# Patient Record
Sex: Male | Born: 1970 | Race: Black or African American | Hispanic: No | Marital: Single | Smoking: Never smoker
Health system: Southern US, Community
[De-identification: ages and names within clinical notes are randomized; demographics above are authoritative.]

## PROBLEM LIST (undated history)

## (undated) DIAGNOSIS — Z789 Other specified health status: Secondary | ICD-10-CM

## (undated) DIAGNOSIS — K219 Gastro-esophageal reflux disease without esophagitis: Secondary | ICD-10-CM

## (undated) DIAGNOSIS — I4891 Unspecified atrial fibrillation: Secondary | ICD-10-CM

## (undated) DIAGNOSIS — I10 Essential (primary) hypertension: Secondary | ICD-10-CM

## (undated) DIAGNOSIS — N186 End stage renal disease: Secondary | ICD-10-CM

## (undated) DIAGNOSIS — I480 Paroxysmal atrial fibrillation: Secondary | ICD-10-CM

## (undated) DIAGNOSIS — H9190 Unspecified hearing loss, unspecified ear: Secondary | ICD-10-CM

## (undated) DIAGNOSIS — Z992 Dependence on renal dialysis: Secondary | ICD-10-CM

## (undated) DIAGNOSIS — D649 Anemia, unspecified: Secondary | ICD-10-CM

## (undated) DIAGNOSIS — K5900 Constipation, unspecified: Secondary | ICD-10-CM

## (undated) DIAGNOSIS — N289 Disorder of kidney and ureter, unspecified: Secondary | ICD-10-CM

## (undated) DIAGNOSIS — I499 Cardiac arrhythmia, unspecified: Secondary | ICD-10-CM

## (undated) DIAGNOSIS — R0600 Dyspnea, unspecified: Secondary | ICD-10-CM

## (undated) HISTORY — PX: OTHER SURGICAL HISTORY: SHX169

## (undated) HISTORY — PX: VASCULAR SURGERY: SHX849

---

## 2000-09-22 ENCOUNTER — Encounter: Payer: Self-pay | Admitting: Emergency Medicine

## 2000-09-22 ENCOUNTER — Inpatient Hospital Stay (HOSPITAL_COMMUNITY): Admission: EM | Admit: 2000-09-22 | Discharge: 2000-09-25 | Payer: Self-pay | Admitting: *Deleted

## 2000-09-23 ENCOUNTER — Encounter: Payer: Self-pay | Admitting: *Deleted

## 2000-09-24 ENCOUNTER — Encounter: Payer: Self-pay | Admitting: *Deleted

## 2000-09-25 ENCOUNTER — Encounter: Payer: Self-pay | Admitting: *Deleted

## 2000-10-05 ENCOUNTER — Ambulatory Visit (HOSPITAL_COMMUNITY): Admission: RE | Admit: 2000-10-05 | Discharge: 2000-10-05 | Payer: Self-pay | Admitting: Urology

## 2000-10-05 ENCOUNTER — Encounter: Payer: Self-pay | Admitting: Urology

## 2000-10-28 ENCOUNTER — Emergency Department (HOSPITAL_COMMUNITY): Admission: EM | Admit: 2000-10-28 | Discharge: 2000-10-28 | Payer: Self-pay | Admitting: Emergency Medicine

## 2000-12-12 ENCOUNTER — Encounter: Payer: Self-pay | Admitting: Urology

## 2000-12-12 ENCOUNTER — Ambulatory Visit (HOSPITAL_COMMUNITY): Admission: RE | Admit: 2000-12-12 | Discharge: 2000-12-12 | Payer: Self-pay | Admitting: Urology

## 2001-01-04 ENCOUNTER — Encounter: Payer: Self-pay | Admitting: Urology

## 2001-01-04 ENCOUNTER — Ambulatory Visit (HOSPITAL_COMMUNITY): Admission: RE | Admit: 2001-01-04 | Discharge: 2001-01-04 | Payer: Self-pay | Admitting: Urology

## 2001-04-01 ENCOUNTER — Encounter: Payer: Self-pay | Admitting: Urology

## 2001-04-01 ENCOUNTER — Ambulatory Visit (HOSPITAL_COMMUNITY): Admission: RE | Admit: 2001-04-01 | Discharge: 2001-04-01 | Payer: Self-pay | Admitting: Urology

## 2002-08-09 ENCOUNTER — Encounter (HOSPITAL_COMMUNITY): Admission: RE | Admit: 2002-08-09 | Discharge: 2002-11-07 | Payer: Self-pay | Admitting: Nephrology

## 2002-11-09 ENCOUNTER — Encounter (HOSPITAL_COMMUNITY): Admission: RE | Admit: 2002-11-09 | Discharge: 2003-02-07 | Payer: Self-pay | Admitting: Nephrology

## 2002-11-17 ENCOUNTER — Emergency Department (HOSPITAL_COMMUNITY): Admission: EM | Admit: 2002-11-17 | Discharge: 2002-11-17 | Payer: Self-pay | Admitting: Emergency Medicine

## 2002-11-17 ENCOUNTER — Encounter: Payer: Self-pay | Admitting: Emergency Medicine

## 2003-02-02 ENCOUNTER — Encounter: Payer: Self-pay | Admitting: Emergency Medicine

## 2003-02-02 ENCOUNTER — Emergency Department (HOSPITAL_COMMUNITY): Admission: EM | Admit: 2003-02-02 | Discharge: 2003-02-03 | Payer: Self-pay | Admitting: Emergency Medicine

## 2003-02-15 ENCOUNTER — Encounter (HOSPITAL_COMMUNITY): Admission: RE | Admit: 2003-02-15 | Discharge: 2003-05-16 | Payer: Self-pay | Admitting: Nephrology

## 2003-05-17 ENCOUNTER — Encounter (HOSPITAL_COMMUNITY): Admission: RE | Admit: 2003-05-17 | Discharge: 2003-08-15 | Payer: Self-pay | Admitting: Nephrology

## 2003-08-21 ENCOUNTER — Emergency Department (HOSPITAL_COMMUNITY): Admission: EM | Admit: 2003-08-21 | Discharge: 2003-08-22 | Payer: Self-pay | Admitting: Emergency Medicine

## 2003-10-17 ENCOUNTER — Encounter (HOSPITAL_COMMUNITY): Admission: RE | Admit: 2003-10-17 | Discharge: 2004-01-15 | Payer: Self-pay | Admitting: Nephrology

## 2004-05-03 ENCOUNTER — Emergency Department (HOSPITAL_COMMUNITY): Admission: EM | Admit: 2004-05-03 | Discharge: 2004-05-03 | Payer: Self-pay

## 2004-07-19 ENCOUNTER — Encounter (HOSPITAL_COMMUNITY): Admission: RE | Admit: 2004-07-19 | Discharge: 2004-10-17 | Payer: Self-pay | Admitting: Nephrology

## 2004-10-18 ENCOUNTER — Encounter (HOSPITAL_COMMUNITY): Admission: RE | Admit: 2004-10-18 | Discharge: 2005-01-16 | Payer: Self-pay | Admitting: Nephrology

## 2005-01-24 ENCOUNTER — Encounter (HOSPITAL_COMMUNITY): Admission: RE | Admit: 2005-01-24 | Discharge: 2005-04-24 | Payer: Self-pay | Admitting: Nephrology

## 2005-07-25 ENCOUNTER — Encounter (HOSPITAL_COMMUNITY): Admission: RE | Admit: 2005-07-25 | Discharge: 2005-10-23 | Payer: Self-pay | Admitting: Nephrology

## 2005-08-29 ENCOUNTER — Emergency Department (HOSPITAL_COMMUNITY): Admission: EM | Admit: 2005-08-29 | Discharge: 2005-08-29 | Payer: Self-pay | Admitting: Emergency Medicine

## 2005-10-31 ENCOUNTER — Encounter (HOSPITAL_COMMUNITY): Admission: RE | Admit: 2005-10-31 | Discharge: 2006-01-29 | Payer: Self-pay | Admitting: Nephrology

## 2005-12-06 ENCOUNTER — Emergency Department (HOSPITAL_COMMUNITY): Admission: EM | Admit: 2005-12-06 | Discharge: 2005-12-06 | Payer: Self-pay | Admitting: Emergency Medicine

## 2006-01-06 ENCOUNTER — Ambulatory Visit: Payer: Self-pay | Admitting: Family Medicine

## 2006-01-06 ENCOUNTER — Inpatient Hospital Stay (HOSPITAL_COMMUNITY): Admission: EM | Admit: 2006-01-06 | Discharge: 2006-01-15 | Payer: Self-pay | Admitting: Emergency Medicine

## 2006-01-08 ENCOUNTER — Encounter: Payer: Self-pay | Admitting: Vascular Surgery

## 2006-01-29 ENCOUNTER — Emergency Department (HOSPITAL_COMMUNITY): Admission: EM | Admit: 2006-01-29 | Discharge: 2006-01-30 | Payer: Self-pay | Admitting: *Deleted

## 2007-01-03 ENCOUNTER — Emergency Department (HOSPITAL_COMMUNITY): Admission: EM | Admit: 2007-01-03 | Discharge: 2007-01-03 | Payer: Self-pay | Admitting: *Deleted

## 2007-03-19 ENCOUNTER — Emergency Department (HOSPITAL_COMMUNITY): Admission: EM | Admit: 2007-03-19 | Discharge: 2007-03-19 | Payer: Self-pay | Admitting: Emergency Medicine

## 2007-10-01 ENCOUNTER — Emergency Department (HOSPITAL_COMMUNITY): Admission: EM | Admit: 2007-10-01 | Discharge: 2007-10-02 | Payer: Self-pay | Admitting: Emergency Medicine

## 2008-10-23 ENCOUNTER — Emergency Department (HOSPITAL_COMMUNITY): Admission: EM | Admit: 2008-10-23 | Discharge: 2008-10-23 | Payer: Self-pay | Admitting: Emergency Medicine

## 2010-08-03 ENCOUNTER — Emergency Department (HOSPITAL_COMMUNITY)
Admission: EM | Admit: 2010-08-03 | Discharge: 2010-08-03 | Payer: Self-pay | Source: Home / Self Care | Admitting: Emergency Medicine

## 2010-08-05 LAB — COMPREHENSIVE METABOLIC PANEL
ALT: 31 U/L (ref 0–53)
AST: 22 U/L (ref 0–37)
Albumin: 3.9 g/dL (ref 3.5–5.2)
Alkaline Phosphatase: 55 U/L (ref 39–117)
BUN: 66 mg/dL — ABNORMAL HIGH (ref 6–23)
CO2: 23 mEq/L (ref 19–32)
Calcium: 10 mg/dL (ref 8.4–10.5)
Chloride: 97 mEq/L (ref 96–112)
Creatinine, Ser: 19.58 mg/dL — ABNORMAL HIGH (ref 0.4–1.5)
GFR calc Af Amer: 3 mL/min — ABNORMAL LOW (ref >60)
GFR calc non Af Amer: 3 mL/min — ABNORMAL LOW (ref >60)
Glucose, Bld: 113 mg/dL — ABNORMAL HIGH (ref 70–99)
Potassium: 5.2 mEq/L — ABNORMAL HIGH (ref 3.5–5.1)
Sodium: 136 mEq/L (ref 135–145)
Total Bilirubin: 0.8 mg/dL (ref 0.3–1.2)
Total Protein: 7.3 g/dL (ref 6.0–8.3)

## 2010-08-05 LAB — DIFFERENTIAL
Basophils Absolute: 0 10*3/uL (ref 0.0–0.1)
Basophils Relative: 0 % (ref 0–1)
Eosinophils Absolute: 0.1 10*3/uL (ref 0.0–0.7)
Eosinophils Relative: 1 % (ref 0–5)
Lymphocytes Relative: 8 % — ABNORMAL LOW (ref 12–46)
Lymphs Abs: 0.8 10*3/uL (ref 0.7–4.0)
Monocytes Absolute: 0.7 10*3/uL (ref 0.1–1.0)
Monocytes Relative: 7 % (ref 3–12)
Neutro Abs: 7.9 10*3/uL — ABNORMAL HIGH (ref 1.7–7.7)
Neutrophils Relative %: 84 % — ABNORMAL HIGH (ref 43–77)

## 2010-08-05 LAB — CBC
HCT: 30.5 % — ABNORMAL LOW (ref 39.0–52.0)
Hemoglobin: 9.6 g/dL — ABNORMAL LOW (ref 13.0–17.0)
MCH: 27.1 pg (ref 26.0–34.0)
MCHC: 31.5 g/dL (ref 30.0–36.0)
MCV: 86.2 fL (ref 78.0–100.0)
Platelets: 170 10*3/uL (ref 150–400)
RBC: 3.54 MIL/uL — ABNORMAL LOW (ref 4.22–5.81)
RDW: 13.5 % (ref 11.5–15.5)
WBC: 9.5 10*3/uL (ref 4.0–10.5)

## 2010-12-06 NOTE — Discharge Summary (Signed)
[place]  Patient:    John Woodard, John Woodard                         MRN: [id number] Adm. Date:  [id number] Disc. Date: [id number] Attending:  Hanley Ben CC:         Janit Pagan., M.D.   Discharge Summary  DISCHARGE DIAGNOSES: 1. Urinary retention. 2. Bilateral hydronephrosis. 3. Renal insufficiency. 4. Hematuria. 5. Neurogenic bladder.  PROCEDURE DONE:  Cystoscopy and insertion of suprapubic _cystocath on September 25, 2000.  HISTORY OF PRESENT ILLNESS:  Patient is a 40 year old male who was seen by Dr. Leory Plowman and he had a TUR of median bar in 1998.  For the past few days, he had been complaining of increasing abdominal pain with hematuria and nausea. Patient is mildly mentally retarded.  He has a #2440 catheter in the bladder that drained 800 cc of bloody urine at insertion.  A CT scan of the abdomen and pelvis and renal ultrasound showed bilateral hydronephrosis with thinning of the renal cortex, left worse than right.  Patients creatinine on September 21, 2000 was 3.9 and it has gone down now to 2.5 on September 24, 2000.  PHYSICAL EXAMINATION:  ABDOMEN:  His abdomen is soft, nondistended, no CVA tenderness.  Liver, spleen and kidneys not palpable.  GENITAL:  Penis is circumcised and meatus is normal.  Testicles are descended. Cord structures and epididymis are normal.  EXTREMITIES:  He has weakness and spasticity of his lower extremities at times.  RECTAL:  His sphincter tone is normal, his prostate is smooth, flat, and nontender.  LABORATORY DATA:  His BUN on admission was 24, creatinine was 3.9; with catheter drainage his BUN went down to 22 and creatinine 2.5.  His potassium was 5.1 and went down to 3.9.  Glucose was 100 on admission but was 124 on September 24, 2000.  His urinalysis showed 0-5 wbcs and few bacteria.  It is nitrite positive and had large amount of blood.  Urine culture showed greater than 100,000 colonies of viridans  Streptococcus.  HOSPITAL COURSE:  Patient was treated with Cipro.  His urine cleared up.  On September 25, 2000, he had a cystoscopy that showed no evidence of urethral stricture of bladder neck obstruction.  A suprapubic catheter was then inserted in the bladder.  Patient was then discharged home on Cipro 250 mg p.o. twice a day and he will be followed as an outpatient.  Urodynamics will be done to find out the cause of his neurogenic bladder.  He will also be evaluated by neurology.  CONDITION ON DISCHARGE:  Improved.  DISCHARGE DIET:  Regular.  ACTIVITIES:  Patient may resume his prehospital activities. DD:  09/25/00 TD:  09/27/00 Job: [id number]

## 2010-12-06 NOTE — Op Note (Signed)
[facility]  Patient:    John Woodard, John Woodard Visit Number: [id number] MRN: [id number]          Service Type: DSU Location: DAY Attending Physician:  Hanley Ben Proc. Date: 04/01/01 Admit Date:  04/01/2001                             Operative Report  PREOPERATIVE DIAGNOSES:  Bilateral hydronephrosis, neurogenic bladder, urethral stricture.  POSTOPERATIVE DIAGNOSES:  Bilateral hydronephrosis, neurogenic bladder, urethral stricture.  PROCEDURE DONE:  Cystoscopy, replacement of suprapubic Microvasive catheter, replacement of bilateral double-J catheter, visual urethrotomy, and insertion of UroLume prosthesis.  SURGEON:  Hanley Ben, M.D.  ANESTHESIA:  General.  INDICATION:  The patient is a 40 year old male, who has bilateral hydronephrosis secondary to neurogenic bladder.  He also has the urethral stricture that was diagnosed at the last cystoscopy.  He had a suprapubic catheter inserted for neurogenic bladder and bilateral double-J catheters for bilateral hydronephrosis.  He is scheduled today for replacement of the suprapubic catheter, the double-J catheters, and insertion of a UroLume prosthesis.  DESCRIPTION OF PROCEDURE:  Under general anesthesia, the patient was prepped and draped and placed in the dorsal lithotomy position.  A #22 Wappler cystoscope was inserted in the urethra.  There is a stricture in the bulbous urethra, and the cystoscope could not be passed in the bladder.  The cystoscope was then removed.  A visual urethrotome was then passed in the urethra, and the stricture was incised, and the urethrotome could be passed in the bladder.  The urethrotome was then removed.  A #22 cystoscope was then passed in the bladder.  There is a double-J catheter coming out of each ureteral orifice.  The right double-J catheter was pulled out of the ureter with the grasping forceps, and both catheters came out at the same time.   A guidewire was then passed through the right double-J catheter, but the proximal end of both catheters were in the bladder.  The catheters were then removed.  The cystoscope was reinserted in the bladder.  A Glidewire was passed through an open-end catheter and passed in the ureter.  The open-end catheter was then removed.  A #6 [place] - 26 double-J catheter was passed over the Glidewire into the right ureter.  The same procedure was done on the left side.  The proximal end of the double-J catheter is in the collecting system of each kidney.  Then the [place] catheter was replaced with a #14 [place] Microvasive catheter.  The bladder was then emptied and the cystoscope removed.  The urethra was then dilated up to a #28 [place].  Then the cystoscope was reinserted in the bladder.  The stricture was found to be about 1.5 cm long.  Then a 3 cm UroLume prosthesis instrument was then passed in the urethra, and the prosthesis was then inserted in the urethra distal to the external sphincter and in normal tissues.  The UroLume instrument was then removed.  A #17 cystoscope was then passed in the urethra through the prosthesis without difficulty and into the bladder.  The bladder was then emptied, and the cystoscope was removed.  The prosthesis appears to be in a good location, and each end of the prosthesis is in normal, healthy tissue.  The patient tolerated the procedure well and left the OR in satisfactory condition to postanesthesia care unit. Attending Physician:  Hanley Ben DD:  04/01/01 TD:  04/01/01 Job: [id number]  [id number]

## 2010-12-06 NOTE — Consult Note (Signed)
NAME:  John Woodard, John Woodard NO.:  [id number]   MEDICAL RECORD NO.:  [id number]          PATIENT TYPE:  INP   LOCATION:  5501                         FACILITY:  [facility]   PHYSICIAN:  Felizardo Hoffmann, M.D.  DATE OF BIRTH:  16-Sep-1970   DATE OF CONSULTATION:  01/14/2006  DATE OF DISCHARGE:                                   CONSULTATION   REASON FOR PRIORITY:  The patient is requiring disposition from the hospital  today.   Mr. Struss reports mild improvement in his mood.  His outlook is positive.  He  is able to enjoy some things.  He is not having any adverse Lexapro effects.  There are no auditory or visual hallucinations, no delusions, no thoughts of  harming himself, no thoughts of harming others.   LABORATORY DATA:  CBC is remarkable for his decreased hemoglobin of 9.5, BUN  82, creatinine 10.7.  The patient is continuing with dialysis.   MEDICATIONS:  Reviewed.  The psychotropic on the list is Lexapro at 5 mg  q.a.m.   PHYSICAL EXAMINATION:  VITAL SIGNS:  Temperature 98.3, pulse 82, respiration  18, blood pressure 125/71, O2 saturation 97% on room air.   MENTAL STATUS EXAM:  John Woodard initially does not recognize me, and over the  course of several minutes, he does recall that I had to ask him previously  about his mood and about his depression.  He did not recall that I had  started him on an anti-depressant.  He is oriented to all spheres.  His  speech is slightly slowed and of flat prosody.  He does have a pleasant  social smile, and he is cooperative with 50% eye contact.  Thought process  is coherent.  Though content:  No thoughts of harming himself.  No thoughts  of harming others.  No hallucinations, no delusions.  His fund of knowledge  and intelligence continue stable at an estimated IQ of approximately 12.  He  continues to have a concrete operations level view of healthcare treatment.  He recognizes the need for the treatment of acutely painful  situations.   JUDGMENT:  John Woodard lacks the ability to appreciate his general medical  problems and the long-term potential morbidity and mortality that can occur  with them.  He lacks the ability to appreciate that he needs ongoing  medication, and this is a position that continues to be stable, despite  several days in the hospital.   His insight is partially intact to his depression and the associated  symptoms.  His concentration is mildly decreased with some distractibility.   ASSESSMENT:  Axis I:  Mood disorder, not otherwise specified, 293.83  (history of functional oppression in the context of Axis III illness that  can undermine energy).  Major depressive disorder, recurrent, in partial  remission (provisional).  Axis II:  MR.  Axis III:  See general medical problems list.  Axis IV:  General medical and primary support group.  Axis V:  Stable at 45.   The patient's lack of capacity for long-term independent self care is stable  and secondary to his MR.   RECOMMENDATIONS:  1.  It is recommended that he live in an environment where someone is with      him that can help him maintain his prophylactic medical treatment.  2.  Please ask the case manager to set Mr. Sotto up with psychiatric      outpatient followup.  Options include the [facility], __________  [facility]      and [facility].  Another option      would be to call [phone] and ask the intake department what other      community options are out there with the patient's insurance panel.  3.  Would increase the Lexapro to 10 mg p.o. q.a.m. for his anti-depression      trial.      Felizardo Hoffmann, M.D.  Electronically Signed     JW/MEDQ  D:  01/14/2006  T:  01/14/2006  Job:  [id number]

## 2010-12-06 NOTE — Discharge Summary (Signed)
John Woodard, [name] NO.:  [id number]   MEDICAL RECORD NO.:  [id number]          PATIENT TYPE:  INP   LOCATION:  5501                         FACILITY:  [facility]   PHYSICIAN:  John Chol, MD DATE OF BIRTH:  June 26, 1971   DATE OF ADMISSION:  01/06/2006  DATE OF DISCHARGE:  01/14/2006                                 DISCHARGE SUMMARY   DISCHARGE DIAGNOSES:  1.  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  2.  Suprapubic catheter.  3.  Urinary tract infection.  4.  Status post arteriovenous fistula placement.  5.  Mental retardation.  6.  Hypertension.  7.  Secondary hyperparathyroidism.  8.  Hyperphosphatemia.  9.  Anemia, iron deficiency.  10. Depression.  11. Nephrotic syndrome.  12. Neurogenic bladder.   PROCEDURES:  1.  Diatek insertion on January 07, 2006.  2.  AV fistula on January 09, 2006.  3.  Chest x-ray after Diatek placement showing appropriate placement but      possible pneumothorax.  4.  Follow-up chest x-ray showing no pneumothorax.  5.  Renal ultrasound showing echogenic kidneys and thickening of the bladder      walls.  6.  Head CT showing no intracranial abnormality.   LABORATORY RESULTS:  Creatinine on admission was 12.7.  His potassium at  that time was also 4.8.  Albumin 2.8, calcium 8.9, phosphorus at discharge  6.9, hemoglobin 9.5.  Discharge iron studies included iron of 42, TIBC of  333, percent sat of 13.  Urine culture showed Enterobacter cloacae that was  Cipro-sensitive.  CK was 319.  Hepatitis B was negative.  Liver function  tests were all normal.   DISCHARGE MEDICATIONS:  1.  Micardis 80 mg, one tab before bed.  2.  Lexapro 10 mg, one tab daily.  3.  Norvasc 10 mg, one tab before bed.  4.  Cipro 250 mg, one tab daily until January 18, 2006.  5.  _____, one _____.  6.  Nephro-Vite, one tab daily.  7.  Aranesp 200 mcg at dialysis on Wednesday.  8.  Hectorol 2 mcg Monday, Wednesday,  and Friday with dialysis IV.  9.  PhosLo _____ mg by mouth three times daily.  10. Iron dextran 100 mg Monday, Wednesday, and Friday with dialysis IV.  11. [employer] dietary supplement three times daily.  12. Percocet 5/325 mg, one tab every four hours as needed.   HOSPITAL COURSE:  A 40 year old [place] American male with chronic renal  failure secondary to obstructive process and chronic suprapubic Foley  catheter who presented in acute on chronic renal failure.  Renal was  consulted, and it was determined that the patient had become end-stage renal  disease, and he was started on hemodialysis.  The patient had a Diatek  catheter inserted on January 07, 2006 and an AV fistula created on January 09, 2006.  He was found to be hyperphosphatemic and to have secondary  hyperparathyroidism, so was started on PhosLo and Nephro-Vite and Hectorol  and iron dextran three times a  week with dialysis IV.  He gets Aranesp for  iron-deficiency anemia and renal-related anemia on Wednesdays, 200 mcg.  He  was also found to have a urinary tract infection.  He had been complaining  of some abdominal symptoms and some pressure and some leaking.  His catheter  was replaced, and immediate cath sample grew out Enterobacter cloacae as  above.  It was sensitive to Cipro.  The patient's blood pressure was managed  with Micardis and Norvasc and was still under poor control and will probably  need addition of beta blocker or other medication in the outpatient setting.  The patient was evaluated by Dr. Rhona Woodard with psychiatry and determined to  not have capacity for long-term decision-making.  After speaking with the  family, it was determined that the family members did not want to take the  responsibility for long-term decisions for him, and they also felt that they  would be able to come together and help him out to improve his ability to  care for himself.  He will be discharged home to his brother.  The  patient  has had arrangements for a ride to [facility], where he has to go to get  dialysis, since [facility] does not take his insurance.   FOLLOW-UP:  1.  The patient will follow up with Dr. Hassell Done at [facility] on January 22, 2006 at 4:30 p.m.  The patient will also call for appointment with ____      Center for follow-up.  He will need to be sure to make his hemodialysis      appointments on Monday, Wednesday, and Friday and make the rides as      scheduled by the social worker here in the hospital.  2.  Hypertension:  The patient will probably need addition of      antihypertensive medications in the outpatient setting.  3.  Depression.  The patient was diagnosed with depression by Dr. Rhona Woodard      as well in the hospital and started on Lexapro 10 mg, one tab daily.      His response to this medication should also be followed up in the      outpatient setting.      John Woodard, M.D.    ______________________________  John Chol, MD    AL/MEDQ  D:  01/14/2006  T:  01/14/2006  Job:  [id number]   cc:   John Woodard, M.D.  [address]  [place]  [place] [address]   [facility]   John Artis, MD   John Woodard, M.D.   [place]

## 2010-12-06 NOTE — Op Note (Signed)
John Woodard, John Woodard                ACCOUNT NO.:  [id number]   MEDICAL RECORD NO.:  [id number]          PATIENT TYPE:  INP   LOCATION:  3028                         FACILITY:  [facility]   PHYSICIAN:  James L. Deterding, M.D.DATE OF BIRTH:  Jan 19, 1971   DATE OF PROCEDURE:  DATE OF DISCHARGE:                                 OPERATIVE REPORT   PROCEDURE:  Diatek permanent catheter insertion.   INDICATIONS:  Access for hemodialysis.   The procedure was explained to the patient and was understood and accepted.  The patient was taken to the fluoroscopy suite and placed on the fluoroscopy  table in supine position with a towel roll between the shoulders.  Using the  right ultrasound device, the right internal jugular vein is located in the  middle one-third of the sternocleidomastoid triangle to insure its patency,  competency, depth, and position.  Subsequently, the right side of the neck  and right upper chest were prepped with Betadine.  Xylocaine 2% local  anesthesia was used to numb up the area over the sternocleidomastoid  triangle and in an inferolateral fashion, approximately 8 x 4 cm.  Using the  right ultrasound device in the sternal manner, the internal jugular vein was  cannulated with an 18 gauge thin-walled needle and a J-tip guidewire  advanced over an 18 gauge thin-walled needle through the internal jugular  vein, superior vena cava, and right atrium under fluoroscopic guidance.  The  18 gauge thin-walled needle, and it entered the point where the guidewire  __________  approximately 0.5 cm.  Serial dilators were placed in over the  guidewire, starting with a 10 [place], then going with a 12 and 14 [place],  lesser resistance in the skin, subcutaneous tissue, and vessel wall.  Subsequently, a 41 French trocar and tear-away sheath were placed over the  guidewire and advanced into the internal jugular vein and superior vena cava  under fluoroscopic guidance.  A nonserrated dialysis  clamp was placed around  the trocar and sheath just outside the skin.  Both lumens of a double-lumen  24 cm Diatek catheter was flushed with saline distally and clamped.  The  trocar left in was then withdrawn from the sheath, and the sheath clamped  for dialysis.  The proximal ends of the Diatek catheter started through the  distal end of the sheath with the venous lumen being medial.  The sheath was  then unclamped.  The catheter was advanced through the remainder of the  sheath into the internal jugular vein and superior vena cava, and the sheath  removed and position confirmed the tips of the superior vena cava and right  atrial junction.  A 0.5 cm stab wound was made 3 cm inferior at the  clavicle, approximately 7 cm inferolateral at the vein entry.  Through the  distal stab wound, a blunt tunnel device was advanced in a curvilinear  manner up to and through the vein entry opening.  Subsequently, the distal  end of the catheter was attached to the proximal end of the tunneling device  and pulled through the tunnel with the  Dacron cuff approximately 4 cm from  the tunnel exit site.  Position was confirmed.  There were no kinks in the  catheter. The catheter was then clamped, cut off, and a friction screw-on  __________  device was used to attach the wide connector.  The catheter was  then unclamped.  The catheter flushed with saline, then concentrated with  heparin, heparin locked.  The skin over the vein entry was closed with 3-0  silk, and the two ends of the catheter were secured with 3-0 silk also and  Hypafix dressing applied.           ______________________________  Joyice Faster Deterding, M.D.     JLD/MEDQ  D:  01/07/2006  T:  01/07/2006  Job:  [id number]

## 2010-12-06 NOTE — Op Note (Signed)
[facility]  Patient:    John Woodard, John Woodard                         MRN: [id number] Proc. Date: 01/04/01 Adm. Date:  [place]:[id number] Disc. Date: [place]:[id number] Attending:  Tor Netters CC:         Alyson Locket. Love, M.D.   Operative Report  PREOPERATIVE DIAGNOSIS:  Bilateral hydronephrosis, status post transurethral resection bladder neck.  POSTOPERATIVE DIAGNOSIS:  Bilateral hydronephrosis, status post transurethral resection bladder neck.  PROCEDURE:  Cystoscopy, visual urethrotomy, bilateral retrograde pyelogram, and insertion of double-J catheter.  SURGEON:  Hanley Ben, M.D.  ANESTHESIA:  General.  INDICATION:  The patient is a 40 year old male, who had a TUR incision of the bladder neck about four years ago for urinary retention and bilateral hydronephrosis by Dr. Leory Plowman.  Apparently the patient was lost to follow-up and returned this year with urinary retention.  A renal ultrasound showed bilateral hydronephrosis, left greater than right.  Cystoscopy showed that the bladder neck was open.  Because of the patients difficulty to urinate, a suprapubic cystocath was inserted.  The patient was evaluated by Dr. Morene Antu, who found no neurologic abnormality.  A repeat renal ultrasound showed increased hydronephrosis.  The patient is scheduled today for a cystoscopy, bilateral retrograde pyelogram, and insertion of double-J catheter.  DESCRIPTION OF PROCEDURE:  Under general anesthesia, the patient was prepped and draped and placed in the dorsal lithotomy position.  A #21 Wappler cystoscope was inserted in the urethra.  There is a suture in the bulbous urethra, and the cystoscope could not be passed into the bladder.  The cystoscope was removed.  A visual urethrotome was then inserted in the urethra and the suture was incised, and the urethrotome was inserted in the bladder. The urethrotome was then removed.  The cystoscope was then inserted in  the bladder.  The bladder neck is open.  There is no stone or tumor in the bladder.  The ureteral orifices are laterally placed.  A cone-tip catheter was then passed through the cystoscope into the right ureteral orifice.  Contrast was then injected through the cone-tip catheter.  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  The cone-tip catheter was then removed, and a Glidewire could be passed in the ureter because of the J-hook deformity.  The cystoscope was then removed.  A ureteroscope was then passed in the bladder and the distal ureter, and a Glidewire was passed through the ureteroscope into the ureter.  The ureteroscope was then removed.  An open-end catheter was then passed over the Glidewire, and the Glidewire was removed and replaced with a guidewire.  The guidewire was then backloaded into the cystoscope, and a #6 [place] 26 double-J catheter was passed over the guidewire with the proximal end of the double-J catheter in the collecting system and the distal end in the bladder.  Then the guidewire was removed.  The cone-tip catheter was then passed through the cystoscope into the left ureteral orifice.  Contrast was then injected through the cone-tip catheter.  The same J-hook deformity of the distal ureter is noted and again there is a kink in the proximal ureter.  The ureter appears otherwise normal.  The collecting system is markedly dilated.  A Glidewire could be passed through the ureter. The cystoscope was then removed.  A ureteroscope was  passed then without difficulty in the distal ureter, and a Glidewire was passed through the ureteroscope into the collecting system.  The ureteroscope was then removed. An open-end catheter was then passed over the Glidewire, and the Glidewire was removed and replaced with a guidewire.  The open-end catheter was then removed.  The guidewire  was then backloaded into the cystoscope, and a #6-26 double-J catheter was passed over the guidewire.  The proximal end of the double-J catheter is in the collecting system.  The distal end was in the bladder.  The bladder was then emptied and the cystoscope and guidewire were removed.  The patient tolerated the procedure well and left the OR in satisfactory condition to postanesthesia care unit. DD:  01/04/01 TD:  01/04/01 Job: [id number]

## 2010-12-06 NOTE — Consult Note (Signed)
NAMEESTELLE, HERSTON NO.:  [id number]   MEDICAL RECORD NO.:  [id number]          PATIENT TYPE:  INP   LOCATION:  3028                         FACILITY:  [facility]   PHYSICIAN:  Felizardo Hoffmann, M.D.  DATE OF BIRTH:  08/10/1970   DATE OF CONSULTATION:  01/08/2006  DATE OF DISCHARGE:                                   CONSULTATION   REFERRING PHYSICIAN:  Jamal Collin. Andria Frames, M.D.   REASON FOR CONSULTATION:  Capacity and mood assessment.   HISTORY OF PRESENT ILLNESS:  Mr. Coney is a 40 year old male admitted to  [facility] on the January 06, 2006 for acute renal failure.   The patient reports several weeks of depressed mood, decreased energy,  occasional passive thoughts of harming himself without plan or intent.  He  also has some insomnia.  There are no hallucinations, no delusions, no  thoughts of harming others.  His memory is intact to immediate, recent and  remote.   The patient is not combative.  He is oriented.  He does understand his need  for dialysis.   PAST PSYCHIATRIC HISTORY:  The patient states that he has had periods of  depression all my life.  He states he did try to harm himself back in  1998.  He was never psychiatrically hospitalized.  He states that he was  given some nerve medications but he does not remember what that was.  He  has no known history of mania, no history of hallucinations or delusions.   FAMILY PSYCHIATRIC HISTORY:  None known.   SOCIAL HISTORY:  The patient has two male siblings and one male sibling.  He was born in [place] and moved [place] when he was 40 years old.  He  states that his parents divorced.  His father was a [employer].  After they divorced, he had rare contact with his father.  The patient  states that he went through special school up to the 5th grade, tried to go  mainstream in school and it is unclear whether he graduated from a special  school or a regular high school but he states  that he did graduate from high  school.   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  The patient denies  any alcohol or illegal drugs.  He currently lives with one of his siblings.   He has no children.  He is single.  Religion [employer].   GENERAL MEDICAL PROBLEMS:  Acute renal failure with a need for ongoing  dialysis.  History of bladder outlet obstruction and requiring a catheter at  a young age.  Bilateral renal insufficiency.   MEDICATIONS:  The MAR is reviewed.  The patient has an allergy to  PENICILLIN.  Psychotropics include 4 mg IV on call.   LABORATORY DATA:  The CBC is remarkable for a slightly decreased hemoglobin  of 10.1.  The metabolic panel is remarkable for a BUN of 109 and a  creatinine of 12.5.  A full chemistry panel this week included an SGOT of 25  and a  SGPT of 25.  Albumin is at 2.8.   CT of the head without contrast on the Dec 06, 2005 showed no evidence of  acute intracranial abnormality.   REVIEW OF SYSTEMS:  CONSTITUTIONAL:  Afebrile.  HEAD:  Normocephalic.  EYES:  No visual disturbance.  ENT:  No sore throat.  RESPIRATORY:  No cough.  CARDIOVASCULAR:  No chest pain.  The patient also has a history of  hypertension.  GI:  No nausea, vomiting, diarrhea.  GENITOURINARY:  The  patient has an indwelling Foley catheter.  MUSCULOSKELETAL:  No deformity,  weakness or atrophy.  HEMATOLOGIC:  Lymphatic as above.  NEUROLOGICAL:  Unremarkable.  PSYCHIATRIC:  The patient has clearly an unrealistic  assessment of his potential capacity.  He describes the goal of his that  would be unachievable and this reflects an overall inability to appreciate  the degree of his medical impairments and thus the necessary supportive  measures and the supportive environment.  ENDOCRINE:  Unremarkable.   PHYSICAL EXAMINATION:  VITAL SIGNS:  Temperature 97.0, pulse 69,  respirations 20, blood pressure 178/78.  Room air O2 saturation is 99.  MENTAL STATUS EXAM:  Mr. Schnider  is a pleasant middle-aged male lying in a  supine position in his hospital bed with an obvious constricted affect, good  eye contact.  Speech is showing flat prosody, slightly slowed rate.  He is  oriented to the year, the month, the day of the month, the day of the week,  place and person.  Thought process is coherent without lucencies of  association.  However, there is some alogia as described in the review of  systems psychiatric portion above.  Thought content:  No thoughts of harming  himself.  No thoughts of harming others.  No delusions.  No hallucinations.  His fund of knowledge and intelligence are less than average and estimated  grossly at an IQ of 25.  His concentration is mildly decreased.  His  judgment, see below in the discussion.  Insight is poor except for his  depression and his basic understanding of his renal condition and basic  procedures required for that.   ASSESSMENT:  AXIS I:  Mood disorder, not otherwise specified, 293.83  (history of functional depression in the context of axis III acute illness)  provisional.  Major depressive disorder recurrent, moderate to severe.  AXIS II:  MR.  AXIS III:  See general medical problems above.  AXIS IV:  General medical.  AXIS V:  45.   Please see the psychiatric section of the review of systems above.  The  patient does not have the ability to appreciate the extent of his medical  problems and the supportive environment and care required in the long-term  outpatient environment.  He does appreciate his depression and does  understand the medication discussion as described below.  Also, he  understands the condition that requires dialysis and the morbidity and  mortality that could result if he does not get dialysis.  He also has an  understanding of the risks involved in fistula placement.  Therefore, the patient does not have the capacity for informed consent regarding placement  after leaving the hospital but he does  have the capacity to consent to  antidepressant medication as well as fistula placement and dialysis.   INFORMED CONSENT:  The indications, alternatives and adverse effects of  Lexapro were discussed with the patient for antidepressant.  He would like  to try that.   RECOMMENDATIONS:  We  will start Lexapro at 5 mg p.o. q.a.m.     Felizardo Hoffmann, M.D.  Electronically Signed    JW/MEDQ  D:  01/08/2006  T:  01/09/2006  Job:  [id number]

## 2010-12-06 NOTE — Op Note (Signed)
John Woodard, John Woodard NO.:  [id number]   MEDICAL RECORD NO.:  [id number]          PATIENT TYPE:  INP   LOCATION:  3028                         FACILITY:  [facility]   PHYSICIAN:  Rosetta Posner, M.D.    DATE OF BIRTH:  1970-08-10   DATE OF PROCEDURE:  01/09/2006  DATE OF DISCHARGE:                                 OPERATIVE REPORT   PREOPERATIVE DIAGNOSIS:  End-stage renal disease.   POSTOPERATIVE DIAGNOSIS:  End-stage renal disease.   PROCEDURE:  Left AV fistula creation, Cimino.   SURGEON:  Dr. Sherren Mocha Early.   ASSISTANT:  Suzzanne Cloud, PA-C.   ANESTHESIA:  MAC.   COMPLICATIONS:  None.   DISPOSITION:  To recovery room stable.   PROCEDURE:  The patient was taken to the operating room and placed in the  supine position.  The area of left arm was prepped and draped in the usual  sterile fashion.  Using local anesthesia, an incision was made between the  level of the cephalic vein and the radial artery at the wrist.  The cephalic  vein was mobilized.  It was gently dilated and was of excellent caliber.  This was ligated distally and divided.  This was mobilized to the level of  the radial artery.  The radial artery was occluded proximally and distally,  and it was opened with an 11 blade __________ and Potts scissors.  The vein  was sewn end to side to the artery with a running 6-0 Prolene suture.  Clamps were removed and excellent thrill was noted.  The wounds were  irrigated with saline.  Hemostasis with electrocautery.  The wounds were  closed with 3-0 Vicryl in the subcutaneous and subcuticular tissue.  Sterile  dressing was applied.      Rosetta Posner, M.D.  Electronically Signed     TFE/MEDQ  D:  01/09/2006  T:  01/09/2006  Job:  [id number]

## 2010-12-06 NOTE — H&P (Signed)
[place]. [facility]  Patient:    John Woodard, John Woodard                         MRN: [id number] Adm. Date:  [id number] Attending:  Mittie Bodo CC:         Janit Pagan., M.D.   History and Physical  REASON FOR ADMISSION:  Blood in urine, difficulty voiding.  HISTORY OF PRESENT ILLNESS:  This 40 year old male who has seen Dr. Bud Face, Brooke Bonito. before, and has apparently had a procedure done on his bladder neck, in 1998.  The patient, over the past few days, has been having increasing abdominal pain.  He has been passing blood for the past day or so. He also has had some nausea.  He denies any fever or chills.  He denies any dysuria.  The patient is mildly mentally retarded and is not the best historian.  He denies ever having had a kidney stone.  He denies having significant history of urinary tract infections.  It sounds like he has been trying to avoid a medical contact for the past few years, but had to present today because of worsened problems with his urine.  PAST MEDICAL HISTORY:  Significant for prior urologic procedure in 1998. Other than that, he denies any regular medical history.  He apparently has multiple allergies to foods and environmental factors.  CURRENT MEDICATIONS:  None.  ALLERGIES:  No known drug allergies.  SOCIAL HISTORY:  The patient is single.  He is not sexually active.  He lives with his siblings.  He is a native of [place].  He works as a Retail buyer for [employer].  REVIEW OF SYSTEMS/FAMILY HISTORY:  Noncontributory.  PHYSICAL EXAMINATION:  GENERAL:  A healthy-appearing adult male in no distress at the time of this dictation.  VITAL SIGNS:  Blood pressure 177/100, pulse 86, respirations 20, temperature 98 degrees.  HEENT:  Normal.  NECK:  Supple without thyromegaly or adenopathy.  CHEST:  Clear bilaterally.  HEART:  A regular rate and rhythm without rub, gallop, or murmur.  ABDOMEN:  Nondistended,  nontender.  Bladder is palpable halfway up to the umbilicus and was minimally tender.  No CVA tenderness or flank masses were present.  GENITOURINARY:  Phallus circumcised, without lesions, fibrotic areas, or plaques.  Meatus was normal in location and size.  Testicles normal shape, size, and consistency, descended bilaterally.  Cords and epididymal structures normal.  No inguinal herniae noted.  EXTREMITIES:  Without cyanosis, clubbing, or edema.  LABORATORY DATA:  Glucose 95, BUN 24, creatinine 3.9, sodium 140, potassium 4.9, chloride 109, bicarbonate 27.  Hematocrit 33%.  DESCRIPTION OF PROCEDURE:  The patient was dilated to a 30-French with a combination of filiforms and followers and VanBuren sounds.  The 24-French Ainsworth catheter was placed with the help of a catheter guide.  About 800 cc of bloody urine was obtained.  The bladder was irrigated copiously free of clots.  This was hooked to dependent drainage.  IMPRESSION: 1. Hematuria. 2. Clot retention. 3. Bladder outlet obstruction. 4. Bilateral renal atrophy. 5. Renal insufficiency. 6. Hypertension.  PLAN: 1. At this point, due to the patients mental condition, I feel it is    best to admit him for observation. 2. Will culture the urine. 3. Cipro 500 mg q.d. p.o. DD:  09/22/00 TD:  09/22/00 Job: [id number]

## 2010-12-06 NOTE — Op Note (Signed)
[place]. [facility]  Patient:    John Woodard, John Woodard                         MRN: [id number] Proc. Date: 09/25/00 Adm. Date:  [id number] Disc. Date: [id number] Attending:  Hanley Ben CC:         Janit Pagan., M.D.   Operative Report  PREOPERATIVE DIAGNOSES:  Neurogenic bladder, bilateral hydronephrosis, and renal insufficiency.  PROCEDURE:  Cystoscopy and insertion of suprapubic Cystocath.  SURGEON:  Hanley Ben, M.D.  ANESTHESIA:  General.  INDICATION:  The patient is a 40 year old male, who was admitted with abdominal pain, difficulty voiding, and hematuria.  He had a TUR of the median bar in 1998.  The patient then had bilateral hydronephrosis, but he was lost to follow-up.  He was seen in the emergency room three days ago with the above symptoms and was admitted by Dr. Diona Fanti for Dr. Leory Plowman.  A CT scan and renal ultrasound showed bilateral hydronephrosis.  Creatinine was up to 3.4. A Foley catheter was then inserted in the bladder.  His creatinine went down to 2.5 on September 24, 2000.  At the familys request and Dr. Caesar Bookman consent, the patient was then sent to my service, and he is scheduled today for cystoscopy and insertion of suprapubic Cystocath.  DESCRIPTION OF PROCEDURE:  Under general anesthesia, the patient was prepped and draped and placed in the dorsal lithotomy position.  A #21 Wappler cystoscope was inserted in the bladder.  The urethra was normal.  The bladder neck was open.  There was no evidence of urethral stricture or bladder neck obstruction.  The bladder mucosa was reddened and edematous.  The patient had had a Foley catheter for about four days.  Those changes could be secondary to the Foley catheter.  There is no evidence of stone or tumor in the bladder. The ureteral orifices are in normal position and shape.  Then a Microvasive catheter was passed under direct vision in the bladder through the suprapubic area.   The cystoscope was then removed.  The catheter was left to straight drainage.  The patient tolerated the procedure well and left the OR in satisfactory condition to postanesthesia care unit.  The cystoscopy was done with both the full oblique the right inguinal lenses. D:  09/25/00 TD:  09/27/00 Job: [id number]

## 2010-12-06 NOTE — Consult Note (Signed)
[place]. [facility]  Patient:    John Woodard, John Woodard                         MRN: [id number] Proc. Date: 09/24/00 Adm. Date:  [id number] Attending:  Melrose Nakayama                          Consultation Report  HISTORY OF PRESENT ILLNESS:  The patient is a 40 year old male who was seen by Dr. Carin Primrose before and had a TUR of a median bar in 1998.  The patient has been complaining of increasing abdominal pain, gross hematuria, and nausea for the past few days prior to this admission on September 22, 2000.  He was admitted by Dr. Diona Fanti and he was followed by Dr. Leory Plowman.  Dr. Leory Plowman called me last night and asked me if I would be willing to accept a transfer to my service at the familys request and I have accepted to see the patient. He is mildly mentally retarded.  A Foley catheter was inserted after dilation of the urethral with filiformes and followers and [employer].  About 800 cc of bloody urine was drained out of the bladder.  A CT scan of the abdomen and pelvis and renal ultrasound showed bilateral hydronephrosis with thinning of the renal cortex, left is worse than the right side.  The patients creatinine on March 4 was 3.9.  With the Foley catheter, it is now down to 2.4.  PAST MEDICAL HISTORY:  The patient had a TUR of a median bar in 1998 and apparently he had a tube in his bladder when he was a newborn.  In 1998, he also had bilateral hydronephrosis.  ALLERGIES:  He has multiple allergies to foods.  He has no known drug allergies.  FAMILY HISTORY:  There is a family history of hypertension and diabetes.  He has one brother and two sisters.  PHYSICAL EXAMINATION:  ABDOMEN:  Soft, nondistended.  He has no CVA tenderness.  Liver, spleen, and kidneys are not palpable.  GENITALIA:  His penis is circumcised.  His meatus is normal.  His testicles are descended.  The cord structures and epididymis are normal.  EXTREMITIES:  The patient  complains of weakness and spasticity of the lower extremities at times.  RECTAL:  Sphincter tone is normal.  Prostate is smooth, flat, and nontender.  IMPRESSION: 1. Neurogenic bladder. 2. Bilateral hydronephrosis. 3. Renal insufficiency. 4. Hematuria. 5. Urinary retention. 6. Mental retardation.  LABORATORY DATA:  BUN is 22, creatinine is 2.4.  His potassium is down to 3.9. Glucose is 92.  Urine culture showed more than 100,000 colonies of viridans Streptococcus and he is currently on Cipro.  PLAN:  The plan is to do a cystoscopy and bladder biopsy if indicated and insert his suprapubic catheter in order to remove the Foley catheter and obtain a neurology consult. DD:  09/24/00 TD:  09/24/00 Job: [id number]

## 2011-04-14 LAB — CBC
HCT: 34.4 — ABNORMAL LOW
Hemoglobin: 11 — ABNORMAL LOW
MCHC: 32
MCV: 84.3
Platelets: 198
RBC: 4.07 — ABNORMAL LOW
RDW: 13.4
WBC: 5.1

## 2011-04-14 LAB — DIFFERENTIAL
Basophils Absolute: 0
Basophils Relative: 1
Eosinophils Absolute: 0.2
Eosinophils Relative: 4
Lymphocytes Relative: 40
Lymphs Abs: 2
Monocytes Absolute: 0.4
Monocytes Relative: 8
Neutro Abs: 2.3
Neutrophils Relative %: 47

## 2011-04-14 LAB — URINALYSIS, ROUTINE W REFLEX MICROSCOPIC
Bilirubin Urine: NEGATIVE
Glucose, UA: NEGATIVE
Ketones, ur: NEGATIVE
Protein, ur: >300 — AB
pH: 8

## 2011-04-14 LAB — BASIC METABOLIC PANEL
BUN: 59 — ABNORMAL HIGH
CO2: 27
Calcium: 9.2
Chloride: 103
Creatinine, Ser: 12.9 — ABNORMAL HIGH
GFR calc Af Amer: 5 — ABNORMAL LOW
GFR calc non Af Amer: 4 — ABNORMAL LOW
Glucose, Bld: 90
Potassium: 4.4
Sodium: 139

## 2011-04-14 LAB — URINE MICROSCOPIC-ADD ON

## 2011-05-02 LAB — I-STAT 8, (EC8 V) (CONVERTED LAB)
BUN: 64 — ABNORMAL HIGH
Bicarbonate: 32.9 — ABNORMAL HIGH
Glucose, Bld: 85
Hemoglobin: 15.3
Sodium: 135
TCO2: 35
pH, Ven: 7.371 — ABNORMAL HIGH

## 2011-05-02 LAB — URINE CULTURE

## 2011-05-02 LAB — DIFFERENTIAL
Basophils Absolute: 0
Eosinophils Relative: 4
Lymphocytes Relative: 42
Lymphs Abs: 2.2
Neutro Abs: 2.4
Neutrophils Relative %: 46

## 2011-05-02 LAB — URINALYSIS, ROUTINE W REFLEX MICROSCOPIC
Bilirubin Urine: NEGATIVE
Nitrite: NEGATIVE
Specific Gravity, Urine: 1.01
Urobilinogen, UA: 0.2
pH: 8.5 — ABNORMAL HIGH

## 2011-05-02 LAB — CBC
HCT: 39.6
Platelets: 184
RDW: 13.8
WBC: 5.3

## 2011-05-02 LAB — URINE MICROSCOPIC-ADD ON

## 2011-05-02 LAB — POCT I-STAT CREATININE: Operator id: 294501

## 2011-05-02 LAB — POCT CARDIAC MARKERS
CKMB, poc: <1 — ABNORMAL LOW
Myoglobin, poc: >500
Operator id: 294501
Troponin i, poc: <0.05

## 2011-05-07 LAB — URINALYSIS, ROUTINE W REFLEX MICROSCOPIC
Glucose, UA: NEGATIVE
Nitrite: NEGATIVE
Specific Gravity, Urine: 1.01
pH: 8.5 — ABNORMAL HIGH

## 2011-05-07 LAB — URINE MICROSCOPIC-ADD ON

## 2011-05-07 LAB — URINE CULTURE

## 2012-12-30 ENCOUNTER — Emergency Department (HOSPITAL_COMMUNITY)
Admission: EM | Admit: 2012-12-30 | Discharge: 2012-12-30 | Disposition: A | Discharge status: Home / Self Care | Payer: Medicare Other | Source: Home / Self Care | Attending: Emergency Medicine | Admitting: Emergency Medicine

## 2012-12-30 ENCOUNTER — Encounter (HOSPITAL_COMMUNITY): Payer: Self-pay | Admitting: Emergency Medicine

## 2012-12-30 DIAGNOSIS — T83091A Other mechanical complication of indwelling urethral catheter, initial encounter: Secondary | ICD-10-CM | POA: Insufficient documentation

## 2012-12-30 DIAGNOSIS — Z880 Allergy status to penicillin: Secondary | ICD-10-CM | POA: Insufficient documentation

## 2012-12-30 DIAGNOSIS — N99512 Cystostomy malfunction: Secondary | ICD-10-CM | POA: Insufficient documentation

## 2012-12-30 DIAGNOSIS — Z79899 Other long term (current) drug therapy: Secondary | ICD-10-CM | POA: Insufficient documentation

## 2012-12-30 DIAGNOSIS — Y846 Urinary catheterization as the cause of abnormal reaction of the patient, or of later complication, without mention of misadventure at the time of the procedure: Secondary | ICD-10-CM | POA: Insufficient documentation

## 2012-12-30 DIAGNOSIS — T83010A Breakdown (mechanical) of cystostomy catheter, initial encounter: Secondary | ICD-10-CM

## 2012-12-30 DIAGNOSIS — I10 Essential (primary) hypertension: Secondary | ICD-10-CM | POA: Insufficient documentation

## 2012-12-30 NOTE — ED Notes (Signed)
PA at bedside.

## 2012-12-30 NOTE — ED Provider Notes (Signed)
History     CSN: [id number]  Arrival date & time 12/30/12  1052   First MD Initiated Contact with Patient 12/30/12 1259      No chief complaint on file.   (Consider location/radiation/quality/duration/timing/severity/associated sxs/prior treatment) HPI Patient presents to the emergency department for evaluation of his suprapubic catheter.  Patient, states, that he feels, like it became dislodged and he replaced it is unsure if it is completely in place.  Patient, states he is having some pain at the ostomy.  Patient denies nausea, vomiting, or weakness, dizziness, or syncope.  Patient, states, that he did not take any medications prior to arrival No past medical history on file.  No past surgical history on file.  No family history on file.  History  Substance Use Topics  . Smoking status: Not on file  . Smokeless tobacco: Not on file  . Alcohol Use: Not on file      Review of Systems All other systems negative except as documented in the HPI. All pertinent positives and negatives as reviewed in the HPI. Allergies  Penicillins  Home Medications   Current Outpatient Rx  Name  Route  Sig  Dispense  Refill  . amLODipine (NORVASC) 10 MG tablet   Oral   Take 10 mg by mouth at bedtime.         . calcium acetate (PHOSLO) 667 MG capsule   Oral   Take 667 mg by mouth 3 (three) times daily with meals.         . cinacalcet (SENSIPAR) 30 MG tablet   Oral   Take 30 mg by mouth at bedtime.         . cloNIDine (CATAPRES) 0.3 MG tablet   Oral   Take 0.3 mg by mouth 2 (two) times daily.         Marland Kitchen doxazosin (CARDURA) 4 MG tablet   Oral   Take 4 mg by mouth at bedtime.         Marland Kitchen esomeprazole (NEXIUM) 40 MG capsule   Oral   Take 40 mg by mouth daily before breakfast.         . furosemide (LASIX) 40 MG tablet   Oral   Take 40 mg by mouth daily.         . metoprolol succinate (TOPROL-XL) 25 MG 24 hr tablet   Oral   Take 25 mg by mouth daily.         .  multivitamin (RENA-VIT) TABS tablet   Oral   Take 1 tablet by mouth daily.         . sevelamer carbonate (RENVELA) 800 MG tablet   Oral   Take 3,200 mg by mouth 3 (three) times daily with meals.          . valsartan (DIOVAN) 320 MG tablet   Oral   Take 320 mg by mouth at bedtime.           BP 147/105  Pulse 63  Temp(Src) 97.9 F (36.6 C) (Oral)  Resp 18  SpO2 98%  Physical Exam  Nursing note and vitals reviewed. Constitutional: He is oriented to person, place, and time. He appears well-developed and well-nourished. No distress.  HENT:  Head: Normocephalic and atraumatic.  Pulmonary/Chest: Effort normal.  Genitourinary:     Neurological: He is alert and oriented to person, place, and time.  Skin: Skin is warm and dry.    ED Course  Procedures (including critical care time) The nurse irrigated  the suprapubic catheter, and there is good sediment in urine return.  Patient is advised followup with his primary care Dr. told to return here as needed  [place], PA-C 12/30/12 1443

## 2012-12-30 NOTE — ED Notes (Signed)
RN at the bedside

## 2012-12-30 NOTE — ED Notes (Signed)
Pt c/o supra pubic cath out part of the way

## 2012-12-30 NOTE — ED Notes (Addendum)
PT. REPORTS THAT HIS SUPRAPUBIC CATHETER IS DISLODGED  THIS EVENING / BLADDER PRESSURE . SEEN HERE THIS MORNING FOR THE SAME COMPLAINT .

## 2012-12-31 ENCOUNTER — Emergency Department (HOSPITAL_COMMUNITY)
Admission: EM | Admit: 2012-12-31 | Discharge: 2012-12-31 | Disposition: A | Discharge status: Home / Self Care | Payer: Medicare Other | Attending: Emergency Medicine | Admitting: Emergency Medicine

## 2012-12-31 DIAGNOSIS — T83010A Breakdown (mechanical) of cystostomy catheter, initial encounter: Secondary | ICD-10-CM

## 2012-12-31 HISTORY — DX: Essential (primary) hypertension: I10

## 2012-12-31 NOTE — ED Notes (Signed)
Pt presents with suprapubic cath that he states came out earlier and he "shoved it back up as far as it would go."

## 2012-12-31 NOTE — ED Provider Notes (Signed)
History     CSN: [id number]  Arrival date & time 12/30/12  2218   First MD Initiated Contact with Patient 12/31/12 [phone]      Chief Complaint  Patient presents with  . Urinary Retention    (Consider location/radiation/quality/duration/timing/severity/associated sxs/prior treatment) The history is provided by the patient.   42 year old male has been indwelling suprapubic catheter. This morning, it came out and efficient back in. He was seen in the ED and the catheter was noted to be functioning appropriately. However, catheter has come out again. He denies abdominal pain, nausea, vomiting.   Past Medical History  Diagnosis Date  . Hypertension     History reviewed. No pertinent past surgical history.  No family history on file.  History  Substance Use Topics  . Smoking status: Never Smoker   . Smokeless tobacco: Not on file  . Alcohol Use: No      Review of Systems  All other systems reviewed and are negative.    Allergies  Penicillins  Home Medications   Current Outpatient Rx  Name  Route  Sig  Dispense  Refill  . amLODipine (NORVASC) 10 MG tablet   Oral   Take 10 mg by mouth at bedtime.         . calcium acetate (PHOSLO) 667 MG capsule   Oral   Take 667 mg by mouth 3 (three) times daily with meals.         . cinacalcet (SENSIPAR) 30 MG tablet   Oral   Take 30 mg by mouth at bedtime.         . cloNIDine (CATAPRES) 0.3 MG tablet   Oral   Take 0.3 mg by mouth 2 (two) times daily.         Marland Kitchen doxazosin (CARDURA) 4 MG tablet   Oral   Take 4 mg by mouth at bedtime.         Marland Kitchen esomeprazole (NEXIUM) 40 MG capsule   Oral   Take 40 mg by mouth daily before breakfast.         . furosemide (LASIX) 40 MG tablet   Oral   Take 40 mg by mouth daily.         . metoprolol succinate (TOPROL-XL) 25 MG 24 hr tablet   Oral   Take 25 mg by mouth daily.         . multivitamin (RENA-VIT) TABS tablet   Oral   Take 1 tablet by mouth daily.         . sevelamer carbonate (RENVELA) 800 MG tablet   Oral   Take 3,200 mg by mouth 3 (three) times daily with meals.          . valsartan (DIOVAN) 320 MG tablet   Oral   Take 320 mg by mouth at bedtime.           BP 128/86  Pulse 68  Temp(Src) 98.1 F (36.7 C) (Oral)  Resp 14  SpO2 98%  Physical Exam  Nursing note and vitals reviewed.  42 year old male, resting comfortably and in no acute distress. Vital signs are normal. Oxygen saturation is 98%, which is normal. Head is normocephalic and atraumatic. PERRLA, EOMI. Oropharynx is clear. Neck is nontender and supple without adenopathy or JVD. Back is nontender and there is no CVA tenderness. Lungs are clear without rales, wheezes, or rhonchi. Chest is nontender. Heart has regular rate and rhythm without murmur. Abdomen is soft, flat, nontender without masses or hepatosplenomegaly  and peristalsis is normoactive. Suprapubic stoma is present and does not appear infected. Catheter is not in place. Extremities have no cyanosis or edema, full range of motion is present. Skin is warm and dry without rash. Neurologic: Mental status is normal, cranial nerves are intact, there are no motor or sensory deficits.  ED Course  Procedures (including critical care time)  Suprapubic catheter insertion Indication: Dislodged catheter After a timeout was taken and the patient identified with 2 identifiers (hospital bracelet with medical record number, and verbally by name) 24 French Foley catheter was easily passed through his suprapubic stoma. There is no urine in the bladder but the catheter was flushed and had good return.  1. Suprapubic catheter dysfunction, initial encounter       MDM  Dislodged suprapubic catheter.  It or will be placed. Old records are reviewed and apparently in his catheter was draining appropriately when he was discharged on the morning of June 12.        Delora Fuel, MD [id number]

## 2012-12-31 NOTE — ED Provider Notes (Signed)
Medical screening examination/treatment/procedure(s) were conducted as a shared visit with non-physician practitioner(s) and myself.  I personally evaluated the patient during the encounter   .Face to face Exam:  General:  A&Ox3 HEENT:  Atraumatic Resp:  Normal effort Abd:  Nondistended      Dot Lanes, MD 12/31/12 1023

## 2012-12-31 NOTE — ED Notes (Signed)
Catheter placed at bedside

## 2012-12-31 NOTE — ED Notes (Signed)
Pt cath irrigated with 23ml sterile water and equal amount drained from cath.

## 2015-03-16 ENCOUNTER — Emergency Department (HOSPITAL_COMMUNITY)
Admission: EM | Admit: 2015-03-16 | Discharge: 2015-03-16 | Disposition: A | Discharge status: Home / Self Care | Payer: Medicare Other | Attending: Emergency Medicine | Admitting: Emergency Medicine

## 2015-03-16 ENCOUNTER — Encounter (HOSPITAL_COMMUNITY): Payer: Self-pay | Admitting: *Deleted

## 2015-03-16 ENCOUNTER — Emergency Department (HOSPITAL_COMMUNITY): Payer: Medicare Other

## 2015-03-16 DIAGNOSIS — Z880 Allergy status to penicillin: Secondary | ICD-10-CM | POA: Insufficient documentation

## 2015-03-16 DIAGNOSIS — I10 Essential (primary) hypertension: Secondary | ICD-10-CM | POA: Insufficient documentation

## 2015-03-16 DIAGNOSIS — Y9389 Activity, other specified: Secondary | ICD-10-CM | POA: Diagnosis not present

## 2015-03-16 DIAGNOSIS — S90812A Abrasion, left foot, initial encounter: Secondary | ICD-10-CM | POA: Diagnosis not present

## 2015-03-16 DIAGNOSIS — Z992 Dependence on renal dialysis: Secondary | ICD-10-CM | POA: Diagnosis not present

## 2015-03-16 DIAGNOSIS — Y9289 Other specified places as the place of occurrence of the external cause: Secondary | ICD-10-CM | POA: Diagnosis not present

## 2015-03-16 DIAGNOSIS — W2209XA Striking against other stationary object, initial encounter: Secondary | ICD-10-CM | POA: Insufficient documentation

## 2015-03-16 DIAGNOSIS — S99922A Unspecified injury of left foot, initial encounter: Secondary | ICD-10-CM

## 2015-03-16 DIAGNOSIS — Z79899 Other long term (current) drug therapy: Secondary | ICD-10-CM | POA: Diagnosis not present

## 2015-03-16 DIAGNOSIS — Y998 Other external cause status: Secondary | ICD-10-CM | POA: Insufficient documentation

## 2015-03-16 DIAGNOSIS — Z87448 Personal history of other diseases of urinary system: Secondary | ICD-10-CM | POA: Diagnosis not present

## 2015-03-16 DIAGNOSIS — S90415A Abrasion, left lesser toe(s), initial encounter: Secondary | ICD-10-CM

## 2015-03-16 DIAGNOSIS — M79675 Pain in left toe(s): Secondary | ICD-10-CM

## 2015-03-16 HISTORY — DX: Dependence on renal dialysis: Z99.2

## 2015-03-16 HISTORY — DX: Disorder of kidney and ureter, unspecified: N28.9

## 2015-03-16 MED ORDER — ACETAMINOPHEN 500 MG PO TABS
500.0000 mg | ORAL_TABLET | Freq: Once | ORAL | Status: AC
  Administered 2015-03-16: 500 mg via ORAL
  Filled 2015-03-16: qty 1

## 2015-03-16 NOTE — ED Notes (Signed)
Pt in stating he rolled out of his bed this morning and hit his left 4th toe, there is a abrasion at tip of toe, denies other injuries

## 2015-03-16 NOTE — ED Provider Notes (Signed)
CSN: [id number]     Arrival date & time 03/16/15  1440 History  This chart was scribed for non-physician practitioner Zacarias Pontes, PA-C working with Forde Dandy, MD by Zola Button, ED Scribe. This patient was seen in room TR10C/TR10C and the patient's care was started at 3:27 PM.       Chief Complaint  Patient presents with  . Toe Injury   Patient is a 44 y.o. male presenting with toe pain. The history is provided by the patient. No language interpreter was used.  Toe Pain This is a new problem. The current episode started 6 to 12 hours ago. The problem occurs constantly. The problem has not changed since onset.Pertinent negatives include no chest pain, no abdominal pain, no headaches and no shortness of breath. Nothing aggravates the symptoms. Nothing relieves the symptoms. He has tried nothing for the symptoms.   HPI Comments: Zavin Skubic is a 44 y.o. male with a history of hypertension and renal disorder on dialysis, who presents to the Emergency Department complaining of sudden onset left 4th toe pain that started around 9:30 AM after he hit his toe when he rolled out of bed this morning. Patient has an associated abrasion at the tip of the toe, where he says he thinks he had a blister already. LEVEL 5 CAVEAT: pt is a poor historian and very difficult to understand. He states the pain is 5/10 tightness/throbbing, constant, nonradiating, with no known aggravating factors, and with no tx tried prior to arrival aside from using peroxide and neosporin on the abrasion. Patient denies drainage, ongoing bleeding, erythema/warmth, swelling, fever, chills, chest pain, SOB, numbness, tingling, weakness, abdominal pain, nausea, vomiting, diarrhea, dysuria, hematuria, headache, and visual changes. He reports having some swelling in his bilateral feet at baseline, which is unchanged. Patient notes that he has not taken his blood pressure medications today. Denies hx of DM2 or immunosuppressing  meds/disorders.   Past Medical History  Diagnosis Date  . Hypertension   . Renal disorder   . Dialysis patient    History reviewed. No pertinent past surgical history. History reviewed. No pertinent family history. Social History  Substance Use Topics  . Smoking status: Never Smoker   . Smokeless tobacco: None  . Alcohol Use: No    Review of Systems  Constitutional: Negative for fever and chills.  Eyes: Negative for visual disturbance.  Respiratory: Negative for shortness of breath.   Cardiovascular: Positive for leg swelling (chronic and unchanged). Negative for chest pain.  Gastrointestinal: Negative for nausea, vomiting, abdominal pain, diarrhea and constipation.  Genitourinary: Negative for dysuria and hematuria.  Musculoskeletal: Positive for arthralgias (L 4th digit). Negative for myalgias and joint swelling.  Skin: Positive for wound. Negative for color change.  Allergic/Immunologic: Negative for immunocompromised state.  Neurological: Negative for weakness, numbness and headaches.   10 Systems reviewed and are negative for acute change except as noted in the HPI.    Allergies  Penicillins  Home Medications   Prior to Admission medications   Medication Sig Start Date End Date Taking? Authorizing Provider  amLODipine (NORVASC) 10 MG tablet Take 10 mg by mouth at bedtime.    Historical Provider, MD  calcium acetate (PHOSLO) 667 MG capsule Take 667 mg by mouth 3 (three) times daily with meals.    Historical Provider, MD  cinacalcet (SENSIPAR) 30 MG tablet Take 30 mg by mouth at bedtime.    Historical Provider, MD  cloNIDine (CATAPRES) 0.3 MG tablet Take 0.3 mg by mouth 2 (  two) times daily.    Historical Provider, MD  doxazosin (CARDURA) 4 MG tablet Take 4 mg by mouth at bedtime.    Historical Provider, MD  esomeprazole (NEXIUM) 40 MG capsule Take 40 mg by mouth daily before breakfast.    Historical Provider, MD  furosemide (LASIX) 40 MG tablet Take 40 mg by mouth  daily.    Historical Provider, MD  metoprolol succinate (TOPROL-XL) 25 MG 24 hr tablet Take 25 mg by mouth daily.    Historical Provider, MD  multivitamin (RENA-VIT) TABS tablet Take 1 tablet by mouth daily.    Historical Provider, MD  sevelamer carbonate (RENVELA) 800 MG tablet Take 3,200 mg by mouth 3 (three) times daily with meals.     Historical Provider, MD  valsartan (DIOVAN) 320 MG tablet Take 320 mg by mouth at bedtime.    Historical Provider, MD   BP 165/130 mmHg  Pulse 74  Temp(Src) 98.4 F (36.9 C) (Oral)  Resp 18  Ht 6\' 2"  (1.88 m)  Wt 192 lb (87.091 kg)  BMI 24.64 kg/m2  SpO2 100% Physical Exam  Constitutional: He is oriented to person, place, and time. Vital signs are normal. He appears well-developed and well-nourished.  Non-toxic appearance. No distress.  Afebrile, nontoxic, NAD. Hypertension noted.  HENT:  Head: Normocephalic and atraumatic.  Mouth/Throat: Mucous membranes are normal.  Eyes: Conjunctivae and EOM are normal. Right eye exhibits no discharge. Left eye exhibits no discharge.  Neck: Normal range of motion. Neck supple.  Cardiovascular: Normal rate and intact distal pulses.   Pulmonary/Chest: Effort normal. No respiratory distress.  Abdominal: Normal appearance. He exhibits no distension.  Musculoskeletal: Normal range of motion.       Left foot: There is tenderness and laceration (abrasion). There is normal range of motion, no swelling, normal capillary refill and no deformity.  Left 4th toe with small abrasion to the distal tip with no ongoing bleeding, mild TTP to distal phalanx, with FROM intact in all digits, no swelling or deformity to affected toe, distal pulses intact with cap refill brisk and present, strength and sensation grossly intact. Chronic stable bilateral 1+ pedal edema which patient states is unchanged. No erythema or warmth, no tenderness to the forefoot. Yellow hypertrophied toenails on all digits.  Neurological: He is alert and oriented  to person, place, and time. He has normal strength. No sensory deficit.  Skin: Skin is warm and dry. No rash noted.  Psychiatric: He has a normal mood and affect.  Nursing note and vitals reviewed.   ED Course  Procedures  DIAGNOSTIC STUDIES: Oxygen Saturation is 100% on room air, normal by my interpretation.    COORDINATION OF CARE: 3:32 PM-Discussed treatment plan which includes Tylenol and XR imaging with patient/guardian at bedside and patient/guardian agreed to plan.    Labs Review Labs Reviewed - No data to display  Imaging Review No results found. I have personally reviewed and evaluated these images and lab results as part of my medical decision-making.   EKG Interpretation None      MDM   Final diagnoses:  Toe pain, left  Toe injury, left, initial encounter  Abrasion of toe of left foot, initial encounter  HTN (hypertension), benign    44 y.o. male here with L 4th toe pain after hitting it when he got out of bed. NVI with soft compartments. Small abrasio to distal tip, mildly TTP. Will obtain xray to eval for fx. No ongoing bleeding, no intervention needed for his abrasion. Of note  pt is noted as having BP of 165/130, he is asymptomatic. He states he hasn't taken any of his home meds this morning because he didn't eat. Pt poor historian and difficult to understand but it seems that he is ESRD on dialysis and has chronic LE swelling which is noted today. Does not seem to be an acute issue, nor does it seem to be related to his visit today. Will give tylenol now and reassess shortly after xray.     3:58 PM Care signed out to [employer] PA-C at shift change. Please see his note for further documentation of care/dispo. If xray neg, pt can be d/c'd with tylenol/motrin for pain, buddy taping for comfort, and f/up with PCP in 3 days.   I personally performed the services described in this documentation, which was scribed in my presence. The recorded information has been  reviewed and is accurate.  BP 165/130 mmHg  Pulse 74  Temp(Src) 98.4 F (36.9 C) (Oral)  Resp 18  Ht 6\' 2"  (1.88 m)  Wt 192 lb (87.091 kg)  BMI 24.64 kg/m2  SpO2 100%  Meds ordered this encounter  Medications  . acetaminophen (TYLENOL) tablet 500 mg    Sig:      Zacarias Pontes, PA-C 03/16/15 1559  Forde Dandy, MD 03/17/15 [phone]

## 2015-03-16 NOTE — Discharge Instructions (Signed)
Keep wound clean with mild soap and water. Keep area covered with a topical antibiotic ointment and bandage, keep bandage dry. Ice and elevate for additional pain relief and swelling. Alternate between motrin and Tylenol for additional pain relief. Follow up with your primary care doctor or the [facility] in approximately 3 days for wound recheck. Monitor area for signs of infection to include, but not limited to: increasing pain, redness, drainage/pus, or swelling. Return to emergency department for emergent changing or worsening symptoms.   Take your blood pressure medications regularly, and monitor your blood pressure daily. Eat a low salt diet. Follow up with your regular doctor in 3 days for recheck and ongoing management of your blood pressure.    Abrasions An abrasion is a cut or scrape of the skin. Abrasions do not go through all layers of the skin. HOME CARE  If a bandage (dressing) was put on your wound, change it as told by your doctor. If the bandage sticks, soak it off with warm.  Wash the area with water and soap 2 times a day. Rinse off the soap. Pat the area dry with a clean towel.  Put on medicated cream (ointment) as told by your doctor.  Change your bandage right away if it gets wet or dirty.  Only take medicine as told by your doctor.  See your doctor within 24-48 hours to get your wound checked.  Check your wound for redness, puffiness (swelling), or yellowish-white fluid (pus). GET HELP RIGHT AWAY IF:   You have more pain in the wound.  You have redness, swelling, or tenderness around the wound.  You have pus coming from the wound.  You have a fever or lasting symptoms for more than 2-3 days.  You have a fever and your symptoms suddenly get worse.  You have a bad smell coming from the wound or bandage. MAKE SURE YOU:   Understand these instructions.  Will watch your condition.  Will get help right away if you are not doing well or get  worse. Document Released: 12/24/2007 Document Revised: 03/31/2012 Document Reviewed: 06/10/2011 [facility] Patient Information 2015 [place], [place]. This information is not intended to replace advice given to you by your health care provider. Make sure you discuss any questions you have with your health care provider.  Wound Care Wound care helps prevent pain and infection.  You may need a tetanus shot if:  You cannot remember when you had your last tetanus shot.  You have never had a tetanus shot.  The injury broke your skin. If you need a tetanus shot and you choose not to have one, you may get tetanus. Sickness from tetanus can be serious. HOME CARE   Only take medicine as told by your doctor.  Clean the wound daily with mild soap and water.  Change any bandages (dressings) as told by your doctor.  Put medicated cream and a bandage on the wound as told by your doctor.  Change the bandage if it gets wet, dirty, or starts to smell.  Take showers. Do not take baths, swim, or do anything that puts your wound under water.  Rest and raise (elevate) the wound until the pain and puffiness (swelling) are better.  Keep all doctor visits as told. GET HELP RIGHT AWAY IF:   Yellowish-white fluid (pus) comes from the wound.  Medicine does not lessen your pain.  There is a red streak going away from the wound.  You have a fever.  MAKE SURE YOU:   Understand these instructions.  Will watch your condition.  Will get help right away if you are not doing well or get worse. Document Released: 04/15/2008 Document Revised: 09/29/2011 Document Reviewed: 11/10/2010 [facility] Patient Information 2015 [place], [place]. This information is not intended to replace advice given to you by your health care provider. Make sure you discuss any questions you have with your health care provider.  Buddy Taping of Toes We have taped your toes together to keep them from moving. This is called "buddy  taping" since we used a part of your own body to keep the injured part still. We placed soft padding between your toes to keep them from rubbing against each other. Buddy taping will help with healing and to reduce pain. Keep your toes buddy taped together for as long as directed by your caregiver. HOME CARE INSTRUCTIONS   Raise your injured area above the level of your heart while sitting or lying down. Prop it up with pillows.  An ice pack used every twenty minutes, while awake, for the first one to two days may be helpful. Put ice in a plastic bag and put a towel between the bag and your skin.  Watch for signs that the taping is too tight. These signs may be:  Numbness of your taped toes.  Coolness of your taped toes.  Color change in the area beyond the tape.  Increased pain.  If you have any of these signs, loosen or rewrap the tape. If you need to loosen or rewrap the buddy tape, make sure you use the padding again. SEEK IMMEDIATE MEDICAL CARE IF:   You have worse pain, swelling, inflammation (soreness), drainage or bleeding after you rewrap the tape.  Any new problems occur. MAKE SURE YOU:   Understand these instructions.  Will watch your condition.  Will get help right away if you are not doing well or get worse. Document Released: 04/10/2004 Document Revised: 09/29/2011 Document Reviewed: 07/04/2008 [facility] Patient Information 2015 [place], [place]. This information is not intended to replace advice given to you by your health care provider. Make sure you discuss any questions you have with your health care provider.  Cryotherapy Cryotherapy is when you put ice on your injury. Ice helps lessen pain and puffiness (swelling) after an injury. Ice works the best when you start using it in the first 24 to 48 hours after an injury. HOME CARE  Put a dry or damp towel between the ice pack and your skin.  You may press gently on the ice pack.  Leave the ice on for no more  than 10 to 20 minutes at a time.  Check your skin after 5 minutes to make sure your skin is okay.  Rest at least 20 minutes between ice pack uses.  Stop using ice when your skin loses feeling (numbness).  Do not use ice on someone who cannot tell you when it hurts. This includes small children and people with memory problems (dementia). GET HELP RIGHT AWAY IF:  You have white spots on your skin.  Your skin turns blue or pale.  Your skin feels waxy or hard.  Your puffiness gets worse. MAKE SURE YOU:   Understand these instructions.  Will watch your condition.  Will get help right away if you are not doing well or get worse. Document Released: 12/24/2007 Document Revised: 09/29/2011 Document Reviewed: 02/27/2011 [facility] Patient Information 2015 [place], [place]. This information is not intended to replace advice given  to you by your health care provider. Make sure you discuss any questions you have with your health care provider.  Hypertension Hypertension is another name for high blood pressure. High blood pressure forces your heart to work harder to pump blood. A blood pressure reading has two numbers, which includes a higher number over a lower number (example: 110/72). HOME CARE   Have your blood pressure rechecked by your doctor.  Only take medicine as told by your doctor. Follow the directions carefully. The medicine does not work as well if you skip doses. Skipping doses also puts you at risk for problems.  Do not smoke.  Monitor your blood pressure at home as told by your doctor. GET HELP IF:  You think you are having a reaction to the medicine you are taking.  You have repeat headaches or feel dizzy.  You have puffiness (swelling) in your ankles.  You have trouble with your vision. GET HELP RIGHT AWAY IF:   You get a very bad headache and are confused.  You feel weak, numb, or faint.  You get chest or belly (abdominal) pain.  You throw up (vomit).  You  cannot breathe very well. MAKE SURE YOU:   Understand these instructions.  Will watch your condition.  Will get help right away if you are not doing well or get worse. Document Released: 12/24/2007 Document Revised: 07/12/2013 Document Reviewed: 04/29/2013 [facility] Patient Information 2015 [place], [place]. This information is not intended to replace advice given to you by your health care provider. Make sure you discuss any questions you have with your health care provider.  Managing Your High Blood Pressure Blood pressure is a measurement of how forceful your blood is pressing against the walls of the arteries. Arteries are muscular tubes within the circulatory system. Blood pressure does not stay the same. Blood pressure rises when you are active, excited, or nervous; and it lowers during sleep and relaxation. If the numbers measuring your blood pressure stay above normal most of the time, you are at risk for health problems. High blood pressure (hypertension) is a long-term (chronic) condition in which blood pressure is elevated. A blood pressure reading is recorded as two numbers, such as 120 over 80 (or 120/80). The first, higher number is called the systolic pressure. It is a measure of the pressure in your arteries as the heart beats. The second, lower number is called the diastolic pressure. It is a measure of the pressure in your arteries as the heart relaxes between beats.  Keeping your blood pressure in a normal range is important to your overall health and prevention of health problems, such as heart disease and stroke. When your blood pressure is uncontrolled, your heart has to work harder than normal. High blood pressure is a very common condition in adults because blood pressure tends to rise with age. Men and women are equally likely to have hypertension but at different times in life. Before age 7, men are more likely to have hypertension. After 46 years of age, women are more likely  to have it. Hypertension is especially common in African Americans. This condition often has no signs or symptoms. The cause of the condition is usually not known. Your caregiver can help you come up with a plan to keep your blood pressure in a normal, healthy range. BLOOD PRESSURE STAGES Blood pressure is classified into four stages: normal, prehypertension, stage 1, and stage 2. Your blood pressure reading will be used to determine what type  of treatment, if any, is necessary. Appropriate treatment options are tied to these four stages:  Normal  Systolic pressure (mm Hg): below 120.  Diastolic pressure (mm Hg): below 80. Prehypertension  Systolic pressure (mm Hg): 120 to 139.  Diastolic pressure (mm Hg): 80 to 89. Stage1  Systolic pressure (mm Hg): 140 to 159.  Diastolic pressure (mm Hg): 90 to 99. Stage2  Systolic pressure (mm Hg): 160 or above.  Diastolic pressure (mm Hg): 100 or above. RISKS RELATED TO HIGH BLOOD PRESSURE Managing your blood pressure is an important responsibility. Uncontrolled high blood pressure can lead to:  A heart attack.  A stroke.  A weakened blood vessel (aneurysm).  Heart failure.  Kidney damage.  Eye damage.  Metabolic syndrome.  Memory and concentration problems. HOW TO MANAGE YOUR BLOOD PRESSURE Blood pressure can be managed effectively with lifestyle changes and medicines (if needed). Your caregiver will help you come up with a plan to bring your blood pressure within a normal range. Your plan should include the following: Education  Read all information provided by your caregivers about how to control blood pressure.  Educate yourself on the latest guidelines and treatment recommendations. New research is always being done to further define the risks and treatments for high blood pressure. Lifestylechanges  Control your weight.  Avoid smoking.  Stay physically active.  Reduce the amount of salt in your diet.  Reduce  stress.  Control any chronic conditions, such as high cholesterol or diabetes.  Reduce your alcohol intake. Medicines  Several medicines (antihypertensive medicines) are available, if needed, to bring blood pressure within a normal range. Communication  Review all the medicines you take with your caregiver because there may be side effects or interactions.  Talk with your caregiver about your diet, exercise habits, and other lifestyle factors that may be contributing to high blood pressure.  See your caregiver regularly. Your caregiver can help you create and adjust your plan for managing high blood pressure. RECOMMENDATIONS FOR TREATMENT AND FOLLOW-UP  The following recommendations are based on current guidelines for managing high blood pressure in nonpregnant adults. Use these recommendations to identify the proper follow-up period or treatment option based on your blood pressure reading. You can discuss these options with your caregiver.  Systolic pressure of 123456 to [id number] or diastolic pressure of 80 to 89: Follow up with your caregiver as directed.  Systolic pressure of [id number] to 0000000 or diastolic pressure of 90 to 100: Follow up with your caregiver within 2 months.  Systolic pressure above 0000000 or diastolic pressure above [id number]: Follow up with your caregiver within 1 month.  Systolic pressure above [id number] or diastolic pressure above [id number]: Consider antihypertensive therapy; follow up with your caregiver within 1 week.  Systolic pressure above [id number] or diastolic pressure above 123456: Begin antihypertensive therapy; follow up with your caregiver within 1 week. Document Released: 03/31/2012 Document Reviewed: 03/31/2012 [facility] Patient Information 2015 [place]. This information is not intended to replace advice given to you by your health care provider. Make sure you discuss any questions you have with your health care provider.  How to Take Your Blood Pressure HOW DO I GET A BLOOD PRESSURE  MACHINE?  You can buy an electronic home blood pressure machine at your local pharmacy. Insurance will sometimes cover the cost if you have a prescription.  Ask your doctor what type of machine is best for you. There are different machines for your arm and your wrist.  If you decide to buy  a machine to check your blood pressure on your arm, first check the size of your arm so you can buy the right size cuff. To check the size of your arm:   Use a measuring tape that shows both inches and centimeters.   Wrap the measuring tape around the upper-middle part of your arm. You may need someone to help you measure.   Write down your arm measurement in both inches and centimeters.   To measure your blood pressure correctly, it is important to have the right size cuff.   If your arm is up to 13 inches (up to 34 centimeters), get an adult cuff size.  If your arm is 13 to 17 inches (35 to 44 centimeters), get a large adult cuff size.    If your arm is 17 to 20 inches (45 to 52 centimeters), get an adult thigh cuff.  WHAT DO THE NUMBERS MEAN?   There are two numbers that make up your blood pressure. For example: 120/80.  The first number (120 in our example) is called the "systolic pressure." It is a measure of the pressure in your blood vessels when your heart is pumping blood.  The second number (80 in our example) is called the "diastolic pressure." It is a measure of the pressure in your blood vessels when your heart is resting between beats.  Your doctor will tell you what your blood pressure should be. WHAT SHOULD I DO BEFORE I CHECK MY BLOOD PRESSURE?   Try to rest or relax for at least 30 minutes before you check your blood pressure.  Do not smoke.  Do not have any drinks with caffeine, such as:  Soda.  Coffee.  Tea.  Check your blood pressure in a quiet room.  Sit down and stretch out your arm on a table. Keep your arm at about the level of your heart. Let your arm  relax.  Make sure that your legs are not crossed. HOW DO I CHECK MY BLOOD PRESSURE?  Follow the directions that came with your machine.  Make sure you remove any tight-fighting clothing from your arm or wrist. Wrap the cuff around your upper arm or wrist. You should be able to fit a finger between the cuff and your arm. If you cannot fit a finger between the cuff and your arm, it is too tight and should be removed and rewrapped.  Some units require you to manually pump up the arm cuff.  Automatic units inflate the cuff when you press a button.  Cuff deflation is automatic in both models.  After the cuff is inflated, the unit measures your blood pressure and pulse. The readings are shown on a monitor. Hold still and breathe normally while the cuff is inflated.  Getting a reading takes less than a minute.  Some models store readings in a memory. Some provide a printout of readings. If your machine does not store your readings, keep a written record.  Take readings with you to your next visit with your doctor. Document Released: 06/19/2008 Document Revised: 11/21/2013 Document Reviewed: 09/01/2013 [facility] Patient Information 2015 [place], [place]. This information is not intended to replace advice given to you by your health care provider. Make sure you discuss any questions you have with your health care provider.  DASH Eating Plan DASH stands for "Dietary Approaches to Stop Hypertension." The DASH eating plan is a healthy eating plan that has been shown to reduce high blood pressure (hypertension). Additional health benefits may  include reducing the risk of type 2 diabetes mellitus, heart disease, and stroke. The DASH eating plan may also help with weight loss. WHAT DO I NEED TO KNOW ABOUT THE DASH EATING PLAN? For the DASH eating plan, you will follow these general guidelines:  Choose foods with a percent daily value for sodium of less than 5% (as listed on the food label).  Use  salt-free seasonings or herbs instead of table salt or sea salt.  Check with your health care provider or pharmacist before using salt substitutes.  Eat lower-sodium products, often labeled as "lower sodium" or "no salt added."  Eat fresh foods.  Eat more vegetables, fruits, and low-fat dairy products.  Choose whole grains. Look for the word "whole" as the first word in the ingredient list.  Choose fish and skinless chicken or [place] more often than red meat. Limit fish, poultry, and meat to 6 oz (170 g) each day.  Limit sweets, desserts, sugars, and sugary drinks.  Choose heart-healthy fats.  Limit cheese to 1 oz (28 g) per day.  Eat more home-cooked food and less restaurant, buffet, and fast food.  Limit fried foods.  [place]eger foods using methods other than frying.  Limit canned vegetables. If you do use them, rinse them well to decrease the sodium.  When eating at a restaurant, ask that your food be prepared with less salt, or no salt if possible. WHAT FOODS CAN I EAT? Seek help from a dietitian for individual calorie needs. Grains Whole grain or whole wheat bread. Brown rice. Whole grain or whole wheat pasta. Quinoa, bulgur, and whole grain cereals. Low-sodium cereals. Corn or whole wheat flour tortillas. Whole grain cornbread. Whole grain crackers. Low-sodium crackers. Vegetables Fresh or frozen vegetables (raw, steamed, roasted, or grilled). Low-sodium or reduced-sodium tomato and vegetable juices. Low-sodium or reduced-sodium tomato sauce and paste. Low-sodium or reduced-sodium canned vegetables.  Fruits All fresh, canned (in natural juice), or frozen fruits. Meat and Other Protein Products Ground beef (85% or leaner), grass-fed beef, or beef trimmed of fat. Skinless chicken or [place]. Ground chicken or [place]. Pork trimmed of fat. All fish and seafood. Eggs. Dried beans, peas, or lentils. Unsalted nuts and seeds. Unsalted canned beans. Dairy Low-fat dairy products, such as  skim or 1% milk, 2% or reduced-fat cheeses, low-fat ricotta or cottage cheese, or plain low-fat yogurt. Low-sodium or reduced-sodium cheeses. Fats and Oils Tub margarines without trans fats. Light or reduced-fat mayonnaise and salad dressings (reduced sodium). Avocado. Safflower, olive, or canola oils. Natural peanut or almond butter. Other Unsalted popcorn and pretzels. The items listed above may not be a complete list of recommended foods or beverages. Contact your dietitian for more options. WHAT FOODS ARE NOT RECOMMENDED? Grains White bread. White pasta. White rice. Refined cornbread. Bagels and croissants. Crackers that contain trans fat. Vegetables Creamed or fried vegetables. Vegetables in a cheese sauce. Regular canned vegetables. Regular canned tomato sauce and paste. Regular tomato and vegetable juices. Fruits Dried fruits. Canned fruit in light or heavy syrup. Fruit juice. Meat and Other Protein Products Fatty cuts of meat. Ribs, chicken wings, bacon, sausage, bologna, salami, chitterlings, fatback, hot dogs, bratwurst, and packaged luncheon meats. Salted nuts and seeds. Canned beans with salt. Dairy Whole or 2% milk, cream, half-and-half, and cream cheese. Whole-fat or sweetened yogurt. Full-fat cheeses or blue cheese. Nondairy creamers and whipped toppings. Processed cheese, cheese spreads, or cheese curds. Condiments Onion and garlic salt, seasoned salt, table salt, and sea salt. Canned and packaged gravies. Worcestershire  sauce. Tartar sauce. Barbecue sauce. Teriyaki sauce. Soy sauce, including reduced sodium. Steak sauce. Fish sauce. Oyster sauce. Cocktail sauce. Horseradish. Ketchup and mustard. Meat flavorings and tenderizers. Bouillon cubes. Hot sauce. Tabasco sauce. Marinades. Taco seasonings. Relishes. Fats and Oils Butter, stick margarine, lard, shortening, ghee, and bacon fat. Coconut, palm kernel, or palm oils. Regular salad dressings. Other Pickles and olives. Salted  popcorn and pretzels. The items listed above may not be a complete list of foods and beverages to avoid. Contact your dietitian for more information. WHERE CAN I FIND MORE INFORMATION? National Heart, [employer], and [employer] Institute: travelstabloid.com Document Released: 06/26/2011 Document Revised: 11/21/2013 Document Reviewed: 05/11/2013 [facility] Patient Information 2015 [place], [place]. This information is not intended to replace advice given to you by your health care provider. Make sure you discuss any questions you have with your health care provider.

## 2015-03-16 NOTE — ED Provider Notes (Signed)
Patient signed out to me by Camprubi-Soms, PA-C.  Patient stubbed toe this morning.  Plan:  F/u on x-ray.  DC if negative.  Patient with ESRD.  HD on TRSa, went yesterday and planning to go tomorrow.  BP is elevated, but patient didn't take morning BP meds because he didn't eat breakfast.  Tangential thoughts, but easily redirectable.  No sign of cellulitis or abscess.    Montine Circle, PA-C 03/16/15 [place] Liu, MD 03/17/15 [id number]

## 2015-05-07 ENCOUNTER — Encounter (HOSPITAL_COMMUNITY): Payer: Self-pay | Admitting: *Deleted

## 2015-05-07 ENCOUNTER — Emergency Department (HOSPITAL_COMMUNITY)
Admission: EM | Admit: 2015-05-07 | Discharge: 2015-05-07 | Disposition: A | Discharge status: Home / Self Care | Payer: Medicare Other | Attending: Emergency Medicine | Admitting: Emergency Medicine

## 2015-05-07 DIAGNOSIS — Z992 Dependence on renal dialysis: Secondary | ICD-10-CM | POA: Diagnosis not present

## 2015-05-07 DIAGNOSIS — Y998 Other external cause status: Secondary | ICD-10-CM | POA: Insufficient documentation

## 2015-05-07 DIAGNOSIS — Y9289 Other specified places as the place of occurrence of the external cause: Secondary | ICD-10-CM | POA: Insufficient documentation

## 2015-05-07 DIAGNOSIS — Y9389 Activity, other specified: Secondary | ICD-10-CM | POA: Diagnosis not present

## 2015-05-07 DIAGNOSIS — S90425A Blister (nonthermal), left lesser toe(s), initial encounter: Secondary | ICD-10-CM | POA: Diagnosis not present

## 2015-05-07 DIAGNOSIS — X58XXXA Exposure to other specified factors, initial encounter: Secondary | ICD-10-CM | POA: Insufficient documentation

## 2015-05-07 DIAGNOSIS — Z79899 Other long term (current) drug therapy: Secondary | ICD-10-CM | POA: Diagnosis not present

## 2015-05-07 DIAGNOSIS — I10 Essential (primary) hypertension: Secondary | ICD-10-CM | POA: Diagnosis not present

## 2015-05-07 DIAGNOSIS — Z87448 Personal history of other diseases of urinary system: Secondary | ICD-10-CM | POA: Insufficient documentation

## 2015-05-07 MED ORDER — MUPIROCIN CALCIUM 2 % EX CREA
TOPICAL_CREAM | Freq: Two times a day (BID) | CUTANEOUS | Status: DC
  Administered 2015-05-07: 23:00:00 via TOPICAL
  Filled 2015-05-07: qty 15

## 2015-05-07 NOTE — Discharge Instructions (Signed)
Blisters A blister is a fluid-filled sac that forms between layers of skin. Blisters often form in areas where skin rubs against other skin or rubs against something else. The most common areas for blisters are the hands and feet. CAUSES A blister can be caused by:  An injury.  A burn.  An allergic reaction.  An infection.  Exposure to irritating chemicals.  Friction. Friction blisters often result from:  Sports.  Repetitive activities.  Shoes that are too tight or too loose. SIGNS AND SYMPTOMS A blister is often round and looks like a bump. It may itch or be painful to the touch. The liquid in a blister is clear or bloody. Before a blister forms, the skin may become red, feel warm, itch, or be painful to the touch. DIAGNOSIS A blister can usually be diagnosed from its appearance. TREATMENT Treatment involves protecting the area where the blister has formed until the skin has healed. If something is likely to rub against the blister, apply a bandage (dressing) with a hole in the middle over the blister. Most blisters break open, dry up, and go away on their own within 10 days. Rarely, blisters that are very painful may be drained before they break open on their own. Draining of a blister should only be done by a health care provider under sterile conditions. HOME CARE INSTRUCTIONS  Protect the area where the blister has formed as directed by your health care provider.  Do not open or pop your blister, because it could become infected.  If the blister is very painful, ask your health care provider whether you should have it drained.  If the blister breaks open on its own:  Do not remove the loose skin that is over the blister.  Wash the blister area with soap and water every day.  After washing the blister area, you may apply an antibiotic cream or ointment and cover the area with a bandage. PREVENTION Taking these steps can help to prevent blisters that are caused by  friction:  Wear comfortable shoes that fit well.  Always wear socks with shoes.  Wear extra socks or use tape, bandages, or pads over blister-prone areas as needed.  Wear protective gear, such as gloves, when participating in sports or activities that can cause blisters.  Use powders as needed to keep your feet dry. SEEK MEDICAL CARE IF:  You have increased redness, swelling, or pain in the blister area.  A puslike discharge is coming from the blister area.  You have a fever.  You have chills.   This information is not intended to replace advice given to you by your health care provider. Make sure you discuss any questions you have with your health care provider.   Document Released: 08/14/2004 Document Revised: 07/28/2014 Document Reviewed: 02/04/2014 Elsevier Interactive Patient Education [employer].

## 2015-05-07 NOTE — ED Provider Notes (Signed)
CSN: [id number]     Arrival date & time 05/07/15  1547 History   First MD Initiated Contact with Patient 05/07/15 1920     Chief Complaint  Patient presents with  . Toe Pain     (Consider location/radiation/quality/duration/timing/severity/associated sxs/prior Treatment) HPI Comments: Patient presents with evaluation of a blister on his toe. He apparently had an injury in August where a small piece of skin was taken off his left fourth toe. He states since that time it's been intermittently swelling. He denies any pain. There is no foot pain. No fevers. He is a dialysis patient and states that he had dialysis 2 days ago. He denies missing any dialysis. He denies any shortness of breath. His blood pressure is noted to be elevated but he states he's taking his medications. He denies any headache or chest pain.  Patient is a 44 y.o. male presenting with toe pain.  Toe Pain Pertinent negatives include no headaches.    Past Medical History  Diagnosis Date  . Hypertension   . Renal disorder   . Dialysis patient [facility])    History reviewed. No pertinent past surgical history. History reviewed. No pertinent family history. Social History  Substance Use Topics  . Smoking status: Never Smoker   . Smokeless tobacco: None  . Alcohol Use: No    Review of Systems  Constitutional: Negative for fever.  Gastrointestinal: Negative for nausea and vomiting.  Musculoskeletal: Negative for back pain, joint swelling, arthralgias and neck pain.  Skin: Positive for wound.  Neurological: Negative for weakness, numbness and headaches.      Allergies  Naproxen; Lactose intolerance (gi); and Penicillins  Home Medications   Prior to Admission medications   Medication Sig Start Date End Date Taking? Authorizing Provider  acetaminophen (TYLENOL) 500 MG tablet Take 500 mg by mouth every 6 (six) hours as needed for moderate pain.   Yes Historical Provider, MD  amLODipine (NORVASC) 10 MG tablet Take 10  mg by mouth at bedtime.   Yes Historical Provider, MD  calcium acetate (PHOSLO) 667 MG capsule Take 667 mg by mouth 3 (three) times daily with meals.   Yes Historical Provider, MD  cinacalcet (SENSIPAR) 30 MG tablet Take 30 mg by mouth every Monday, Wednesday, and Friday.    Yes Historical Provider, MD  cloNIDine (CATAPRES) 0.3 MG tablet Take 0.3 mg by mouth 2 (two) times daily.   Yes Historical Provider, MD  doxazosin (CARDURA) 4 MG tablet Take 4 mg by mouth at bedtime.   Yes Historical Provider, MD  esomeprazole (NEXIUM) 40 MG capsule Take 40 mg by mouth daily before breakfast.   Yes Historical Provider, MD  furosemide (LASIX) 40 MG tablet Take 40 mg by mouth daily.   Yes Historical Provider, MD  metoprolol succinate (TOPROL-XL) 25 MG 24 hr tablet Take 25 mg by mouth at bedtime.    Yes Historical Provider, MD  multivitamin (RENA-VIT) TABS tablet Take 1 tablet by mouth daily.   Yes Historical Provider, MD  sevelamer carbonate (RENVELA) 800 MG tablet Take 800 mg by mouth 4 (four) times daily.    Yes Historical Provider, MD  valsartan (DIOVAN) 320 MG tablet Take 320 mg by mouth at bedtime.   Yes Historical Provider, MD   BP 179/120 mmHg  Pulse 51  Temp(Src) 98.3 F (36.8 C) (Oral)  Resp 20  SpO2 100% Physical Exam  Constitutional: He is oriented to person, place, and time. He appears well-developed and well-nourished.  HENT:  Head: Normocephalic and atraumatic.  Neck: Normal range of motion. Neck supple.  Cardiovascular: Normal rate.   Pulmonary/Chest: Effort normal.  Musculoskeletal: He exhibits no edema or tenderness.  Patient has a small area of essentially healed skin from what appears to be a prior blister on his left fourth toe. There is minimal amount of surrounding erythema. There is no erythema to the foot. There is no tenderness on palpation of the toe. There initially with some mild swelling of both feet however when he sat down and elevated his feet on the bed, the swelling  almost completely resolved. There is no warmth or erythema to the foot.  Neurological: He is alert and oriented to person, place, and time.  Skin: Skin is warm and dry.  Psychiatric: He has a normal mood and affect.      ED Course  Procedures (including critical care time) Labs Review Labs Reviewed - No data to display  Imaging Review No results found. I have personally reviewed and evaluated these images and lab results as part of my medical decision-making.   EKG Interpretation None      MDM   Final diagnoses:  Blister of toe of left foot, initial encounter    Patient has what appears to be healing blister. There is minimal surrounding erythema. There is no drainage. No other signs of infection. I will give him some Bactroban ointment to use to the area. He has been changing the dressings daily and appears to be taking good care of it. I advised him to continue the current wound management. I advised him to continue his regular dialysis. Return here if he has any worsening symptoms.    Malvin Johns, MD 05/07/15 [phone]

## 2015-05-07 NOTE — ED Notes (Addendum)
Patient reports that he came to the ED to "keep my toes from exploding." Denies pain. States "they're not hurting yet." Patient continues to mumble to self intermittently.

## 2015-05-07 NOTE — ED Notes (Signed)
Pt reports having blisters and sores to left 2nd, 3rd, 4th toes. Ambulatory at triage. No fever or redness noted to toes.

## 2016-03-07 DIAGNOSIS — R600 Localized edema: Secondary | ICD-10-CM | POA: Insufficient documentation

## 2016-03-07 DIAGNOSIS — B351 Tinea unguium: Secondary | ICD-10-CM | POA: Insufficient documentation

## 2016-03-07 DIAGNOSIS — E1142 Type 2 diabetes mellitus with diabetic polyneuropathy: Secondary | ICD-10-CM | POA: Insufficient documentation

## 2016-10-30 DIAGNOSIS — R195 Other fecal abnormalities: Secondary | ICD-10-CM | POA: Insufficient documentation

## 2016-10-30 DIAGNOSIS — I10 Essential (primary) hypertension: Secondary | ICD-10-CM | POA: Insufficient documentation

## 2016-11-03 LAB — HM COLONOSCOPY

## 2017-03-28 ENCOUNTER — Encounter (HOSPITAL_COMMUNITY): Payer: Self-pay

## 2017-03-28 ENCOUNTER — Inpatient Hospital Stay (HOSPITAL_COMMUNITY)
Admission: EM | Admit: 2017-03-28 | Discharge: 2017-04-08 | DRG: 628 | Disposition: A | Discharge status: Home / Self Care | Payer: Medicare Other | Attending: Internal Medicine | Admitting: Internal Medicine

## 2017-03-28 ENCOUNTER — Emergency Department (HOSPITAL_COMMUNITY): Payer: Medicare Other

## 2017-03-28 DIAGNOSIS — K219 Gastro-esophageal reflux disease without esophagitis: Secondary | ICD-10-CM | POA: Diagnosis present

## 2017-03-28 DIAGNOSIS — I120 Hypertensive chronic kidney disease with stage 5 chronic kidney disease or end stage renal disease: Secondary | ICD-10-CM | POA: Diagnosis present

## 2017-03-28 DIAGNOSIS — E8770 Fluid overload, unspecified: Secondary | ICD-10-CM | POA: Diagnosis not present

## 2017-03-28 DIAGNOSIS — D631 Anemia in chronic kidney disease: Secondary | ICD-10-CM | POA: Diagnosis present

## 2017-03-28 DIAGNOSIS — D696 Thrombocytopenia, unspecified: Secondary | ICD-10-CM | POA: Diagnosis present

## 2017-03-28 DIAGNOSIS — T82510A Breakdown (mechanical) of surgically created arteriovenous fistula, initial encounter: Secondary | ICD-10-CM | POA: Diagnosis not present

## 2017-03-28 DIAGNOSIS — Y712 Prosthetic and other implants, materials and accessory cardiovascular devices associated with adverse incidents: Secondary | ICD-10-CM | POA: Diagnosis not present

## 2017-03-28 DIAGNOSIS — Z79899 Other long term (current) drug therapy: Secondary | ICD-10-CM

## 2017-03-28 DIAGNOSIS — Z992 Dependence on renal dialysis: Secondary | ICD-10-CM

## 2017-03-28 DIAGNOSIS — N19 Unspecified kidney failure: Secondary | ICD-10-CM | POA: Diagnosis present

## 2017-03-28 DIAGNOSIS — N319 Neuromuscular dysfunction of bladder, unspecified: Secondary | ICD-10-CM | POA: Diagnosis present

## 2017-03-28 DIAGNOSIS — N186 End stage renal disease: Secondary | ICD-10-CM | POA: Diagnosis not present

## 2017-03-28 DIAGNOSIS — Z880 Allergy status to penicillin: Secondary | ICD-10-CM

## 2017-03-28 DIAGNOSIS — I10 Essential (primary) hypertension: Secondary | ICD-10-CM | POA: Diagnosis present

## 2017-03-28 DIAGNOSIS — H9190 Unspecified hearing loss, unspecified ear: Secondary | ICD-10-CM | POA: Diagnosis present

## 2017-03-28 DIAGNOSIS — Z886 Allergy status to analgesic agent status: Secondary | ICD-10-CM

## 2017-03-28 DIAGNOSIS — E875 Hyperkalemia: Secondary | ICD-10-CM | POA: Diagnosis not present

## 2017-03-28 DIAGNOSIS — D61818 Other pancytopenia: Secondary | ICD-10-CM | POA: Diagnosis not present

## 2017-03-28 DIAGNOSIS — N2581 Secondary hyperparathyroidism of renal origin: Secondary | ICD-10-CM | POA: Diagnosis present

## 2017-03-28 DIAGNOSIS — F79 Unspecified intellectual disabilities: Secondary | ICD-10-CM | POA: Diagnosis present

## 2017-03-28 DIAGNOSIS — I480 Paroxysmal atrial fibrillation: Secondary | ICD-10-CM | POA: Diagnosis present

## 2017-03-28 DIAGNOSIS — Z9115 Patient's noncompliance with renal dialysis: Secondary | ICD-10-CM

## 2017-03-28 HISTORY — DX: Dependence on renal dialysis: N18.6

## 2017-03-28 HISTORY — DX: Dyspnea, unspecified: R06.00

## 2017-03-28 HISTORY — DX: End stage renal disease: Z99.2

## 2017-03-28 LAB — CBC
HEMATOCRIT: 27.9 % — AB (ref 39.0–52.0)
HEMOGLOBIN: 8.8 g/dL — AB (ref 13.0–17.0)
MCH: 26.7 pg (ref 26.0–34.0)
MCHC: 31.5 g/dL (ref 30.0–36.0)
MCV: 84.8 fL (ref 78.0–100.0)
Platelets: 116 10*3/uL — ABNORMAL LOW (ref 150–400)
RBC: 3.29 MIL/uL — AB (ref 4.22–5.81)
RDW: 16.6 % — ABNORMAL HIGH (ref 11.5–15.5)
WBC: 3.9 10*3/uL — AB (ref 4.0–10.5)

## 2017-03-28 LAB — COMPREHENSIVE METABOLIC PANEL
ALBUMIN: 3.9 g/dL (ref 3.5–5.0)
ALT: 12 U/L — ABNORMAL LOW (ref 17–63)
ANION GAP: 20 — AB (ref 5–15)
AST: 23 U/L (ref 15–41)
Alkaline Phosphatase: 55 U/L (ref 38–126)
BILIRUBIN TOTAL: 1.2 mg/dL (ref 0.3–1.2)
BUN: 119 mg/dL — ABNORMAL HIGH (ref 6–20)
CO2: 17 mmol/L — ABNORMAL LOW (ref 22–32)
Calcium: 8.3 mg/dL — ABNORMAL LOW (ref 8.9–10.3)
Chloride: 101 mmol/L (ref 101–111)
Creatinine, Ser: 27.64 mg/dL — ABNORMAL HIGH (ref 0.61–1.24)
GFR calc Af Amer: 2 mL/min — ABNORMAL LOW (ref >60)
GFR calc non Af Amer: 2 mL/min — ABNORMAL LOW (ref >60)
GLUCOSE: 87 mg/dL (ref 65–99)
POTASSIUM: 6.3 mmol/L — AB (ref 3.5–5.1)
Sodium: 138 mmol/L (ref 135–145)
Total Protein: 7 g/dL (ref 6.5–8.1)

## 2017-03-28 LAB — CBG MONITORING, ED: Glucose-Capillary: 78 mg/dL (ref 65–99)

## 2017-03-28 MED ORDER — ONDANSETRON HCL 4 MG/2ML IJ SOLN: 4.0000 mg | Freq: Three times a day (TID) | INTRAMUSCULAR | Status: DC | PRN

## 2017-03-28 MED ORDER — SODIUM POLYSTYRENE SULFONATE 15 GM/60ML PO SUSP
30.0000 g | Freq: Once | ORAL | Status: AC
  Administered 2017-03-28: 30 g via ORAL
  Filled 2017-03-28: qty 120

## 2017-03-28 MED ORDER — METOPROLOL SUCCINATE ER 25 MG PO TB24
25.0000 mg | ORAL_TABLET | Freq: Every day | ORAL | Status: DC
  Administered 2017-03-29 – 2017-03-31 (×4): 25 mg via ORAL
  Filled 2017-03-28 (×4): qty 1

## 2017-03-28 MED ORDER — CALCIUM ACETATE (PHOS BINDER) 667 MG PO CAPS
667.0000 mg | ORAL_CAPSULE | Freq: Three times a day (TID) | ORAL | Status: DC
  Administered 2017-03-29 (×2): 667 mg via ORAL
  Filled 2017-03-28 (×2): qty 1

## 2017-03-28 MED ORDER — PANTOPRAZOLE SODIUM 40 MG PO TBEC
40.0000 mg | DELAYED_RELEASE_TABLET | Freq: Every day | ORAL | Status: DC
  Administered 2017-03-29 – 2017-04-08 (×12): 40 mg via ORAL
  Filled 2017-03-28 (×12): qty 1

## 2017-03-28 MED ORDER — SEVELAMER CARBONATE 800 MG PO TABS: 800.0000 mg | ORAL_TABLET | ORAL | Status: DC | PRN

## 2017-03-28 MED ORDER — IRBESARTAN 300 MG PO TABS
300.0000 mg | ORAL_TABLET | Freq: Every day | ORAL | Status: DC
Start: 2017-03-29 — End: 2017-04-08
  Administered 2017-03-30 – 2017-04-08 (×10): 300 mg via ORAL
  Filled 2017-03-28 (×10): qty 1

## 2017-03-28 MED ORDER — DEXTROSE 50 % IV SOLN
50.0000 mL | Freq: Once | INTRAVENOUS | Status: AC
  Administered 2017-03-28: 50 mL via INTRAVENOUS
  Filled 2017-03-28: qty 50

## 2017-03-28 MED ORDER — INSULIN ASPART 100 UNIT/ML IV SOLN
5.0000 [IU] | Freq: Once | INTRAVENOUS | Status: DC
  Administered 2017-03-28: 3 [IU] via INTRAVENOUS
  Filled 2017-03-28: qty 0.05

## 2017-03-28 MED ORDER — CLONIDINE HCL 0.3 MG PO TABS
0.3000 mg | ORAL_TABLET | Freq: Two times a day (BID) | ORAL | Status: DC
  Administered 2017-03-29 – 2017-04-03 (×11): 0.3 mg via ORAL
  Filled 2017-03-28 (×12): qty 1

## 2017-03-28 MED ORDER — HYDRALAZINE HCL 20 MG/ML IJ SOLN
5.0000 mg | INTRAMUSCULAR | Status: DC | PRN
  Administered 2017-03-29 – 2017-04-02 (×2): 5 mg via INTRAVENOUS

## 2017-03-28 MED ORDER — SEVELAMER CARBONATE 800 MG PO TABS
1600.0000 mg | ORAL_TABLET | Freq: Three times a day (TID) | ORAL | Status: DC
  Administered 2017-03-29 – 2017-04-08 (×22): 1600 mg via ORAL
  Filled 2017-03-28 (×22): qty 2

## 2017-03-28 MED ORDER — RENA-VITE PO TABS
1.0000 | ORAL_TABLET | Freq: Every day | ORAL | Status: DC
  Administered 2017-03-29 – 2017-04-05 (×8): 1 via ORAL
  Filled 2017-03-28 (×8): qty 1

## 2017-03-28 MED ORDER — HEPARIN SODIUM (PORCINE) 5000 UNIT/ML IJ SOLN
5000.0000 [IU] | Freq: Three times a day (TID) | INTRAMUSCULAR | Status: DC
  Administered 2017-03-29 – 2017-04-08 (×20): 5000 [IU] via SUBCUTANEOUS
  Filled 2017-03-28 (×22): qty 1

## 2017-03-28 MED ORDER — INSULIN ASPART 100 UNIT/ML IV SOLN: 3.0000 [IU] | Freq: Once | INTRAVENOUS | Status: DC

## 2017-03-28 MED ORDER — DOXAZOSIN MESYLATE 2 MG PO TABS
4.0000 mg | ORAL_TABLET | Freq: Every day | ORAL | Status: DC
  Administered 2017-03-29 – 2017-04-08 (×11): 4 mg via ORAL
  Filled 2017-03-28 (×6): qty 2
  Filled 2017-03-28: qty 1
  Filled 2017-03-28 (×5): qty 2
  Filled 2017-03-28: qty 1

## 2017-03-28 MED ORDER — AMLODIPINE BESYLATE 10 MG PO TABS
10.0000 mg | ORAL_TABLET | Freq: Every day | ORAL | Status: DC
  Administered 2017-03-29 – 2017-04-08 (×11): 10 mg via ORAL
  Filled 2017-03-28 (×11): qty 1

## 2017-03-28 MED ORDER — FUROSEMIDE 40 MG PO TABS
40.0000 mg | ORAL_TABLET | Freq: Every day | ORAL | Status: DC
  Administered 2017-03-29 – 2017-04-08 (×11): 40 mg via ORAL
  Filled 2017-03-28 (×11): qty 1

## 2017-03-28 MED ORDER — ZOLPIDEM TARTRATE 5 MG PO TABS: 5.0000 mg | ORAL_TABLET | Freq: Every evening | ORAL | Status: DC | PRN

## 2017-03-28 MED ORDER — SODIUM CHLORIDE 0.9 % IV SOLN
1.0000 g | Freq: Once | INTRAVENOUS | Status: AC
  Administered 2017-03-28: 1 g via INTRAVENOUS
  Filled 2017-03-28: qty 10

## 2017-03-28 MED ORDER — CINACALCET HCL 30 MG PO TABS
30.0000 mg | ORAL_TABLET | ORAL | Status: DC
  Administered 2017-03-30 – 2017-04-06 (×4): 30 mg via ORAL
  Filled 2017-03-28 (×4): qty 1

## 2017-03-28 MED ORDER — ACETAMINOPHEN 500 MG PO TABS
500.0000 mg | ORAL_TABLET | Freq: Four times a day (QID) | ORAL | Status: DC | PRN
  Administered 2017-03-30: 500 mg via ORAL
  Filled 2017-03-28: qty 1

## 2017-03-28 NOTE — ED Triage Notes (Signed)
Per Pt, Pt is coming from home with complaints of needing to  Be dialysized today. Pt was last dialysized on Saturday. Pt reports having trouble with transportation and his clinic.

## 2017-03-28 NOTE — ED Provider Notes (Signed)
Emergency Department Provider Note   I have reviewed the triage vital signs and the nursing notes.   HISTORY  Chief Complaint Needs Dialysis   HPI John Woodard is a 46 y.o. male who is on dialysis that presents to ER 2/2 needing dialysis. No CP, SOB but has not had dialysis in last week and a half because of some type of transportation issues. No issues before this. No other associated or modifying symptoms.    Past Medical History:  Diagnosis Date  . Dialysis patient ([place])   . ESRD on dialysis ([place])   . Hypertension   . Renal disorder     Patient Active Problem List   Diagnosis Date Noted  . Hyperkalemia 03/28/2017  . Uremia 03/28/2017  . Pancytopenia ([place]) 03/28/2017  . GERD (gastroesophageal reflux disease) 03/28/2017  . Hypertension   . ESRD on dialysis [facility])     Past Surgical History:  Procedure Laterality Date  . AVF Left       Allergies Naproxen; Lactose intolerance (gi); and Penicillins  Family History  Problem Relation Age of Onset  . Hypertension Mother   . Diabetes Mellitus II Sister     Social History Social History  Substance Use Topics  . Smoking status: Never Smoker  . Smokeless tobacco: Not on file  . Alcohol use No    Review of Systems  All other systems negative except as documented in the HPI. All pertinent positives and negatives as reviewed in the HPI. ____________________________________________  PHYSICAL EXAM:  VITAL SIGNS: ED Triage Vitals  Enc Vitals Group     BP 03/28/17 1441 (!) 177/127     Pulse Rate 03/28/17 1439 74     Resp 03/28/17 1439 18     Temp 03/28/17 1439 97.8 F (36.6 C)     Temp Source 03/28/17 1439 Oral     SpO2 03/28/17 1439 98 %     Weight 03/28/17 1439 180 lb (81.6 kg)     Height 03/28/17 1439 6\' 2"  (1.88 m)     Head Circumference --      Peak Flow --      Pain Score 03/28/17 1438 0     Pain Loc --      Pain Edu? --      Excl. in [place]? --     Constitutional: Alert and oriented. Well  appearing and in no acute distress. Eyes: Conjunctivae are normal. PERRL. EOMI. Head: Atraumatic. Nose: No congestion/rhinnorhea. Mouth/Throat: Mucous membranes are moist.  Oropharynx non-erythematous. Neck: No stridor.  No meningeal signs.   Cardiovascular: Normal rate, regular rhythm. Good peripheral circulation. Grossly normal heart sounds.   Respiratory: Normal respiratory effort.  No retractions. Lungs CTAB. Gastrointestinal: Soft and nontender. No distention.  Musculoskeletal: No lower extremity tenderness nor edema. No gross deformities of extremities. Neurologic:  Normal speech and language. No gross focal neurologic deficits are appreciated.  Skin:  Skin is warm, dry and intact. No rash noted.  ____________________________________________   LABS (all labs ordered are listed, but only abnormal results are displayed)  Labs Reviewed  COMPREHENSIVE METABOLIC PANEL - Abnormal; Notable for the following:       Result Value   Potassium 6.3 (*)    CO2 17 (*)    BUN 119 (*)    Creatinine, Ser 27.64 (*)    Calcium 8.3 (*)    ALT 12 (*)    GFR calc non Af Amer 2 (*)    GFR calc Af Amer 2 (*)  Anion gap 20 (*)    All other components within normal limits  CBC - Abnormal; Notable for the following:    WBC 3.9 (*)    RBC 3.29 (*)    Hemoglobin 8.8 (*)    HCT 27.9 (*)    RDW 16.6 (*)    Platelets 116 (*)    All other components within normal limits  VITAMIN B12  FOLATE  IRON AND TIBC  FERRITIN  RETICULOCYTES  HIV ANTIBODY (ROUTINE TESTING)  BASIC METABOLIC PANEL  CBC  GLUCOSE, CAPILLARY  CBG MONITORING, ED   ____________________________________________  EKG   EKG Interpretation  Date/Time:  Saturday March 28 2017 18:08:05 EDT Ventricular Rate:  82 PR Interval:    QRS Duration: 87 QT Interval:  377 QTC Calculation: 441 R Axis:   -79 Text Interpretation:  Atrial fibrillation Left anterior fascicular block Anteroseptal infarct, age indeterminate peaked T  waves in V4 Confirmed by Merrily Pew [phone]) on 03/28/2017 6:39:18 PM       ____________________________________________  RADIOLOGY  Dg Chest 2 View  Result Date: 03/28/2017 CLINICAL DATA:  46 year old male in need of dialysis. Evaluate for potential pulmonary edema. EXAM: CHEST  2 VIEW COMPARISON:  Chest x-ray 10/30/2016. FINDINGS: There is mild cephalization of the pulmonary vasculature and slight indistinctness of the interstitial markings suggestive of mild pulmonary edema. No pleural effusions. Mild cardiomegaly. Upper mediastinal contours are within normal limits. IMPRESSION: 1. Mild interstitial pulmonary edema. 2. Mild cardiomegaly. Electronically Signed   By: Vinnie Langton M.D.   On: 03/28/2017 19:31   ____________________________________________   PROCEDURES  Procedure(s) performed:   Procedures ____________________________________________   INITIAL IMPRESSION / ASSESSMENT AND PLAN / ED COURSE  Pertinent labs & imaging results that were available during my care of the patient were reviewed by me and considered in my medical decision making (see chart for details).  Needs dialysis and social situation figured out. Discussed with Dr. Florene Glen who stated if not in pulmonary edema, no need for emergent dialysis. He stated he would see them in the morning if the patient was admitted.   This patient had mild Rales, potassium 6.3, pulmonary edema on his x-ray felt the patient needed to be admitted until he had dialysis. Discussed with the hospitalist who will admit.  ____________________________________________  FINAL CLINICAL IMPRESSION(S) / ED DIAGNOSES  Final diagnoses:  Essential hypertension  ESRD on dialysis ([place])    MEDICATIONS GIVEN DURING THIS VISIT:  Medications  acetaminophen (TYLENOL) tablet 500 mg (not administered)  amLODipine (NORVASC) tablet 10 mg (not administered)  calcium acetate (PHOSLO) capsule 667 mg (not administered)  cinacalcet (SENSIPAR)  tablet 30 mg (not administered)  cloNIDine (CATAPRES) tablet 0.3 mg (not administered)  doxazosin (CARDURA) tablet 4 mg (not administered)  pantoprazole (PROTONIX) EC tablet 40 mg (not administered)  furosemide (LASIX) tablet 40 mg (not administered)  metoprolol succinate (TOPROL-XL) 24 hr tablet 25 mg (not administered)  sevelamer carbonate (RENVELA) tablet 1,600 mg (not administered)  multivitamin (RENA-VIT) tablet 1 tablet (not administered)  irbesartan (AVAPRO) tablet 300 mg (not administered)  hydrALAZINE (APRESOLINE) injection 5 mg (not administered)  ondansetron (ZOFRAN) injection 4 mg (not administered)  zolpidem (AMBIEN) tablet 5 mg (not administered)  heparin injection 5,000 Units (not administered)  insulin aspart (novoLOG) injection 3 Units (3 Units Intravenous Not Given 03/28/17 2241)  sevelamer carbonate (RENVELA) tablet 800 mg (not administered)  sodium polystyrene (KAYEXALATE) 15 GM/60ML suspension 30 g (30 g Oral Given 03/28/17 2026)  calcium gluconate 1 g in sodium chloride 0.9 %  100 mL IVPB (0 g Intravenous Stopped 03/28/17 2046)  dextrose 50 % solution 50 mL (50 mLs Intravenous Given 03/28/17 2237)    NEW OUTPATIENT MEDICATIONS STARTED DURING THIS VISIT:  Current Discharge Medication List      Note:  This document was prepared using Dragon voice recognition software and may include unintentional dictation errors.    Aleisa Howk, Corene Cornea, MD 03/29/17 [phone]

## 2017-03-28 NOTE — ED Notes (Signed)
Pt called x3 for vitals recheck. No answer. 

## 2017-03-28 NOTE — H&P (Signed)
History and Physical    Narciso Stoutenburg CHE:[id number] DOB: 09-10-1970 DOA: 03/28/2017  Referring MD/NP/PA:   PCP: Default, Provider, MD   Patient coming from:  The patient is coming from home.  At baseline, pt is independent for most of ADL.   Chief Complaint: need dialysis  HPI: Racer Quam is a 46 y.o. male with medical history significant of hypertension, GERD, ESRD on dialysis, poor hearing, who presents with the need for dialysis.  Patient states that his dialysis schedule is Tuesday and Thursday and Saturday. Because of the transportation issue, he missed 4 sessions of dialysis. Last dialysis was done on last Thursday. He feels that he need dialysis now and therefore comes to ED. He denies shortness of breath, chest pain, cough, fever or chills. No nausea, vomiting, diarrhea, abdominal pain, symptoms of UTI or unilateral weakness.  ED Course: pt was found to have potassium 6.3 with T-wave peaking in V4 on EKG, bicarbonate 17, creatinine 27.6, BUN 119, pancytopenia with WBC 3.9, hemoglobin 8.8 and platelet 116, temperature normal, no tachycardia, oxygen sats 99% on room air, chest x-ray showed mild pulmonary edema. Patient is placed on telemetry bed for observation. Renal, Dr. Florene Glen was consulted.  Review of Systems:   General: no fevers, chills, no body weight gain, has poor appetite, has fatigue HEENT: no blurry vision, or sore throat. Poor hearing. Respiratory: no dyspnea, coughing, wheezing CV: no chest pain, no palpitations GI: no nausea, vomiting, abdominal pain, diarrhea, constipation GU: no dysuria, burning on urination, increased urinary frequency, hematuria  Ext: no leg edema Neuro: no unilateral weakness, numbness, or tingling, no vision change or hearing loss Skin: no rash, no skin tear. MSK: No muscle spasm, no deformity, no limitation of range of movement in spin Heme: No easy bruising.  Travel history: No recent long distant travel.  Allergy:  Allergies  Allergen  Reactions  . Naproxen Other (See Comments)    "heart stopped one time"  . Lactose Intolerance (Gi) Diarrhea and Other (See Comments)    Blood in stools  . Penicillins Other (See Comments)    unknown    Past Medical History:  Diagnosis Date  . Dialysis patient ([place])   . ESRD on dialysis ([place])   . Hypertension   . Renal disorder     Past Surgical History:  Procedure Laterality Date  . AVF Left     Social History:  reports that he has never smoked. He does not have any smokeless tobacco history on file. He reports that he does not drink alcohol or use drugs.  Family History:  Family History  Problem Relation Age of Onset  . Hypertension Mother   . Diabetes Mellitus II Sister      Prior to Admission medications   Medication Sig Start Date End Date Taking? Authorizing Provider  acetaminophen (TYLENOL) 500 MG tablet Take 500 mg by mouth every 6 (six) hours as needed for moderate pain.    [provider]  amLODipine (NORVASC) 10 MG tablet Take 10 mg by mouth at bedtime.    [provider]  calcium acetate (PHOSLO) 667 MG capsule Take 667 mg by mouth 3 (three) times daily with meals.    [provider]  cinacalcet (SENSIPAR) 30 MG tablet Take 30 mg by mouth every Monday, Wednesday, and Friday.     [provider]  cloNIDine (CATAPRES) 0.3 MG tablet Take 0.3 mg by mouth 2 (two) times daily.    [provider]  doxazosin (CARDURA) 4 MG tablet  Take 4 mg by mouth at bedtime.    [provider]  esomeprazole (NEXIUM) 40 MG capsule Take 40 mg by mouth daily before breakfast.    [provider]  furosemide (LASIX) 40 MG tablet Take 40 mg by mouth daily.    [provider]  metoprolol succinate (TOPROL-XL) 25 MG 24 hr tablet Take 25 mg by mouth at bedtime.     [provider]  multivitamin (RENA-VIT) TABS tablet Take 1 tablet by mouth daily.    [provider]  sevelamer carbonate (RENVELA) 800 MG  tablet Take 800 mg by mouth 4 (four) times daily.     [provider]  valsartan (DIOVAN) 320 MG tablet Take 320 mg by mouth at bedtime.    [provider]    Physical Exam: Vitals:   03/28/17 1439 03/28/17 1441 03/28/17 2100  BP:  (!) 177/127   Pulse: 74  80  Resp: 18  11  Temp: 97.8 F (36.6 C)    TempSrc: Oral    SpO2: 98%  99%  Weight: 81.6 kg (180 lb)    Height: 6\' 2"  (1.88 m)     General: Not in acute distress HEENT:       Eyes: PERRL, EOMI, no scleral icterus.       ENT: No discharge from the ears and nose, no pharynx injection, no tonsillar enlargement.        Neck: No JVD, no bruit, no mass felt. Heme: No neck lymph node enlargement. Cardiac: S1/S2, RRR, No murmurs, No gallops or rubs. Respiratory:  No rales, wheezing, rhonchi or rubs. GI: Soft, nondistended, nontender, no rebound pain, no organomegaly, BS present. GU: No hematuria Ext: No pitting leg edema bilaterally. 2+DP/PT pulse bilaterally. Musculoskeletal: No joint deformities, No joint redness or warmth, no limitation of ROM in spin. Skin: No rashes.  Neuro: Alert, oriented X3, cranial nerves II-XII grossly intact, moves all extremities normally.  Psych: Patient is not psychotic, no suicidal or hemocidal ideation.  Labs on Admission: I have personally reviewed following labs and imaging studies  CBC:  Recent Labs Lab 03/28/17 1512  WBC 3.9*  HGB 8.8*  HCT 27.9*  MCV 84.8  PLT 161*   Basic Metabolic Panel:  Recent Labs Lab 03/28/17 1512  NA 138  K 6.3*  CL 101  CO2 17*  GLUCOSE 87  BUN 119*  CREATININE 27.64*  CALCIUM 8.3*   GFR: Estimated Creatinine Clearance: 3.8 mL/min (A) (by C-G formula based on SCr of 27.64 mg/dL (H)). Liver Function Tests:  Recent Labs Lab 03/28/17 1512  AST 23  ALT 12*  ALKPHOS 55  BILITOT 1.2  PROT 7.0  ALBUMIN 3.9   No results for input(s): LIPASE, AMYLASE in the last 168 hours. No results for input(s): AMMONIA in the last 168  hours. Coagulation Profile: No results for input(s): INR, PROTIME in the last 168 hours. Cardiac Enzymes: No results for input(s): CKTOTAL, CKMB, CKMBINDEX, TROPONINI in the last 168 hours. BNP (last 3 results) No results for input(s): PROBNP in the last 8760 hours. HbA1C: No results for input(s): HGBA1C in the last 72 hours. CBG: No results for input(s): GLUCAP in the last 168 hours. Lipid Profile: No results for input(s): CHOL, HDL, LDLCALC, TRIG, CHOLHDL, LDLDIRECT in the last 72 hours. Thyroid Function Tests: No results for input(s): TSH, T4TOTAL, FREET4, T3FREE, THYROIDAB in the last 72 hours. Anemia Panel: No results for input(s): VITAMINB12, FOLATE, FERRITIN, TIBC, IRON, RETICCTPCT in the last 72 hours. Urine analysis:  Component Value Date/Time   COLORURINE YELLOW 10/01/2007 2150   APPEARANCEUR CLOUDY (A) 10/01/2007 2150   LABSPEC 1.014 10/01/2007 2150   PHURINE 8.0 10/01/2007 2150   GLUCOSEU NEGATIVE 10/01/2007 2150   HGBUR MODERATE (A) 10/01/2007 2150   BILIRUBINUR NEGATIVE 10/01/2007 2150   KETONESUR NEGATIVE 10/01/2007 2150   PROTEINUR >300 (A) 10/01/2007 2150   UROBILINOGEN 0.2 10/01/2007 2150   NITRITE NEGATIVE 10/01/2007 2150   LEUKOCYTESUR LARGE (A) 10/01/2007 2150   Sepsis Labs: @LABRCNTIP (procalcitonin:4,lacticidven:4) )No results found for this or any previous visit (from the past 240 hour(s)).   Radiological Exams on Admission: Dg Chest 2 View  Result Date: 03/28/2017 CLINICAL DATA:  46 year old male in need of dialysis. Evaluate for potential pulmonary edema. EXAM: CHEST  2 VIEW COMPARISON:  Chest x-ray 10/30/2016. FINDINGS: There is mild cephalization of the pulmonary vasculature and slight indistinctness of the interstitial markings suggestive of mild pulmonary edema. No pleural effusions. Mild cardiomegaly. Upper mediastinal contours are within normal limits. IMPRESSION: 1. Mild interstitial pulmonary edema. 2. Mild cardiomegaly. Electronically Signed    By: Vinnie Langton M.D.   On: 03/28/2017 19:31     EKG: Independently reviewed. Sinus rhythm, QTC 441, LAD, anteroseptal infarction pattern, poor R-wave progression, T-wave peaking in V4.   Assessment/Plan Principal Problem:   Hyperkalemia Active Problems:   Hypertension   ESRD on dialysis ([place])   Uremia   Pancytopenia (H[facility])   GERD (gastroesophageal reflux disease)   Hyperkalemia: due to missed dialysis. potassium 6.3 with T-wave peaking in V4 on EKG. -will place on tele bed for obs -D50 50 cc x 1 and Novolog 3 U -1 g of calcium gluconate IV -Kayexalate 30 g 1 -consulted renal for HD  ESRD on dialysis [facility]):  Pt missed 4 sessions of dialysis. Potassium 6.3, bicarbonate 17, creatinine 27.6, BUN 119. Renal Dr. Florene Glen was consulted by EDP. Since pt dose not have respiratory distress and mental status normal, they will do HD in AM. -Left message to renal box for HD in AM -Continue PhosLo, Sensipar, Renvela  Hypertension: -continue amlodipine, clonidine, doxazosin, metoprolol, Diovan -also on lasix  Pancytopenia ([place]): WBC 3.9, hemoglobin 8.8 and platelet 116. His hemoglobin was 9.6 on 08/03/10. Etiology is not clear. Likely due to ESRD. -will check anemia panel  GERD: -Protonix   DVT ppx: SQ Heparin   Code Status: Full code Family Communication: None at bed side.  Disposition Plan:  Anticipate discharge back to previous home environment Consults called:  Renal, Dr. Florene Glen Admission status: Obs / tele   Date of Service 03/28/2017    Ivor Costa [facility] Pager [phone]  If 7PM-7AM, please contact night-coverage [email or web address] Password [email or web address] 03/28/2017, 10:41 PM

## 2017-03-28 NOTE — ED Notes (Signed)
BNP added to blood in main  lab.

## 2017-03-28 NOTE — ED Notes (Addendum)
Lab called regarding critical lab value; Potassium 6.3; Triage RN notified

## 2017-03-28 NOTE — ED Notes (Signed)
Pt transported to xray 

## 2017-03-28 NOTE — ED Notes (Signed)
Pt seems to be hard of hearing.

## 2017-03-28 NOTE — ED Notes (Signed)
Pt called 3X to be taken back to room; No response

## 2017-03-29 ENCOUNTER — Encounter (HOSPITAL_COMMUNITY): Payer: Self-pay | Admitting: *Deleted

## 2017-03-29 DIAGNOSIS — E8770 Fluid overload, unspecified: Secondary | ICD-10-CM | POA: Diagnosis present

## 2017-03-29 DIAGNOSIS — Z9115 Patient's noncompliance with renal dialysis: Secondary | ICD-10-CM | POA: Diagnosis not present

## 2017-03-29 DIAGNOSIS — Y712 Prosthetic and other implants, materials and accessory cardiovascular devices associated with adverse incidents: Secondary | ICD-10-CM | POA: Diagnosis not present

## 2017-03-29 DIAGNOSIS — I120 Hypertensive chronic kidney disease with stage 5 chronic kidney disease or end stage renal disease: Secondary | ICD-10-CM | POA: Diagnosis present

## 2017-03-29 DIAGNOSIS — F79 Unspecified intellectual disabilities: Secondary | ICD-10-CM | POA: Diagnosis present

## 2017-03-29 DIAGNOSIS — Z992 Dependence on renal dialysis: Secondary | ICD-10-CM | POA: Diagnosis not present

## 2017-03-29 DIAGNOSIS — I10 Essential (primary) hypertension: Secondary | ICD-10-CM | POA: Diagnosis not present

## 2017-03-29 DIAGNOSIS — H9190 Unspecified hearing loss, unspecified ear: Secondary | ICD-10-CM | POA: Diagnosis present

## 2017-03-29 DIAGNOSIS — E875 Hyperkalemia: Secondary | ICD-10-CM | POA: Diagnosis not present

## 2017-03-29 DIAGNOSIS — N186 End stage renal disease: Secondary | ICD-10-CM | POA: Diagnosis present

## 2017-03-29 DIAGNOSIS — N319 Neuromuscular dysfunction of bladder, unspecified: Secondary | ICD-10-CM | POA: Diagnosis present

## 2017-03-29 DIAGNOSIS — D631 Anemia in chronic kidney disease: Secondary | ICD-10-CM | POA: Diagnosis present

## 2017-03-29 DIAGNOSIS — T82898A Other specified complication of vascular prosthetic devices, implants and grafts, initial encounter: Secondary | ICD-10-CM | POA: Diagnosis not present

## 2017-03-29 DIAGNOSIS — D61818 Other pancytopenia: Secondary | ICD-10-CM | POA: Diagnosis present

## 2017-03-29 DIAGNOSIS — Z880 Allergy status to penicillin: Secondary | ICD-10-CM | POA: Diagnosis not present

## 2017-03-29 DIAGNOSIS — T82510A Breakdown (mechanical) of surgically created arteriovenous fistula, initial encounter: Secondary | ICD-10-CM | POA: Diagnosis not present

## 2017-03-29 DIAGNOSIS — K219 Gastro-esophageal reflux disease without esophagitis: Secondary | ICD-10-CM | POA: Diagnosis present

## 2017-03-29 DIAGNOSIS — Z886 Allergy status to analgesic agent status: Secondary | ICD-10-CM | POA: Diagnosis not present

## 2017-03-29 DIAGNOSIS — Z79899 Other long term (current) drug therapy: Secondary | ICD-10-CM | POA: Diagnosis not present

## 2017-03-29 DIAGNOSIS — N2581 Secondary hyperparathyroidism of renal origin: Secondary | ICD-10-CM | POA: Diagnosis present

## 2017-03-29 DIAGNOSIS — I480 Paroxysmal atrial fibrillation: Secondary | ICD-10-CM | POA: Diagnosis present

## 2017-03-29 DIAGNOSIS — I4891 Unspecified atrial fibrillation: Secondary | ICD-10-CM | POA: Diagnosis not present

## 2017-03-29 LAB — BASIC METABOLIC PANEL
ANION GAP: 18 — AB (ref 5–15)
BUN: 118 mg/dL — AB (ref 6–20)
CHLORIDE: 99 mmol/L — AB (ref 101–111)
CO2: 19 mmol/L — AB (ref 22–32)
Calcium: 8 mg/dL — ABNORMAL LOW (ref 8.9–10.3)
Creatinine, Ser: 31.86 mg/dL — ABNORMAL HIGH (ref 0.61–1.24)
GFR calc Af Amer: 2 mL/min — ABNORMAL LOW (ref >60)
GFR calc non Af Amer: 1 mL/min — ABNORMAL LOW (ref >60)
GLUCOSE: 67 mg/dL (ref 65–99)
POTASSIUM: 4.7 mmol/L (ref 3.5–5.1)
Sodium: 136 mmol/L (ref 135–145)

## 2017-03-29 LAB — RETICULOCYTES
RBC.: 3.3 MIL/uL — ABNORMAL LOW (ref 4.22–5.81)
RETIC COUNT ABSOLUTE: 135.3 10*3/uL (ref 19.0–186.0)
Retic Ct Pct: 4.1 % — ABNORMAL HIGH (ref 0.4–3.1)

## 2017-03-29 LAB — GLUCOSE, CAPILLARY
GLUCOSE-CAPILLARY: 67 mg/dL (ref 65–99)
Glucose-Capillary: 77 mg/dL (ref 65–99)
Glucose-Capillary: 150 mg/dL — ABNORMAL HIGH (ref 65–99)
Glucose-Capillary: 85 mg/dL (ref 65–99)

## 2017-03-29 LAB — IRON AND TIBC
IRON: 57 ug/dL (ref 45–182)
SATURATION RATIOS: 24 % (ref 17.9–39.5)
TIBC: 237 ug/dL — AB (ref 250–450)
UIBC: 180 ug/dL

## 2017-03-29 LAB — HIV ANTIBODY (ROUTINE TESTING W REFLEX): HIV SCREEN 4TH GENERATION: NONREACTIVE

## 2017-03-29 LAB — FOLATE: FOLATE: 41 ng/mL (ref >5.9)

## 2017-03-29 LAB — CBC
HEMATOCRIT: 28.2 % — AB (ref 39.0–52.0)
Hemoglobin: 8.7 g/dL — ABNORMAL LOW (ref 13.0–17.0)
MCH: 26.3 pg (ref 26.0–34.0)
MCHC: 30.9 g/dL (ref 30.0–36.0)
MCV: 85.2 fL (ref 78.0–100.0)
Platelets: 106 10*3/uL — ABNORMAL LOW (ref 150–400)
RBC: 3.31 MIL/uL — ABNORMAL LOW (ref 4.22–5.81)
RDW: 16.4 % — AB (ref 11.5–15.5)
WBC: 4.4 10*3/uL (ref 4.0–10.5)

## 2017-03-29 LAB — MRSA PCR SCREENING: MRSA by PCR: NEGATIVE

## 2017-03-29 LAB — FERRITIN: Ferritin: 526 ng/mL — ABNORMAL HIGH (ref 24–336)

## 2017-03-29 LAB — VITAMIN B12: Vitamin B-12: 2030 pg/mL — ABNORMAL HIGH (ref 180–914)

## 2017-03-29 MED ORDER — LIDOCAINE-PRILOCAINE 2.5-2.5 % EX CREA: 1.0000 "application " | TOPICAL_CREAM | CUTANEOUS | Status: DC | PRN

## 2017-03-29 MED ORDER — HEPARIN SODIUM (PORCINE) 1000 UNIT/ML DIALYSIS: 20.0000 [IU]/kg | INTRAMUSCULAR | Status: DC | PRN

## 2017-03-29 MED ORDER — ALTEPLASE 2 MG IJ SOLR: 2.0000 mg | Freq: Once | INTRAMUSCULAR | Status: DC | PRN

## 2017-03-29 MED ORDER — LIDOCAINE HCL (PF) 1 % IJ SOLN: 5.0000 mL | INTRAMUSCULAR | Status: DC | PRN

## 2017-03-29 MED ORDER — SODIUM CHLORIDE 0.9 % IV SOLN: 100.0000 mL | INTRAVENOUS | Status: DC | PRN

## 2017-03-29 MED ORDER — HYDRALAZINE HCL 20 MG/ML IJ SOLN
INTRAMUSCULAR | Status: AC
  Administered 2017-03-29: 5 mg via INTRAVENOUS
  Filled 2017-03-29: qty 1

## 2017-03-29 MED ORDER — PENTAFLUOROPROP-TETRAFLUOROETH EX AERO: 1.0000 "application " | INHALATION_SPRAY | CUTANEOUS | Status: DC | PRN

## 2017-03-29 MED ORDER — HEPARIN SODIUM (PORCINE) 1000 UNIT/ML DIALYSIS: 1000.0000 [IU] | INTRAMUSCULAR | Status: DC | PRN

## 2017-03-29 MED ORDER — SODIUM CHLORIDE 0.9 % IV SOLN
125.0000 mg | Freq: Once | INTRAVENOUS | Status: AC
  Administered 2017-03-29: 125 mg via INTRAVENOUS
  Filled 2017-03-29 (×2): qty 10

## 2017-03-29 MED ORDER — PNEUMOCOCCAL VAC POLYVALENT 25 MCG/0.5ML IJ INJ
0.5000 mL | INJECTION | INTRAMUSCULAR | Status: DC
  Filled 2017-03-29: qty 0.5

## 2017-03-29 NOTE — Progress Notes (Signed)
Patient arrived to unit per bed.  Reviewed treatment plan and this RN agrees.  Report received from bedside RN, [place].  Consent obtained.  Patient A& O to self, place. Lung sounds diminished to ausculation in all fields. BLE 3+ edema. Cardiac: Afib, NSR.  Prepped LLAVF with alcohol and cannulated with two 15 gauge needles.  Pulsation of blood noted.  Flushed access well with saline per protocol.  Connected and secured lines and initiated tx at 1937.  UF goal of 4000 mL and net fluid removal of 3500 mL.  Will continue to monitor.

## 2017-03-29 NOTE — Progress Notes (Addendum)
PROGRESS NOTE   John Woodard  MCN:[id number]    DOB: [birth year]    DOA: 03/28/2017  PCP: Default, Provider, MD   I have briefly reviewed patients previous medical records in [facility].  Brief Narrative:  46 year old male with PMH of hard of hearing, ESRD on TTS HD (Dr. Kriste Basque, Nephrology), HTN, GERD, missed 4 sessions of dialysis due to transportation issues and presented to the ED on 9/8 feeling that he needed dialysis now, found to have potassium of 6.3, bicarbonate 17, BUN 119 and creatinine of 27.6. Hyperkalemia treated with medications and resolved. Nephrology consulted and is awaiting HD today.   Assessment & Plan:   Principal Problem:   Hyperkalemia Active Problems:   Hypertension   ESRD on dialysis ([place])   Uremia   Pancytopenia (HCC)   GERD (gastroesophageal reflux disease)   1. Hyperkalemia: Due to missed dialysis. Presented with potassium of 6.3 with some EKG changes. Treated in ED with calcium gluconate, insulin and dextrose and Kayexalate. Hyperkalemia resolved. Nephrology has been consulted for HD and is supposed to get it this afternoon. 2. ESRD on TTS HD: Management as above. 3. Essential hypertension: Uncontrolled. Volume overloaded and this to be managed across HD. Continue amlodipine, clonidine, doxazosin, metoprolol, Diovan and Lasix. Polypharmacy suggests difficult to control hypertension. 4. Pancytopenia: Stable. 5. GERD: PPI. 6. Atrial fibrillation: Noted on telemetry and admitting EKG. Not known if this is old or new for him. Continue monitoring on telemetry. Repeat EKG. Obtain records from his outpatient physicians. Rate controlled. 7. Noncompliance: 8. ? Cognitive issues:   DVT prophylaxis: Subcutaneous heparin Code Status: Full Family Communication: None at bedside Disposition: DC home when medically improved   Consultants:  Nephrology   Procedures:  None  Antimicrobials:  None    Subjective: Seen this morning. Denied complaints.  No chest pain, dyspnea, cough, nausea or vomiting.   ROS: Negative  Objective:  Vitals:   03/28/17 2232 03/28/17 2351 03/29/17 0543 03/29/17 1015  BP: (!) 194/118 (!) 193/125 (!) 170/107 (!) 164/102  Pulse:  76 88 81  Resp:  18 19 19   Temp:  (!) 97.5 F (36.4 C) (!) 97.4 F (36.3 C) 98.1 F (36.7 C)  TempSrc:  Oral Oral Oral  SpO2:  99% 98% 97%  Weight:  88.7 kg (195 lb 8 oz)    Height:  6\' 2"  (1.88 m)      Examination:  General exam: Pleasant young male, moderately built and nourished, sitting up comfortably in bed. Respiratory system: Clear to auscultation. Respiratory effort normal. Cardiovascular system: S1 & S2 heard, RRR. No JVD, murmurs, rubs, gallops or clicks. No pedal edema. Telemetry: Atrial fibrillation with controlled ventricular rate. Gastrointestinal system: Abdomen is nondistended, soft and nontender. No organomegaly or masses felt. Normal bowel sounds heard. Central nervous system: Alert and oriented. No focal neurological deficits. Extremely hard of hearing. Extremities: Symmetric 5 x 5 power. Skin: No rashes, lesions or ulcers Psychiatry: Judgement and insight appear normal. Mood & affect appropriate.     Data Reviewed: I have personally reviewed following labs and imaging studies  CBC:  Recent Labs Lab 03/28/17 1512 03/29/17 0035  WBC 3.9* 4.4  HGB 8.8* 8.7*  HCT 27.9* 28.2*  MCV 84.8 85.2  PLT 116* 629*   Basic Metabolic Panel:  Recent Labs Lab 03/28/17 1512 03/29/17 0035  NA 138 136  K 6.3* 4.7  CL 101 99*  CO2 17* 19*  GLUCOSE 87 67  BUN 119* 118*  CREATININE 27.64* 31.86*  CALCIUM 8.3* 8.0*   Liver Function Tests:  Recent Labs Lab 03/28/17 1512  AST 23  ALT 12*  ALKPHOS 55  BILITOT 1.2  PROT 7.0  ALBUMIN 3.9   Coagulation Profile: No results for input(s): INR, PROTIME in the last 168 hours. Cardiac Enzymes: No results for input(s): CKTOTAL, CKMB, CKMBINDEX, TROPONINI in the last 168 hours. HbA1C: No results for  input(s): HGBA1C in the last 72 hours. CBG:  Recent Labs Lab 03/28/17 2234 03/28/17 2350 03/29/17 0808 03/29/17 1238  GLUCAP 78 77 67 150*    Recent Results (from the past 240 hour(s))  MRSA PCR Screening     Status: None   Collection Time: 03/29/17  2:40 AM  Result Value Ref Range Status   MRSA by PCR NEGATIVE NEGATIVE Final    Comment:        The GeneXpert MRSA Assay (FDA approved for NASAL specimens only), is one component of a comprehensive MRSA colonization surveillance program. It is not intended to diagnose MRSA infection nor to guide or monitor treatment for MRSA infections.          Radiology Studies: Dg Chest 2 View  Result Date: 03/28/2017 CLINICAL DATA:  46 year old male in need of dialysis. Evaluate for potential pulmonary edema. EXAM: CHEST  2 VIEW COMPARISON:  Chest x-ray 10/30/2016. FINDINGS: There is mild cephalization of the pulmonary vasculature and slight indistinctness of the interstitial markings suggestive of mild pulmonary edema. No pleural effusions. Mild cardiomegaly. Upper mediastinal contours are within normal limits. IMPRESSION: 1. Mild interstitial pulmonary edema. 2. Mild cardiomegaly. Electronically Signed   By: Vinnie Langton M.D.   On: 03/28/2017 19:31        Scheduled Meds: . amLODipine  10 mg Oral QHS  . calcium acetate  667 mg Oral TID WC  . [START ON 03/30/2017] cinacalcet  30 mg Oral Once per day on Mon Wed Fri  . cloNIDine  0.3 mg Oral BID  . doxazosin  4 mg Oral QHS  . furosemide  40 mg Oral Daily  . heparin  5,000 Units Subcutaneous Q8H  . insulin aspart  3 Units Intravenous Once  . irbesartan  300 mg Oral Daily  . metoprolol succinate  25 mg Oral QHS  . multivitamin  1 tablet Oral Daily  . pantoprazole  40 mg Oral Daily  . [START ON 03/30/2017] pneumococcal 23 valent vaccine  0.5 mL Intramuscular Tomorrow-1000  . sevelamer carbonate  1,600 mg Oral TID WC   Continuous Infusions:   LOS: 0 days      Barth Trella, MD, FACP, FHM. [facility] Pager [phone]  If 7PM-7AM, please contact night-coverage [email or web address] Password [email or web address] 03/29/2017, 3:57 PM

## 2017-03-29 NOTE — Procedures (Signed)
I have personally attended this patient's dialysis session.   Urgent HD for uremia (creatinine 27's, BUN 119) and hyperkalemia (6.3) with ostensibly no HD for a week. UF goal 4 liters He has no idea of EDW Access is L FA AVF  Jamal Maes, MD [facility] [phone] Pager 03/29/2017, 7:43 PM

## 2017-03-29 NOTE — Consult Note (Signed)
[facility] Renal Consultation Note    Indication for Consultation:  Management of ESRD/hemodialysis; anemia, hypertension/volume and secondary hyperparathyroidism PCP: No PCP on file  HPI: John Woodard is a 46 y.o. male with ESRD who has hemodialysis T,Th,S at [facility]. PMH of HTN, mental retardation, GERD, neurogenic bladder, GERD, SHPT, AOCD.   Patient presented to ED last PM with C/O of needing dialysis. Apparently patient has missed four dialysis treatments he says due to transportation issues. He says he had short treatment Thursday but did not get full treatment because he was late. Upon arrival to ED, K+ was found to be 6.3 Scr 27.64 BUN 119 Co2 17 HGB 8.8 PLT 116. CXR showed mild pulmonary edema. EKG showed AFib with peaked T-wave in V4. Patient received kayexelate, NAHCO3, D50W and insulin. Repeat K+ was 4.7 at 0034 03/29/17. Orders have been written for hemodialysis.   Patient is a very poor historian, gives rambling discourse. He is extremely HOH and one must yell or write. Sometimes he will answer appropriately, other times he will not. Patient says he has been having transportation issues and missing his dialysis treatments. He says he has been getting sick and needed some help. Brother brought him to ED. Cannot illicit from patient who helps him at home. Reviewed Care Everywhere attempting to find name of attending nephrologist, but no information found. Obtained nephrologist name Kriste Basque that primary obtained from patient's phone.   Currently he is walking in room with mild DOE and slight WOB. He says he is a little SOB. Cannot tell me anything else. Will expedite getting patient on hemodialysis.   Past Medical History:  Diagnosis Date  . Dialysis patient ([place])   . Dyspnea   . ESRD on dialysis ([place])   . Hypertension   . Renal disorder    Past Surgical History:  Procedure Laterality Date  . AVF Left   . VASCULAR SURGERY     Family History  Problem  Relation Age of Onset  . Hypertension Mother   . Diabetes Mellitus II Sister    Social History:  reports that he has never smoked. He has never used smokeless tobacco. He reports that he does not drink alcohol or use drugs. Allergies  Allergen Reactions  . Naproxen Other (See Comments)    "heart stopped one time"  . Lactose Intolerance (Gi) Diarrhea and Other (See Comments)    Blood in stools  . Penicillins Other (See Comments)    unknown   Prior to Admission medications   Medication Sig Start Date End Date Taking? Authorizing Provider  acetaminophen (TYLENOL) 500 MG tablet Take 500 mg by mouth every 6 (six) hours as needed for moderate pain.   Yes [provider]  amLODipine (NORVASC) 10 MG tablet Take 10 mg by mouth at bedtime.   Yes [provider]  cinacalcet (SENSIPAR) 30 MG tablet Take 30 mg by mouth every Monday, Wednesday, and Friday.    Yes [provider]  cloNIDine (CATAPRES) 0.3 MG tablet Take 0.3 mg by mouth 2 (two) times daily.   Yes [provider]  doxazosin (CARDURA) 4 MG tablet Take 4 mg by mouth at bedtime.   Yes [provider]  esomeprazole (NEXIUM) 40 MG capsule Take 40 mg by mouth daily before breakfast.   Yes [provider]  furosemide (LASIX) 40 MG tablet Take 40 mg by mouth daily.   Yes [provider]  hydrALAZINE (APRESOLINE) 50 MG tablet Take 75 mg by mouth 3 (  three) times daily.   Yes [provider]  losartan (COZAAR) 100 MG tablet Take 100 mg by mouth daily.   Yes [provider]  metoprolol succinate (TOPROL-XL) 25 MG 24 hr tablet Take 25 mg by mouth at bedtime.    Yes [provider]  multivitamin (RENA-VIT) TABS tablet Take 1 tablet by mouth daily.   Yes [provider]  sevelamer carbonate (RENVELA) 800 MG tablet Take 800-1,600 mg by mouth See admin instructions. 1600mg  three times daily with meals and 800mg  with one snack.   Yes [provider]    Current Facility-Administered Medications  Medication Dose Route Frequency Provider Last Rate Last Dose  . acetaminophen (TYLENOL) tablet 500 mg  500 mg Oral Q6H PRN Ivor Costa, MD      . amLODipine (NORVASC) tablet 10 mg  10 mg Oral QHS Ivor Costa, MD   10 mg at 03/29/17 0210  . calcium acetate (PHOSLO) capsule 667 mg  667 mg Oral TID WC Ivor Costa, MD   667 mg at 03/29/17 1303  . [START ON 03/30/2017] cinacalcet (SENSIPAR) tablet 30 mg  30 mg Oral Once per day on Mon Wed Fri Niu, Soledad Gerlach, MD      . cloNIDine (CATAPRES) tablet 0.3 mg  0.3 mg Oral BID Ivor Costa, MD   Stopped at 03/29/17 1051  . doxazosin (CARDURA) tablet 4 mg  4 mg Oral QHS Ivor Costa, MD   4 mg at 03/29/17 0211  . furosemide (LASIX) tablet 40 mg  40 mg Oral Daily Ivor Costa, MD   40 mg at 03/29/17 1045  . heparin injection 5,000 Units  5,000 Units Subcutaneous Q8H Ivor Costa, MD   5,000 Units at 03/29/17 1305  . hydrALAZINE (APRESOLINE) injection 5 mg  5 mg Intravenous Q2H PRN Ivor Costa, MD      . insulin aspart (novoLOG) injection 3 Units  3 Units Intravenous Once Ivor Costa, MD      . irbesartan (AVAPRO) tablet 300 mg  300 mg Oral Daily Ivor Costa, MD      . metoprolol succinate (TOPROL-XL) 24 hr tablet 25 mg  25 mg Oral QHS Ivor Costa, MD   25 mg at 03/29/17 0210  . multivitamin (RENA-VIT) tablet 1 tablet  1 tablet Oral Daily Ivor Costa, MD   1 tablet at 03/29/17 1044  . ondansetron (ZOFRAN) injection 4 mg  4 mg Intravenous Q8H PRN Ivor Costa, MD      . pantoprazole (PROTONIX) EC tablet 40 mg  40 mg Oral Daily Ivor Costa, MD   40 mg at 03/29/17 1045  . [START ON 03/30/2017] pneumococcal 23 valent vaccine (PNU-IMMUNE) injection 0.5 mL  0.5 mL Intramuscular Tomorrow-1000 Ivor Costa, MD      . sevelamer carbonate (RENVELA) tablet 1,600 mg  1,600 mg Oral TID WC Ivor Costa, MD   1,600 mg at 03/29/17 1304  . sevelamer carbonate (RENVELA) tablet 800 mg  800 mg Oral PRN Ivor Costa, MD      . zolpidem (AMBIEN) tablet 5 mg  5 mg Oral  QHS PRN Ivor Costa, MD       Labs: Basic Metabolic Panel:  Recent Labs Lab 03/28/17 1512 03/29/17 0035  NA 138 136  K 6.3* 4.7  CL 101 99*  CO2 17* 19*  GLUCOSE 87 67  BUN 119* 118*  CREATININE 27.64* 31.86*  CALCIUM 8.3* 8.0*   Liver Function Tests:  Recent Labs Lab 03/28/17 1512  AST 23  ALT 12*  ALKPHOS 55  BILITOT 1.2  PROT 7.0  ALBUMIN 3.9   No results for input(s): LIPASE, AMYLASE in the last 168 hours. No results for input(s): AMMONIA in the last 168 hours. CBC:  Recent Labs Lab 03/28/17 1512 03/29/17 0035  WBC 3.9* 4.4  HGB 8.8* 8.7*  HCT 27.9* 28.2*  MCV 84.8 85.2  PLT 116* 106*   Cardiac Enzymes: No results for input(s): CKTOTAL, CKMB, CKMBINDEX, TROPONINI in the last 168 hours. CBG:  Recent Labs Lab 03/28/17 2234 03/28/17 2350 03/29/17 0808 03/29/17 1238  GLUCAP 78 77 67 150*   Iron Studies:  Recent Labs  03/29/17 0035  IRON 57  TIBC 237*  FERRITIN 526*   Studies/Results: Dg Chest 2 View  Result Date: 03/28/2017 CLINICAL DATA:  46 year old male in need of dialysis. Evaluate for potential pulmonary edema. EXAM: CHEST  2 VIEW COMPARISON:  Chest x-ray 10/30/2016. FINDINGS: There is mild cephalization of the pulmonary vasculature and slight indistinctness of the interstitial markings suggestive of mild pulmonary edema. No pleural effusions. Mild cardiomegaly. Upper mediastinal contours are within normal limits. IMPRESSION: 1. Mild interstitial pulmonary edema. 2. Mild cardiomegaly. Electronically Signed   By: Vinnie Langton M.D.   On: 03/28/2017 19:31    ROS: As per HPI otherwise negative.  Physical Exam: Vitals:   03/28/17 2232 03/28/17 2351 03/29/17 0543 03/29/17 1015  BP: (!) 194/118 (!) 193/125 (!) 170/107 (!) 164/102  Pulse:  76 88 81  Resp:  18 19 19   Temp:  (!) 97.5 F (36.4 C) (!) 97.4 F (36.3 C) 98.1 F (36.7 C)  TempSrc:  Oral Oral Oral  SpO2:  99% 98% 97%  Weight:  88.7 kg (195 lb 8 oz)    Height:  6\' 2"  (1.88 m)        General: Well developed, well nourished, in no acute distress. Head: Normocephalic, atraumatic, sclera non-icteric, mucus membranes are moist Neck: Supple. Mild JVD @ 30 degrees.  Lungs: Bilateral breath sounds decreased in bases with few scattered bibasilar crackles. Slight WOB present.  Heart: Regularly irregular with S1 S2. No murmurs, rubs, or gallops appreciated. Abdomen: Soft, non-tender, non-distended with normoactive bowel sounds. No rebound/guarding. No obvious abdominal masses. M-S:  Strength and tone appear normal for age. Lower extremities: 1+ pitting edema BLE. 1+ L pedal edema trace R pedal edema.  Neuro: Alert and oriented X 2. Moves all extremities spontaneously. Psych:  Attempts to answer questions but easily distracted and starts on a different topic. Extremely HOH.   Dialysis Access: LFA AVF aneurysmal and torturous. + bruit.   Dialysis Orders: [facility] T,Th,S Will need to call tomorrow to get treatment orders and records  Assessment/Plan: 1.  Hyperkalemia/Uremia D/T missed HD. HD today. Recheck labs prior to HD 2.  ESRD -  T,Th,S. Having transportation issues. Needs MSW to help resolve this issue.  3.  Hypertension/volume  -On multiple antihypertensive meds.  Remains hypertensive, home meds have been resumed however some of these have not been started yet. Last BP 164/102. Attempt to lower volume in HD and hopefully bring down BP as well.   4.  Anemia  - HGB 8.7 Fe 57 Tsat 24%. Need record for recent ESA dosing. Give Fe load with HD today.  5.  Metabolic bone disease -  Ca 8.0 check renal function panel. Resume binders, sensipar.  6.  Nutrition - Albumin 3.9 Renal diet. Add renal vit. . 7. ??? New onset of AFIB-do not see this mentioned in records on care everywhere. Per primary.  Rita H. Owens Shark, NP-C 03/29/2017, 4:08 PM  [place] [phone]   Renal Attending:  Pt receives dialysis at [facility] followed by Dr. Joesph July and  has been unable to receive HD for the last 4 treatments due to lack of transportation per his report.  He presents uremic and hyperkalemic.  He has some obvious cognitive issues and will need a lot of support upon return to the outpt hemodialysis unit.  We will plan urgent HD today.  Nickolai Rinks C

## 2017-03-30 LAB — CBC
HEMATOCRIT: 28.8 % — AB (ref 39.0–52.0)
Hemoglobin: 9.4 g/dL — ABNORMAL LOW (ref 13.0–17.0)
MCH: 27 pg (ref 26.0–34.0)
MCHC: 32.6 g/dL (ref 30.0–36.0)
MCV: 82.8 fL (ref 78.0–100.0)
Platelets: 103 10*3/uL — ABNORMAL LOW (ref 150–400)
RBC: 3.48 MIL/uL — ABNORMAL LOW (ref 4.22–5.81)
RDW: 16.6 % — AB (ref 11.5–15.5)
WBC: 5 10*3/uL (ref 4.0–10.5)

## 2017-03-30 NOTE — Progress Notes (Addendum)
  [facility] Progress Note   Assessment/ Plan:   Assessment/Plan: 1.  Hyperkalemia/Uremia D/T missed HD. HD today again and then back on schedule tomorrow 2.  ESRD -  T,Th,S. Having transportation issues. Needs MSW to help resolve this issue.  3.  Hypertension/volume  -On multiple antihypertensive meds.  Remains hypertensive, home meds have been resumed.  Expect to improve with UF 4.  Anemia  - HGB 8.7 Fe 57 Tsat 24%. Need record for recent ESA dosing. Give Fe load with HD 5.  Metabolic bone disease -  Ca 8.0 check renal function panel. Resume binders, sensipar.  6.  Nutrition - Albumin 3.9 Renal diet. Add renal vit. . 7. Possible New onset of AFIB-do not see this mentioned in records on care everywhere. Per primary.   Subjective:    HD yesterday, tolerated well.  Chronically underdialyzed, will plan for HD again today   Objective:   BP (!) 160/90 (BP Location: Right Arm)   Pulse 79   Temp 97.9 F (36.6 C) (Oral)   Resp 18   Ht 6\' 2"  (1.88 m)   Wt 86.6 kg (191 lb)   SpO2 98%   BMI 24.52 kg/m   Physical Exam: Gen:NAD BFX:OVANVBTYOMA, + S3 Resp:normal WOb, muffled at bases Abd: mildly distended GU: + suprapubic catheter with minimal purulent material Ext:2+ pedal edema  Labs: BMET  Recent Labs Lab 03/28/17 1512 03/29/17 0035  NA 138 136  K 6.3* 4.7  CL 101 99*  CO2 17* 19*  GLUCOSE 87 67  BUN 119* 118*  CREATININE 27.64* 31.86*  CALCIUM 8.3* 8.0*   CBC  Recent Labs Lab 03/28/17 1512 03/29/17 0035 03/30/17 0221  WBC 3.9* 4.4 5.0  HGB 8.8* 8.7* 9.4*  HCT 27.9* 28.2* 28.8*  MCV 84.8 85.2 82.8  PLT 116* 106* 103*    @IMGRELPRIORS @ Medications:    . amLODipine  10 mg Oral QHS  . cinacalcet  30 mg Oral Once per day on Mon Wed Fri  . cloNIDine  0.3 mg Oral BID  . doxazosin  4 mg Oral QHS  . furosemide  40 mg Oral Daily  . heparin  5,000 Units Subcutaneous Q8H  . irbesartan  300 mg Oral Daily  . metoprolol succinate  25 mg Oral QHS   . multivitamin  1 tablet Oral Daily  . pantoprazole  40 mg Oral Daily  . pneumococcal 23 valent vaccine  0.5 mL Intramuscular Tomorrow-1000  . sevelamer carbonate  1,600 mg Oral TID WC     Madelon Lips, MD [facility] pgr [phone] 03/30/2017, 10:51 AM

## 2017-03-30 NOTE — Progress Notes (Signed)
PROGRESS NOTE   John Woodard  MHD:[id number]    DOB: [birth year]    DOA: 03/28/2017  PCP: Default, Provider, MD   I have briefly reviewed patients previous medical records in [facility].  Brief Narrative:  46 year old male with PMH of hard of hearing, ESRD on TTS HD (Dr. Kriste Basque, Nephrology), HTN, GERD, missed 4 sessions of dialysis due to transportation issues and presented to the ED on 9/8 feeling that he needed dialysis now, found to have potassium of 6.3, bicarbonate 17, BUN 119 and creatinine of 27.6. Hyperkalemia treated with medications and resolved. Nephrology consulted and undergoing serial dialysis to catch up with his regular schedule.   Assessment & Plan:   Principal Problem:   Hyperkalemia Active Problems:   Hypertension   ESRD on dialysis ([place])   Uremia   Pancytopenia ([facility])   GERD (gastroesophageal reflux disease)   1. Hyperkalemia: Due to missed dialysis. Initially treated with medications in ED. Now back on hemodialysis. Resolved. 2. ESRD on TTS HD: Nephrology follow-up appreciated. Underwent HD 9/9, again today and then will be back on his regular schedule TTS from tomorrow. 3. Essential hypertension: Uncontrolled. Volume overloaded and this to be managed across HD. Continue amlodipine, clonidine, doxazosin, metoprolol, Diovan and Lasix. Polypharmacy suggests difficult to control hypertension. 4. Pancytopenia: Stable. Leukopenia resolved. 5. GERD: PPI. 6. Atrial fibrillation: Noted on telemetry and admitting EKG. Not known if this is old or new for him. Continue monitoring on telemetry. Repeat EKG in am. Requested records from his outpatient physicians. Rate controlled. May not be a good candidate for long-term anticoagulation due to compliance issues. 7. Noncompliance: Social issues and unable to get to HD. Discussed with patient's brother who indicates that social worker at his outpatient dialysis center is trying to assist and he was also wondering if his  dialysis center could be changed to something closer to home. Nephrology to look into it. 8. ? Cognitive issues: As per discussion with brother, mental status is at baseline.   DVT prophylaxis: Subcutaneous heparin Code Status: Full Family Communication: Discussed in detail with patient's brother via phone. Updated care and answered questions. Disposition: DC home when medically improved   Consultants:  Nephrology   Procedures:  None  Antimicrobials:  None    Subjective: Patient was seen this morning prior to HD. He reported some swelling in his legs but improving. Denied dyspnea, chest pain or palpitations. Denies any other complaints.  ROS: Negative  Objective:  Vitals:   03/30/17 1530 03/30/17 1600 03/30/17 1630 03/30/17 1700  BP:  (!) 184/127 (!) 176/133 (!) 172/119  Pulse: 84 86 72 83  Resp: 12 12 13 17   Temp:      TempSrc:      SpO2:      Weight:      Height:      Temperature 97.83F, oxygen saturation 98%.  Examination:  General exam: Pleasant young male, moderately built and nourished, sitting up comfortably in bed. Respiratory system: Clear to auscultation. Respiratory effort normal. Cardiovascular system: S1 & S2 heard, RRR. No JVD, murmurs, rubs, gallops or clicks. No pedal edema. Telemetry: Atrial fibrillation with controlled ventricular rate. Gastrointestinal system: Abdomen is nondistended, soft and nontender. No organomegaly or masses felt. Normal bowel sounds heard. Central nervous system: Alert and seems oriented but difficult to understand him. No focal neurological deficits. Extremely hard of hearing. Extremities: Symmetric 5 x 5 power. Skin: No rashes, lesions or ulcers Psychiatry: Judgement and insight impaired. Mood & affect appropriate.  Data Reviewed: I have personally reviewed following labs and imaging studies  CBC:  Recent Labs Lab 03/28/17 1512 03/29/17 0035 03/30/17 0221  WBC 3.9* 4.4 5.0  HGB 8.8* 8.7* 9.4*  HCT 27.9*  28.2* 28.8*  MCV 84.8 85.2 82.8  PLT 116* 106* 378*   Basic Metabolic Panel:  Recent Labs Lab 03/28/17 1512 03/29/17 0035  NA 138 136  K 6.3* 4.7  CL 101 99*  CO2 17* 19*  GLUCOSE 87 67  BUN 119* 118*  CREATININE 27.64* 31.86*  CALCIUM 8.3* 8.0*   Liver Function Tests:  Recent Labs Lab 03/28/17 1512  AST 23  ALT 12*  ALKPHOS 55  BILITOT 1.2  PROT 7.0  ALBUMIN 3.9   Coagulation Profile: No results for input(s): INR, PROTIME in the last 168 hours. Cardiac Enzymes: No results for input(s): CKTOTAL, CKMB, CKMBINDEX, TROPONINI in the last 168 hours. HbA1C: No results for input(s): HGBA1C in the last 72 hours. CBG:  Recent Labs Lab 03/28/17 2234 03/28/17 2350 03/29/17 0808 03/29/17 1238 03/29/17 1719  GLUCAP 78 77 67 150* 85    Recent Results (from the past 240 hour(s))  MRSA PCR Screening     Status: None   Collection Time: 03/29/17  2:40 AM  Result Value Ref Range Status   MRSA by PCR NEGATIVE NEGATIVE Final    Comment:        The GeneXpert MRSA Assay (FDA approved for NASAL specimens only), is one component of a comprehensive MRSA colonization surveillance program. It is not intended to diagnose MRSA infection nor to guide or monitor treatment for MRSA infections.          Radiology Studies: Dg Chest 2 View  Result Date: 03/28/2017 CLINICAL DATA:  46 year old male in need of dialysis. Evaluate for potential pulmonary edema. EXAM: CHEST  2 VIEW COMPARISON:  Chest x-ray 10/30/2016. FINDINGS: There is mild cephalization of the pulmonary vasculature and slight indistinctness of the interstitial markings suggestive of mild pulmonary edema. No pleural effusions. Mild cardiomegaly. Upper mediastinal contours are within normal limits. IMPRESSION: 1. Mild interstitial pulmonary edema. 2. Mild cardiomegaly. Electronically Signed   By: Vinnie Langton M.D.   On: 03/28/2017 19:31        Scheduled Meds: . amLODipine  10 mg Oral QHS  . cinacalcet  30  mg Oral Once per day on Mon Wed Fri  . cloNIDine  0.3 mg Oral BID  . doxazosin  4 mg Oral QHS  . furosemide  40 mg Oral Daily  . heparin  5,000 Units Subcutaneous Q8H  . irbesartan  300 mg Oral Daily  . metoprolol succinate  25 mg Oral QHS  . multivitamin  1 tablet Oral Daily  . pantoprazole  40 mg Oral Daily  . pneumococcal 23 valent vaccine  0.5 mL Intramuscular Tomorrow-1000  . sevelamer carbonate  1,600 mg Oral TID WC   Continuous Infusions:   LOS: 1 day     Vanessia Bokhari, MD, FACP, FHM. [facility] Pager [phone]  If 7PM-7AM, please contact night-coverage [email or web address] Password [email or web address] 03/30/2017, 5:35 PM

## 2017-03-30 NOTE — Procedures (Signed)
Patient seen and examined on Hemodialysis. QB 350, UF goal 3.5L  Tolerating well so far.  Treatment adjusted as needed.  Madelon Lips MD [facility] pgr [phone]  3:05 PM

## 2017-03-30 NOTE — Progress Notes (Signed)
Dialysis treatment completed.  4000 mL ultrafiltrated and net fluid removal 3500 mL.    Patient status unchanged. Lung sounds diminished to ausculation in all fields. BLE 3+ edema. Cardiac: Afib.  Disconnected lines and removed needles.  Pressure held for 15 minutes and band aid/gauze dressing applied.  Report given to bedside RN, Alyse Low.

## 2017-03-31 LAB — CBC
HEMATOCRIT: 28.6 % — AB (ref 39.0–52.0)
HEMOGLOBIN: 9.2 g/dL — AB (ref 13.0–17.0)
MCH: 26.7 pg (ref 26.0–34.0)
MCHC: 32.2 g/dL (ref 30.0–36.0)
MCV: 83.1 fL (ref 78.0–100.0)
Platelets: 92 10*3/uL — ABNORMAL LOW (ref 150–400)
RBC: 3.44 MIL/uL — ABNORMAL LOW (ref 4.22–5.81)
RDW: 15.8 % — ABNORMAL HIGH (ref 11.5–15.5)
WBC: 3.3 10*3/uL — ABNORMAL LOW (ref 4.0–10.5)

## 2017-03-31 LAB — RENAL FUNCTION PANEL
ALBUMIN: 3.3 g/dL — AB (ref 3.5–5.0)
Anion gap: 13 (ref 5–15)
BUN: 50 mg/dL — AB (ref 6–20)
CO2: 24 mmol/L (ref 22–32)
Calcium: 8.1 mg/dL — ABNORMAL LOW (ref 8.9–10.3)
Chloride: 100 mmol/L — ABNORMAL LOW (ref 101–111)
Creatinine, Ser: 14.29 mg/dL — ABNORMAL HIGH (ref 0.61–1.24)
GFR calc Af Amer: 4 mL/min — ABNORMAL LOW (ref >60)
GFR calc non Af Amer: 4 mL/min — ABNORMAL LOW (ref >60)
GLUCOSE: 85 mg/dL (ref 65–99)
POTASSIUM: 4.1 mmol/L (ref 3.5–5.1)
Phosphorus: 6.1 mg/dL — ABNORMAL HIGH (ref 2.5–4.6)
Sodium: 137 mmol/L (ref 135–145)

## 2017-03-31 LAB — HEPATITIS B SURFACE ANTIGEN: Hepatitis B Surface Ag: NEGATIVE

## 2017-03-31 NOTE — Progress Notes (Signed)
Pt reports he is supposed to go to [facility] tomorrow to have his suprapubic catheter changed. Will report off to oncoming RN. Dorthey Sawyer, RN

## 2017-03-31 NOTE — Progress Notes (Signed)
PROGRESS NOTE   John Woodard  RNH:[id number]    DOB: [birth year]    DOA: 03/28/2017  PCP: Default, Provider, MD   I have briefly reviewed patients previous medical records in [facility].  Brief Narrative:  46 year old male with PMH of hard of hearing, ESRD on TTS HD (Dr. Kriste Basque, Nephrology), HTN, GERD, missed 4 sessions of dialysis due to transportation issues and presented to the ED on 9/8 feeling that he needed dialysis now, found to have potassium of 6.3, bicarbonate 17, BUN 119 and creatinine of 27.6. Hyperkalemia treated with medications and resolved. Nephrology consulted and undergoing serial dialysis to catch up with his regular schedule.   Assessment & Plan:   Principal Problem:   Hyperkalemia Active Problems:   Hypertension   ESRD on dialysis ([place])   Uremia   Pancytopenia (HCC)   GERD (gastroesophageal reflux disease)   1. Hyperkalemia: Due to missed dialysis. Initially treated with medications in ED. Now back on hemodialysis. Resolved. 2. ESRD on TTS HD: Nephrology follow-up appreciated. Underwent HD 9/9, 9/10, 9/11, he is back on his regular schedule TTS. Nephrology input appreciated, management per nephrology. Per nephrology patient will need fistulogram.  3. Essential hypertension: Uncontrolled. Volume overloaded and this to be managed across HD. Continue amlodipine, clonidine, doxazosin, metoprolol, Diovan and Lasix. Polypharmacy suggests difficult to control hypertension. Nephrology consulted 4. Pancytopenia: monitor, no sign of bleed, no fever 5. GERD: PPI. 6. Atrial fibrillation: Noted on telemetry and admitting EKG. Not known if this is old or new for him. Continue monitoring on telemetry. Repeat EKG in am. Requested records from his outpatient physicians. Rate controlled. May not be a good candidate for long-term anticoagulation due to compliance issues. Echo pending 7. Noncompliance: Social issues and unable to get to HD. Discussed with patient's brother who  indicates that social worker at his outpatient dialysis center is trying to assist and he was also wondering if his dialysis center could be changed to something closer to home. Nephrology to look into it. 8. ? Cognitive issues: As per discussion with brother, mental status is at baseline.   DVT prophylaxis: Subcutaneous heparin Code Status: Full Family Communication: pateint Disposition: not ready for discharge. social worker consulted, need nephrology clearance for discharge.   Consultants:  Nephrology   Procedures:  dialysis  Antimicrobials:  None    Subjective: Patient was seen after returned from HD. He is ambulating in room. Denied dyspnea, chest pain or palpitations. Denies any other complaints.  ROS: Negative  Objective:  Vitals:   03/31/17 0737 03/31/17 0743 03/31/17 0800 03/31/17 0830  BP: (!) 172/120 (!) 172/111 (!) 161/119 (!) 184/112  Pulse: 84 81 81 82  Resp: 12 12 13 12   Temp:      TempSrc:      SpO2:      Weight:      Height:      Temperature 97.21F, oxygen saturation 98%.  Examination:  General exam: Pleasant young male, moderately built and nourished, NAD Respiratory system: Clear to auscultation. Respiratory effort normal. Cardiovascular system: S1 & S2 heard, RRR. No JVD, murmurs, rubs, gallops or clicks. No pedal edema. Telemetry: Atrial fibrillation with controlled ventricular rate. Gastrointestinal system: Abdomen is nondistended, soft and nontender. No organomegaly or masses felt. Normal bowel sounds heard. Central nervous system: Alert and seems oriented but difficult to understand him. No focal neurological deficits. Extremely hard of hearing. Extremities: Symmetric 5 x 5 power. Skin: No rashes, lesions or ulcers Psychiatry: Judgement and insight impaired. Mood &  affect appropriate.     Data Reviewed: I have personally reviewed following labs and imaging studies  CBC:  Recent Labs Lab 03/28/17 1512 03/29/17 0035 03/30/17 0221  03/31/17 0753  WBC 3.9* 4.4 5.0 3.3*  HGB 8.8* 8.7* 9.4* 9.2*  HCT 27.9* 28.2* 28.8* 28.6*  MCV 84.8 85.2 82.8 83.1  PLT 116* 106* 103* 92*   Basic Metabolic Panel:  Recent Labs Lab 03/28/17 1512 03/29/17 0035 03/31/17 0752  NA 138 136 137  K 6.3* 4.7 4.1  CL 101 99* 100*  CO2 17* 19* 24  GLUCOSE 87 67 85  BUN 119* 118* 50*  CREATININE 27.64* 31.86* 14.29*  CALCIUM 8.3* 8.0* 8.1*  PHOS  --   --  6.1*   Liver Function Tests:  Recent Labs Lab 03/28/17 1512 03/31/17 0752  AST 23  --   ALT 12*  --   ALKPHOS 55  --   BILITOT 1.2  --   PROT 7.0  --   ALBUMIN 3.9 3.3*   Coagulation Profile: No results for input(s): INR, PROTIME in the last 168 hours. Cardiac Enzymes: No results for input(s): CKTOTAL, CKMB, CKMBINDEX, TROPONINI in the last 168 hours. HbA1C: No results for input(s): HGBA1C in the last 72 hours. CBG:  Recent Labs Lab 03/28/17 2234 03/28/17 2350 03/29/17 0808 03/29/17 1238 03/29/17 1719  GLUCAP 78 77 67 150* 85    Recent Results (from the past 240 hour(s))  MRSA PCR Screening     Status: None   Collection Time: 03/29/17  2:40 AM  Result Value Ref Range Status   MRSA by PCR NEGATIVE NEGATIVE Final    Comment:        The GeneXpert MRSA Assay (FDA approved for NASAL specimens only), is one component of a comprehensive MRSA colonization surveillance program. It is not intended to diagnose MRSA infection nor to guide or monitor treatment for MRSA infections.          Radiology Studies: No results found.      Scheduled Meds: . amLODipine  10 mg Oral QHS  . cinacalcet  30 mg Oral Once per day on Mon Wed Fri  . cloNIDine  0.3 mg Oral BID  . doxazosin  4 mg Oral QHS  . furosemide  40 mg Oral Daily  . heparin  5,000 Units Subcutaneous Q8H  . irbesartan  300 mg Oral Daily  . metoprolol succinate  25 mg Oral QHS  . multivitamin  1 tablet Oral Daily  . pantoprazole  40 mg Oral Daily  . pneumococcal 23 valent vaccine  0.5 mL  Intramuscular Tomorrow-1000  . sevelamer carbonate  1,600 mg Oral TID WC   Continuous Infusions:   LOS: 2 days     Jahna Liebert, MD PhD [facility] Pager [phone]  If 7PM-7AM, please contact night-coverage [email or web address] Password [email or web address] 03/31/2017, 9:27 AM

## 2017-03-31 NOTE — Progress Notes (Addendum)
  [facility] Progress Note   Assessment/ Plan:   Assessment/Plan: 1.  Hyperkalemia/Uremia D/T missed HD. HD today to get back on schedule.   2.  ESRD -  T,Th,S. Having transportation issues.  Have requested records from [facility].  Apparently he doesn't live close to his center per brother?  Have c/s SW to help dig into issue. 3.  Hypertension/volume  -On multiple antihypertensive meds.  Remains hypertensive, home meds have been resumed.  Expect to improve with UF 4.  Anemia  - HGB 9.4 Fe 57 Tsat 24%. Need record for recent ESA dosing. Give Fe load with HD 5.  Metabolic bone disease -  Ca 8.0. Binders, sensipar resumed. 6.  Nutrition - Albumin 3.9 Renal diet. Add renal vit. . 7. Possible New onset of AFIB-being worked up per primary. 8. Access issues- arterial pressures quite high, issue on Sunday as well.  Will c/s [facility] for fistulogram  Subjective:    Feeling well today.  Having access issues- arterial pressures high.     Objective:   BP (!) 153/119 (BP Location: Right Arm)   Pulse 77   Temp 98.4 F (36.9 C) (Oral)   Resp 17   Ht 6\' 2"  (1.88 m)   Wt 82.2 kg (181 lb 3.5 oz)   SpO2 100%   BMI 23.27 kg/m   Physical Exam: Gen:NAD OKH:[id number], + S3 Resp:normal WOB, muffled at bases Abd: mildly distended GU: + suprapubic catheter with minimal purulent material Ext:2+ pedal edema  Labs: BMET  Recent Labs Lab 03/28/17 1512 03/29/17 0035  NA 138 136  K 6.3* 4.7  CL 101 99*  CO2 17* 19*  GLUCOSE 87 67  BUN 119* 118*  CREATININE 27.64* 31.86*  CALCIUM 8.3* 8.0*   CBC  Recent Labs Lab 03/28/17 1512 03/29/17 0035 03/30/17 0221  WBC 3.9* 4.4 5.0  HGB 8.8* 8.7* 9.4*  HCT 27.9* 28.2* 28.8*  MCV 84.8 85.2 82.8  PLT 116* 106* 103*    @IMGRELPRIORS @ Medications:    . amLODipine  10 mg Oral QHS  . cinacalcet  30 mg Oral Once per day on Mon Wed Fri  . cloNIDine  0.3 mg Oral BID  . doxazosin  4 mg Oral QHS  . furosemide  40 mg Oral Daily   . heparin  5,000 Units Subcutaneous Q8H  . irbesartan  300 mg Oral Daily  . metoprolol succinate  25 mg Oral QHS  . multivitamin  1 tablet Oral Daily  . pantoprazole  40 mg Oral Daily  . pneumococcal 23 valent vaccine  0.5 mL Intramuscular Tomorrow-1000  . sevelamer carbonate  1,600 mg Oral TID WC     Madelon Lips, MD [facility] pgr [phone] 03/31/2017, 8:04 AM

## 2017-03-31 NOTE — Procedures (Signed)
Patient seen and examined on Hemodialysis. QB 200 UF goal 3L.  Needs fistulogram.  Will c/s VVS.  Treatment adjusted as needed.  Madelon Lips MD [facility] pgr [phone] 8:14 AM

## 2017-04-01 ENCOUNTER — Inpatient Hospital Stay (HOSPITAL_COMMUNITY): Payer: Medicare Other

## 2017-04-01 DIAGNOSIS — Z992 Dependence on renal dialysis: Secondary | ICD-10-CM

## 2017-04-01 DIAGNOSIS — T82898A Other specified complication of vascular prosthetic devices, implants and grafts, initial encounter: Secondary | ICD-10-CM

## 2017-04-01 DIAGNOSIS — I4891 Unspecified atrial fibrillation: Secondary | ICD-10-CM

## 2017-04-01 DIAGNOSIS — N186 End stage renal disease: Secondary | ICD-10-CM

## 2017-04-01 LAB — ECHOCARDIOGRAM COMPLETE
HEIGHTINCHES: 74 in
Weight: 2793.67 oz

## 2017-04-01 MED ORDER — METOPROLOL SUCCINATE ER 50 MG PO TB24
50.0000 mg | ORAL_TABLET | Freq: Every day | ORAL | Status: DC
Start: 2017-04-01 — End: 2017-04-08
  Administered 2017-04-01 – 2017-04-08 (×7): 50 mg via ORAL
  Filled 2017-04-01 (×7): qty 1

## 2017-04-01 NOTE — Progress Notes (Signed)
  John Woodard Progress Note   Assessment/ Plan:   Assessment/Plan: 1.  Hyperkalemia/Uremia D/T missed HD. Now back on HD schedule.  Will continue TTS.  2.  ESRD -  T,Th,S. Having transportation issues.  Have requested records from [facility].  Apparently he doesn't live close to his center per brother?  Have c/s SW to help dig into issue. 3.  Hypertension/volume  -On multiple antihypertensive meds.  Remains hypertensive, home meds have been resumed.  Expect to improve with UF 4.  Anemia  - HGB 9.4 Fe 57 Tsat 24%.  Give Fe load with HD 5.  Metabolic bone disease -  Ca 8.0. Binders, sensipar resumed. 6.  Nutrition - Albumin 3.9 Renal diet. Add renal vit. . 7. Possible New onset of AFIB-being worked up per primary. 8. Access issues- arterial pressures quite high, issue on Sunday as well.  Will c/s [facility]VS for fistulogram  Subjective:    Was able to complete dialysis yesterday although there were arterial pressure issues.   Objective:   BP (!) 159/115 (BP Location: Right Arm)   Pulse 78   Temp 97.8 F (36.6 C) (Oral)   Resp 19   Ht 6\' 2"  (1.88 m)   Wt 79.2 kg (174 lb 9.7 oz)   SpO2 100%   BMI 22.42 kg/m   Physical Exam: Gen:NAD MOQ:[id number], + S3 Resp:normal WOB, muffled at bases Abd: mildly distended GU: + suprapubic catheter with minimal purulent material Ext:2+ pedal edema ACCESS: tortuous L forearm fistula- bruit heard throughout.  ?noncompressible area near area of cannulation--> stenosis vs impending clot?  Labs: BMET  Recent Labs Lab 03/28/17 1512 03/29/17 0035 03/31/17 0752  NA 138 136 137  K 6.3* 4.7 4.1  CL 101 99* 100*  CO2 17* 19* 24  GLUCOSE 87 67 85  BUN 119* 118* 50*  CREATININE 27.64* 31.86* 14.29*  CALCIUM 8.3* 8.0* 8.1*  PHOS  --   --  6.1*   CBC  Recent Labs Lab 03/28/17 1512 03/29/17 0035 03/30/17 0221 03/31/17 0753  WBC 3.9* 4.4 5.0 3.3*  HGB 8.8* 8.7* 9.4* 9.2*  HCT 27.9* 28.2* 28.8* 28.6*  MCV 84.8 85.2 82.8 83.1   PLT 116* 106* 103* 92*    @IMGRELPRIORS @ Medications:    . amLODipine  10 mg Oral QHS  . cinacalcet  30 mg Oral Once per day on Mon Wed Fri  . cloNIDine  0.3 mg Oral BID  . doxazosin  4 mg Oral QHS  . furosemide  40 mg Oral Daily  . heparin  5,000 Units Subcutaneous Q8H  . irbesartan  300 mg Oral Daily  . metoprolol succinate  25 mg Oral QHS  . multivitamin  1 tablet Oral Daily  . pantoprazole  40 mg Oral Daily  . pneumococcal 23 valent vaccine  0.5 mL Intramuscular Tomorrow-1000  . sevelamer carbonate  1,600 mg Oral TID WC     Madelon Lips, MD [facility] pgr [phone] 04/01/2017, 9:43 AM

## 2017-04-01 NOTE — Consult Note (Signed)
[place] NOTE   MRN : [id number]  Reason for Consult: malfunctioning AV fistula  Requesting Physician:  Dr. Hollie Salk MRN #:  [id number]  History of Present Illness: This is a 46 y.o. male states he has had a working fistula left radial cephalic.  He states he has had a few problems in the past, but not very clear as to what the trouble was.  He has hemodialysis T,Th,S at [facility]. PMH of HTN, mental retardation, GERD, neurogenic bladder, GERD, SHPT, AOCD.   No other family in room at this time.    Current Facility-Administered Medications  Medication Dose Route Frequency Provider Last Rate Last Dose  . acetaminophen (TYLENOL) tablet 500 mg  500 mg Oral Q6H PRN Ivor Costa, MD   500 mg at 03/30/17 0620  . amLODipine (NORVASC) tablet 10 mg  10 mg Oral QHS Ivor Costa, MD   10 mg at 03/31/17 2335  . cinacalcet (SENSIPAR) tablet 30 mg  30 mg Oral Once per day on Mon Wed Fri Niu, Xilin, MD   30 mg at 04/01/17 0813  . cloNIDine (CATAPRES) tablet 0.3 mg  0.3 mg Oral BID Ivor Costa, MD   0.3 mg at 04/01/17 0813  . doxazosin (CARDURA) tablet 4 mg  4 mg Oral QHS Ivor Costa, MD   4 mg at 03/31/17 2335  . furosemide (LASIX) tablet 40 mg  40 mg Oral Daily Ivor Costa, MD   40 mg at 04/01/17 0813  . heparin injection 5,000 Units  5,000 Units Subcutaneous Q8H Ivor Costa, MD   5,000 Units at 03/31/17 2335  . hydrALAZINE (APRESOLINE) injection 5 mg  5 mg Intravenous Q2H PRN Ivor Costa, MD   5 mg at 03/29/17 2337  . irbesartan (AVAPRO) tablet 300 mg  300 mg Oral Daily Ivor Costa, MD   300 mg at 04/01/17 0814  . metoprolol succinate (TOPROL-XL) 24 hr tablet 25 mg  25 mg Oral QHS Ivor Costa, MD   25 mg at 03/31/17 2334  . multivitamin (RENA-VIT) tablet 1 tablet  1 tablet Oral Daily Ivor Costa, MD   1 tablet at 04/01/17 0813  . ondansetron (ZOFRAN) injection 4 mg  4 mg Intravenous Q8H PRN Ivor Costa, MD      . pantoprazole (PROTONIX) EC tablet  40 mg  40 mg Oral Daily Ivor Costa, MD   40 mg at 04/01/17 0814  . pneumococcal 23 valent vaccine (PNU-IMMUNE) injection 0.5 mL  0.5 mL Intramuscular Tomorrow-1000 Ivor Costa, MD      . sevelamer carbonate (RENVELA) tablet 1,600 mg  1,600 mg Oral TID WC Ivor Costa, MD   1,600 mg at 04/01/17 0814  . sevelamer carbonate (RENVELA) tablet 800 mg  800 mg Oral PRN Ivor Costa, MD      . zolpidem (AMBIEN) tablet 5 mg  5 mg Oral QHS PRN Ivor Costa, MD        Pt meds include: Statin :No Betablocker: Yes ASA: No Other anticoagulants/antiplatelets: none  Past Medical History:  Diagnosis Date  . Dialysis patient ([place])   . Dyspnea   . ESRD on dialysis ([place])   . Hypertension   . Renal disorder     Past Surgical History:  Procedure Laterality Date  . AVF Left   . VASCULAR SURGERY      Social History Social History  Substance Use Topics  . Smoking status: Never Smoker  . Smokeless tobacco: Never Used  . Alcohol  use No    Family History Family History  Problem Relation Age of Onset  . Hypertension Mother   . Diabetes Mellitus II Sister     Allergies  Allergen Reactions  . Naproxen Other (See Comments)    "heart stopped one time"  . Lactose Intolerance (Gi) Diarrhea and Other (See Comments)    Blood in stools  . Penicillins Other (See Comments)    unknown     REVIEW OF SYSTEMS  General: [ ]  Weight loss, [ ]  Fever, [ ]  chills Neurologic: [ ]  Dizziness, [ ]  Blackouts, [ ]  Seizure [ ]  Stroke, [ ]  "Mini stroke", [ ]  Slurred speech, [ ]  Temporary blindness; [ ]  weakness in arms or legs, [ ]  Hoarseness [ ]  Dysphagia Cardiac: [ ]  Chest pain/pressure, [ ]  Shortness of breath at rest [ ]  Shortness of breath with exertion, [ ]  Atrial fibrillation or irregular heartbeat  Vascular: [ ]  Pain in legs with walking, [ ]  Pain in legs at rest, [ ]  Pain in legs at night,  [ ]  Non-healing ulcer, [ ]  Blood clot in vein/DVT,   Pulmonary: [ ]  Home oxygen, [ ]  Productive cough, [ ]  Coughing up  blood, [ ]  Asthma,  [ ]  Wheezing [ ]  COPD Musculoskeletal:  [ ]  Arthritis, [ ]  Low back pain, [ ]  Joint pain Hematologic: [ ]  Easy Bruising, [ ]  Anemia; [ ]  Hepatitis Gastrointestinal: [ ]  Blood in stool, [ ]  Gastroesophageal Reflux/heartburn, Urinary: [x ] chronic Kidney disease, [x ] on HD - [ ]  MWF or [x ] TTHS, [ ]  Burning with urination, [ ]  Difficulty urinating Skin: [ ]  Rashes, [ ]  Wounds Psychological: [ ]  Anxiety, [ ]  Depression  Physical Examination Vitals:   03/31/17 1151 03/31/17 1715 03/31/17 2143 04/01/17 0535  BP: (!) 197/132 (!) 160/85 (!) 182/115 (!) 159/115  Pulse: 100 86 100 78  Resp: 18 18 20 19   Temp: 98 F (36.7 C) 98.5 F (36.9 C) (!) 97.4 F (36.3 C) 97.8 F (36.6 C)  TempSrc: Oral Oral Oral Oral  SpO2: 100% 100% 100% 100%  Weight: 174 lb 6.1 oz (79.1 kg)  174 lb 9.7 oz (79.2 kg)   Height:       Body mass index is 22.42 kg/m.  General:  WDWN in NAD Gait: Normal HENT: WNL Eyes: Pupils equal Pulmonary: normal non-labored breathing , without Rales, rhonchi,  wheezing Cardiac: RRR, without  Murmurs, rubs or gallops; No carotid bruits Abdomen: soft, NT Skin: no rashes, ulcers noted;  no Gangrene , no cellulitis; no open wounds;   Vascular Exam/Pulses:palpable radial and brachial pulses.  Left fistula with 2+ palpable distal thrill, weakened thrill proximal 1/2 near antecubital area.     Musculoskeletal: no muscle wasting or atrophy; no edema  Neurologic: A&O X 3; Appropriate Affect ;  SENSATION: normal; MOTOR FUNCTION: 5/5 Symmetric Speech is fluent/normal, somewhat disorganized   Significant Diagnostic Studies: CBC Lab Results  Component Value Date   WBC 3.3 (L) 03/31/2017   HGB 9.2 (L) 03/31/2017   HCT 28.6 (L) 03/31/2017   MCV 83.1 03/31/2017   PLT 92 (L) 03/31/2017    BMET    Component Value Date/Time   NA 137 03/31/2017 0752   K 4.1 03/31/2017 0752   CL 100 (L) 03/31/2017 0752   CO2 24 03/31/2017 0752   GLUCOSE 85 03/31/2017  0752   BUN 50 (H) 03/31/2017 0752   CREATININE 14.29 (H) 03/31/2017 0752   CALCIUM 8.1 (L) 03/31/2017 1700  GFRNONAA 4 (L) 03/31/2017 0752   GFRAA 4 (L) 03/31/2017 7096   Estimated Creatinine Clearance: 7.1 mL/min (A) (by C-G formula based on SCr of 14.29 mg/dL (H)).  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  Will likely need a fistula gram with possible intervention.  Will discuss scheduling with Dr. Donnetta Hutching.   Laurence Slate [facility] 04/01/2017 11:54 AM  I have examined the patient, reviewed and agree with above. The patient is having high arterial pressures with hemodialysis. He is having difficulty making it to dialysis transportation issues. He does dialyze on Tuesday Thursday and Saturday. On physical exam he does have a well-developed fistula throughout his forearm. This is very pulsatile throughout its course. The cephalic vein just above the antecubital space is clearly occluded by physical exam.  In reviewing his chart he had undergone a fistulogram in [facility] with interventional radiology in May 2017. 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 Discussed this with the patient. I recommended that he undergo repeat fistulogram and probable angioplasty of the outflow vein. We will schedule this for his next nondialysis day on Friday  Curt Jews, MD 04/01/2017 3:33 PM

## 2017-04-01 NOTE — Progress Notes (Signed)
  Echocardiogram 2D Echocardiogram has been performed.  John Woodard 04/01/2017, 12:43 PM

## 2017-04-01 NOTE — Progress Notes (Signed)
Patient ID: John Woodard, male   DOB: 03/26/1971, 46 y.o.   MRN: [id number]  PROGRESS NOTE    John Woodard  JOA:[id number] DOB: June 18, 1971 DOA: 03/28/2017 PCP: Default, Provider, MD   Brief Narrative: 46 year old male with PMH of hard of hearing, ESRD on TTS HD (Dr. Kriste Basque, Nephrology), HTN, GERD, missed 4 sessions of dialysis due to transportation issues and presented to the ED on 9/8 feeling that he needed dialysis now, found to have potassium of 6.3, bicarbonate 17, BUN 119 and creatinine of 27.6. Hyperkalemia treated with medications and resolved. Nephrology consulted and undergoing serial dialysis to catch up with his regular schedule.   Assessment & Plan:   Principal Problem:   Hyperkalemia Active Problems:   Hypertension   ESRD on dialysis ([place])   Uremia   Pancytopenia (HCC)   GERD (gastroesophageal reflux disease)   1. Hyperkalemia: Due to missed dialysis. Initially treated with medications in ED. Now back on hemodialysis. Resolved. Repeat a.m. labs 2. ESRD on HD: Nephrology follow-up appreciated. Continue hemodialysis as per nephrology scheduled and recommendations. Per nephrology patient will need fistulogram. Awaiting vascular surgery consultation 3. Essential hypertension: Uncontrolled. Continue amlodipine, clonidine, doxazosin, Irbesartan and Lasix. Increase the metoprolol to 50 mg daily. Follow nephrology recommendations  4. Pancytopenia: monitor, no sign of bleed, no fever. Repeat a.m. labs 5. GERD: PPI. 6. Atrial fibrillation: Noted on telemetry and admitting EKG. Not known if this is old or new for him. Continue monitoring on telemetry. Repeat EKG in am. Requested records from his outpatient physicians. Rate controlled. May not be a good candidate for long-term anticoagulation due to compliance issues. Echo pending 7. Noncompliance: Social issues and unable to get to HD. Follow up with social worker 8. ? Cognitive issues: As per discussion with brother by prior  hospitalist, mental status is at baseline.   DVT prophylaxis: Subcutaneous heparin Code Status:  Full Family Communication: None Disposition Plan: Pending improvement of clinical status and clearance from nephrology  Consultants: Nephrology and vascular surgery  Procedures: None  Antimicrobials: None    Subjective: Patient seen and examined at bedside. He is awake and denies any overnight fever, nausea or vomiting.   Objective: Vitals:   03/31/17 1715 03/31/17 2143 04/01/17 0535 04/01/17 1100  BP: (!) 160/85 (!) 182/115 (!) 159/115 (!) 164/109  Pulse: 86 100 78 78  Resp: 18 20 19 18   Temp: 98.5 F (36.9 C) (!) 97.4 F (36.3 C) 97.8 F (36.6 C) 97.8 F (36.6 C)  TempSrc: Oral Oral Oral Oral  SpO2: 100% 100% 100% 100%  Weight:  79.2 kg (174 lb 9.7 oz)    Height:        Intake/Output Summary (Last 24 hours) at 04/01/17 1415 Last data filed at 04/01/17 1100  Gross per 24 hour  Intake              480 ml  Output                0 ml  Net              480 ml   Filed Weights   03/31/17 0730 03/31/17 1151 03/31/17 2143  Weight: 82 kg (180 lb 12.4 oz) 79.1 kg (174 lb 6.1 oz) 79.2 kg (174 lb 9.7 oz)    Examination:  General exam: Appears calm and comfortable  Respiratory system: Bilateral decreased breath sound at bases Cardiovascular system: S1 & S2 heard, Rate controlled  Gastrointestinal system: Abdomen is nondistended, soft and nontender. Normal bowel  sounds heard. Suprapubic catheter present Extremities: No cyanosis, clubbing, edema    Data Reviewed: I have personally reviewed following labs and imaging studies  CBC:  Recent Labs Lab 03/28/17 1512 03/29/17 0035 03/30/17 0221 03/31/17 0753  WBC 3.9* 4.4 5.0 3.3*  HGB 8.8* 8.7* 9.4* 9.2*  HCT 27.9* 28.2* 28.8* 28.6*  MCV 84.8 85.2 82.8 83.1  PLT 116* 106* 103* 92*   Basic Metabolic Panel:  Recent Labs Lab 03/28/17 1512 03/29/17 0035 03/31/17 0752  NA 138 136 137  K 6.3* 4.7 4.1  CL 101 99* 100*   CO2 17* 19* 24  GLUCOSE 87 67 85  BUN 119* 118* 50*  CREATININE 27.64* 31.86* 14.29*  CALCIUM 8.3* 8.0* 8.1*  PHOS  --   --  6.1*   GFR: Estimated Creatinine Clearance: 7.1 mL/min (A) (by C-G formula based on SCr of 14.29 mg/dL (H)). Liver Function Tests:  Recent Labs Lab 03/28/17 1512 03/31/17 0752  AST 23  --   ALT 12*  --   ALKPHOS 55  --   BILITOT 1.2  --   PROT 7.0  --   ALBUMIN 3.9 3.3*   No results for input(s): LIPASE, AMYLASE in the last 168 hours. No results for input(s): AMMONIA in the last 168 hours. Coagulation Profile: No results for input(s): INR, PROTIME in the last 168 hours. Cardiac Enzymes: No results for input(s): CKTOTAL, CKMB, CKMBINDEX, TROPONINI in the last 168 hours. BNP (last 3 results) No results for input(s): PROBNP in the last 8760 hours. HbA1C: No results for input(s): HGBA1C in the last 72 hours. CBG:  Recent Labs Lab 03/28/17 2234 03/28/17 2350 03/29/17 0808 03/29/17 1238 03/29/17 1719  GLUCAP 78 77 67 150* 85   Lipid Profile: No results for input(s): CHOL, HDL, LDLCALC, TRIG, CHOLHDL, LDLDIRECT in the last 72 hours. Thyroid Function Tests: No results for input(s): TSH, T4TOTAL, FREET4, T3FREE, THYROIDAB in the last 72 hours. Anemia Panel: No results for input(s): VITAMINB12, FOLATE, FERRITIN, TIBC, IRON, RETICCTPCT in the last 72 hours. Sepsis Labs: No results for input(s): PROCALCITON, LATICACIDVEN in the last 168 hours.  Recent Results (from the past 240 hour(s))  MRSA PCR Screening     Status: None   Collection Time: 03/29/17  2:40 AM  Result Value Ref Range Status   MRSA by PCR NEGATIVE NEGATIVE Final    Comment:        The GeneXpert MRSA Assay (FDA approved for NASAL specimens only), is one component of a comprehensive MRSA colonization surveillance program. It is not intended to diagnose MRSA infection nor to guide or monitor treatment for MRSA infections.          Radiology Studies: No results  found.      Scheduled Meds: . amLODipine  10 mg Oral QHS  . cinacalcet  30 mg Oral Once per day on Mon Wed Fri  . cloNIDine  0.3 mg Oral BID  . doxazosin  4 mg Oral QHS  . furosemide  40 mg Oral Daily  . heparin  5,000 Units Subcutaneous Q8H  . irbesartan  300 mg Oral Daily  . metoprolol succinate  25 mg Oral QHS  . multivitamin  1 tablet Oral Daily  . pantoprazole  40 mg Oral Daily  . pneumococcal 23 valent vaccine  0.5 mL Intramuscular Tomorrow-1000  . sevelamer carbonate  1,600 mg Oral TID WC   Continuous Infusions:   LOS: 3 days        Aline August, MD [facility] Pager  [phone]  If 7PM-7AM, please contact night-coverage [email or web address] Password [email or web address] 04/01/2017, 2:15 PM

## 2017-04-02 LAB — BASIC METABOLIC PANEL
Anion gap: 13 (ref 5–15)
BUN: 61 mg/dL — AB (ref 6–20)
CALCIUM: 8.1 mg/dL — AB (ref 8.9–10.3)
CO2: 25 mmol/L (ref 22–32)
Chloride: 100 mmol/L — ABNORMAL LOW (ref 101–111)
Creatinine, Ser: 14 mg/dL — ABNORMAL HIGH (ref 0.61–1.24)
GFR calc Af Amer: 4 mL/min — ABNORMAL LOW (ref >60)
GFR, EST NON AFRICAN AMERICAN: 4 mL/min — AB (ref >60)
GLUCOSE: 79 mg/dL (ref 65–99)
Potassium: 3.9 mmol/L (ref 3.5–5.1)
Sodium: 138 mmol/L (ref 135–145)

## 2017-04-02 LAB — CBC WITH DIFFERENTIAL/PLATELET
BASOS ABS: 0 10*3/uL (ref 0.0–0.1)
Basophils Relative: 1 %
EOS PCT: 7 %
Eosinophils Absolute: 0.2 10*3/uL (ref 0.0–0.7)
HCT: 26.3 % — ABNORMAL LOW (ref 39.0–52.0)
Hemoglobin: 8.7 g/dL — ABNORMAL LOW (ref 13.0–17.0)
LYMPHS PCT: 43 %
Lymphs Abs: 1.3 10*3/uL (ref 0.7–4.0)
MCH: 27.4 pg (ref 26.0–34.0)
MCHC: 33.1 g/dL (ref 30.0–36.0)
MCV: 82.7 fL (ref 78.0–100.0)
MONO ABS: 0.3 10*3/uL (ref 0.1–1.0)
Monocytes Relative: 8 %
Neutro Abs: 1.3 10*3/uL — ABNORMAL LOW (ref 1.7–7.7)
Neutrophils Relative %: 41 %
PLATELETS: 87 10*3/uL — AB (ref 150–400)
RBC: 3.18 MIL/uL — ABNORMAL LOW (ref 4.22–5.81)
RDW: 15.6 % — AB (ref 11.5–15.5)
WBC: 3.1 10*3/uL — AB (ref 4.0–10.5)

## 2017-04-02 LAB — MAGNESIUM: Magnesium: 2.2 mg/dL (ref 1.7–2.4)

## 2017-04-02 MED ORDER — DARBEPOETIN ALFA 100 MCG/0.5ML IJ SOSY
100.0000 ug | PREFILLED_SYRINGE | INTRAMUSCULAR | Status: DC
  Administered 2017-04-04: 100 ug via INTRAVENOUS
  Filled 2017-04-02: qty 0.5

## 2017-04-02 MED ORDER — HYDRALAZINE HCL 20 MG/ML IJ SOLN
INTRAMUSCULAR | Status: AC
  Administered 2017-04-02: 5 mg via INTRAVENOUS
  Filled 2017-04-02: qty 1

## 2017-04-02 NOTE — Progress Notes (Signed)
  [facility] Progress Note   Assessment/ Plan:    1.  Hyperkalemia/Uremia D/T missed HD. Now back on HD schedule.  Will continue TTS.  2.  ESRD -  T,Th,S. Having transportation issues.  Have requested records from [facility].  SW consulted earlier this hospitalization, have asked hospitalist to ensure they see him today, appreciate assistance.   3.  Hypertension/volume  -On multiple antihypertensive meds.  Remains hypertensive, home meds have been resumed.  Expect to improve with UF 4.  Anemia  - HGB 9.4 Fe 57 Tsat 24%.  Only got 1 dose of Fe so far- have reordered. 5.  Metabolic bone disease -  Ca 8.0. Binders, sensipar resumed. 6.  Nutrition - Albumin 3.9 Renal diet. Add renal vit. . 7. Possible New onset of AFIB-being worked up per primary. 8. Access issues- arterial pressures quite Tuesday, issue on Sunday as well.  VVS c/s, appreciate.  Will   Subjective:    Was able to complete dialysis yesterday although there were arterial pressure issues.   Objective:   BP (!) 192/130   Pulse 90   Temp 97.7 F (36.5 C) (Oral)   Resp 18   Ht 6\' 2"  (1.88 m)   Wt 80.6 kg (177 lb 11.1 oz)   SpO2 100%   BMI 22.81 kg/m   Physical Exam: Gen:NAD YHC:[id number], + S3 Resp:normal WOB, muffled at bases Abd: mildly distended GU: + suprapubic catheter with minimal purulent material Ext:2+ pedal edema ACCESS: tortuous L forearm fistula- bruit heard throughout.  ?noncompressible area near area of cannulation--> stenosis vs impending clot?  Labs: BMET  Recent Labs Lab 03/28/17 1512 03/29/17 0035 03/31/17 0752 04/02/17 0327  NA 138 136 137 138  K 6.3* 4.7 4.1 3.9  CL 101 99* 100* 100*  CO2 17* 19* 24 25  GLUCOSE 87 67 85 79  BUN 119* 118* 50* 61*  CREATININE 27.64* 31.86* 14.29* 14.00*  CALCIUM 8.3* 8.0* 8.1* 8.1*  PHOS  --   --  6.1*  --    CBC  Recent Labs Lab 03/29/17 0035 03/30/17 0221 03/31/17 0753 04/02/17 0327  WBC 4.4 5.0 3.3* 3.1*  NEUTROABS  --    --   --  1.3*  HGB 8.7* 9.4* 9.2* 8.7*  HCT 28.2* 28.8* 28.6* 26.3*  MCV 85.2 82.8 83.1 82.7  PLT 106* 103* 92* 87*    @IMGRELPRIORS @ Medications:    . amLODipine  10 mg Oral QHS  . cinacalcet  30 mg Oral Once per day on Mon Wed Fri  . cloNIDine  0.3 mg Oral BID  . doxazosin  4 mg Oral QHS  . furosemide  40 mg Oral Daily  . heparin  5,000 Units Subcutaneous Q8H  . irbesartan  300 mg Oral Daily  . metoprolol succinate  50 mg Oral QHS  . multivitamin  1 tablet Oral Daily  . pantoprazole  40 mg Oral Daily  . pneumococcal 23 valent vaccine  0.5 mL Intramuscular Tomorrow-1000  . sevelamer carbonate  1,600 mg Oral TID WC     Madelon Lips, MD [facility] pgr [phone] 04/02/2017, 11:27 AM

## 2017-04-02 NOTE — Procedures (Signed)
Patient seen and examined on Hemodialysis. QB 400 UF goal 3L.  Fistulogram tomorrow.  Treatment adjusted as needed.  Madelon Lips MD [facility] pgr [phone] 11:31 AM

## 2017-04-02 NOTE — Progress Notes (Signed)
Patient ID: John Woodard, male   DOB: 03/27/1971, 46 y.o.   MRN: [id number]  PROGRESS NOTE    Ersel Enslin  WUJ:[id number] DOB: 1971/02/03 DOA: 03/28/2017 PCP: Default, Provider, MD   Brief Narrative:  46 year old male with PMH of hard of hearing, ESRD on TTS HD (Dr. Kriste Basque, Nephrology), HTN, GERD, missed 4 sessions of dialysis due to transportation issues and presented to the ED on 9/8 feeling that he needed dialysis now, found to have potassium of 6.3, bicarbonate 17, BUN 119 and creatinine of 27.6. Hyperkalemia treated with medications and resolved. Nephrology consulted and undergoing serial dialysis to catch up with his regular schedule.  Assessment & Plan:   Principal Problem:   Hyperkalemia Active Problems:   Hypertension   ESRD on dialysis ([place])   Uremia   Pancytopenia (HCC)   GERD (gastroesophageal reflux disease)   1. Hyperkalemia: Due to missed dialysis. Initially treated with medications in ED. Now back on hemodialysis. Resolved. Repeat a.m. labs 2. ESRD on NF:[id number] follow-up appreciated. Continue hemodialysis as per nephrology schedule and recommendations. Vascular surgery consultation appreciated. Patient probably will have fistulogram tomorrow 3. Essential hypertension: Uncontrolled. Continue amlodipine, clonidine, doxazosin, Irbesartan and Lasix and metoprolol. Add hydralazine. Follow nephrology recommendations  4. Pancytopenia: monitor, no sign of bleed, no fever. Repeat a.m. labs 5. GERD:PPI. 6. Atrial fibrillation: Noted on telemetry and admitting EKG. Not known if this is old or new for him. Continue monitoring on telemetry. Currently rate controlled.  Requested records from his outpatient physicians. May not be a good candidate for long-term anticoagulation due to compliance issues. Echo shows ejection fraction of 65-70% with grade 2 diastolic dysfunction 7. Noncompliance: Social issues and unable to get to HD. Follow up with social worker 8. ? Cognitive  issues: As per discussion with brother by prior hospitalist, mental status is at baseline.   DVT prophylaxis: Subcutaneous heparin Code Status:  Full Family Communication: None Disposition Plan: Pending improvement of clinical status and clearance from nephrology  Consultants: Nephrology and vascular surgery  Procedures:  echo on 04/01/2017: Study Conclusions  - Left ventricle: The cavity size was normal. There was severe   concentric hypertrophy. Systolic function was vigorous. The   estimated ejection fraction was in the range of 65% to 70%. Wall   motion was normal; there were no regional wall motion   abnormalities. Features are consistent with a pseudonormal left   ventricular filling pattern, with concomitant abnormal relaxation   and increased filling pressure (grade 2 diastolic dysfunction). - Aortic valve: There was mild regurgitation. - Mitral valve: There was mild regurgitation. - Left atrium: The atrium was severely dilated. - Right atrium: The atrium was severely dilated. - Tricuspid valve: There was moderate regurgitation.  Antimicrobials: None    Subjective: Patient seen and examined at bedside. He is awake but hard of hearing. He denies any overnight fever, nausea or vomiting.  Objective: Vitals:   04/02/17 1000 04/02/17 1030 04/02/17 1100 04/02/17 1139  BP: (!) 190/123 (!) 180/115 (!) 192/130 (!) 178/126  Pulse: 90 93 90 87  Resp:    18  Temp:    98.1 F (36.7 C)  TempSrc:    Oral  SpO2:    100%  Weight:    77 kg (169 lb 12.1 oz)  Height:        Intake/Output Summary (Last 24 hours) at 04/02/17 1212 Last data filed at 04/02/17 1139  Gross per 24 hour  Intake  0 ml  Output             3000 ml  Net            -3000 ml   Filed Weights   04/01/17 2056 04/02/17 0715 04/02/17 1139  Weight: 80 kg (176 lb 5.9 oz) 80.6 kg (177 lb 11.1 oz) 77 kg (169 lb 12.1 oz)    Examination:  General exam: Appears calm and comfortable    Respiratory system: Bilateral decreased breath sound at bases Cardiovascular system: S1 & S2 heard, Rate controlled  Gastrointestinal system: Abdomen is nondistended, soft and nontender. Normal bowel sounds heard. Suprapubic catheter present Extremities: No cyanosis, clubbing, edema    Data Reviewed: I have personally reviewed following labs and imaging studies  CBC:  Recent Labs Lab 03/28/17 1512 03/29/17 0035 03/30/17 0221 03/31/17 0753 04/02/17 0327  WBC 3.9* 4.4 5.0 3.3* 3.1*  NEUTROABS  --   --   --   --  1.3*  HGB 8.8* 8.7* 9.4* 9.2* 8.7*  HCT 27.9* 28.2* 28.8* 28.6* 26.3*  MCV 84.8 85.2 82.8 83.1 82.7  PLT 116* 106* 103* 92* 87*   Basic Metabolic Panel:  Recent Labs Lab 03/28/17 1512 03/29/17 0035 03/31/17 0752 04/02/17 0327  NA 138 136 137 138  K 6.3* 4.7 4.1 3.9  CL 101 99* 100* 100*  CO2 17* 19* 24 25  GLUCOSE 87 67 85 79  BUN 119* 118* 50* 61*  CREATININE 27.64* 31.86* 14.29* 14.00*  CALCIUM 8.3* 8.0* 8.1* 8.1*  MG  --   --   --  2.2  PHOS  --   --  6.1*  --    GFR: Estimated Creatinine Clearance: 7 mL/min (A) (by C-G formula based on SCr of 14 mg/dL (H)). Liver Function Tests:  Recent Labs Lab 03/28/17 1512 03/31/17 0752  AST 23  --   ALT 12*  --   ALKPHOS 55  --   BILITOT 1.2  --   PROT 7.0  --   ALBUMIN 3.9 3.3*   No results for input(s): LIPASE, AMYLASE in the last 168 hours. No results for input(s): AMMONIA in the last 168 hours. Coagulation Profile: No results for input(s): INR, PROTIME in the last 168 hours. Cardiac Enzymes: No results for input(s): CKTOTAL, CKMB, CKMBINDEX, TROPONINI in the last 168 hours. BNP (last 3 results) No results for input(s): PROBNP in the last 8760 hours. HbA1C: No results for input(s): HGBA1C in the last 72 hours. CBG:  Recent Labs Lab 03/28/17 2234 03/28/17 2350 03/29/17 0808 03/29/17 1238 03/29/17 1719  GLUCAP 78 77 67 150* 85   Lipid Profile: No results for input(s): CHOL, HDL, LDLCALC,  TRIG, CHOLHDL, LDLDIRECT in the last 72 hours. Thyroid Function Tests: No results for input(s): TSH, T4TOTAL, FREET4, T3FREE, THYROIDAB in the last 72 hours. Anemia Panel: No results for input(s): VITAMINB12, FOLATE, FERRITIN, TIBC, IRON, RETICCTPCT in the last 72 hours. Sepsis Labs: No results for input(s): PROCALCITON, LATICACIDVEN in the last 168 hours.  Recent Results (from the past 240 hour(s))  MRSA PCR Screening     Status: None   Collection Time: 03/29/17  2:40 AM  Result Value Ref Range Status   MRSA by PCR NEGATIVE NEGATIVE Final    Comment:        The GeneXpert MRSA Assay (FDA approved for NASAL specimens only), is one component of a comprehensive MRSA colonization surveillance program. It is not intended to diagnose MRSA infection nor to guide or monitor treatment for  MRSA infections.          Radiology Studies: No results found.      Scheduled Meds: . amLODipine  10 mg Oral QHS  . cinacalcet  30 mg Oral Once per day on Mon Wed Fri  . cloNIDine  0.3 mg Oral BID  . [START ON 04/04/2017] darbepoetin (ARANESP) injection - DIALYSIS  100 mcg Intravenous Q Sat-HD  . doxazosin  4 mg Oral QHS  . furosemide  40 mg Oral Daily  . heparin  5,000 Units Subcutaneous Q8H  . irbesartan  300 mg Oral Daily  . metoprolol succinate  50 mg Oral QHS  . multivitamin  1 tablet Oral Daily  . pantoprazole  40 mg Oral Daily  . pneumococcal 23 valent vaccine  0.5 mL Intramuscular Tomorrow-1000  . sevelamer carbonate  1,600 mg Oral TID WC   Continuous Infusions:   LOS: 4 days        Aline August, MD [facility] Pager [phone]  If 7PM-7AM, please contact night-coverage [email or web address] Password [email or web address] 04/02/2017, 12:12 PM

## 2017-04-03 ENCOUNTER — Encounter (HOSPITAL_COMMUNITY)
Admission: EM | Disposition: A | Discharge status: Home / Self Care | Payer: Self-pay | Source: Home / Self Care | Attending: Internal Medicine

## 2017-04-03 ENCOUNTER — Encounter (HOSPITAL_COMMUNITY): Payer: Self-pay | Admitting: Vascular Surgery

## 2017-04-03 HISTORY — PX: PERIPHERAL VASCULAR BALLOON ANGIOPLASTY: CATH118281

## 2017-04-03 HISTORY — PX: A/V SHUNTOGRAM: CATH118297

## 2017-04-03 LAB — CBC
HEMATOCRIT: 30.6 % — AB (ref 39.0–52.0)
HEMOGLOBIN: 9.7 g/dL — AB (ref 13.0–17.0)
MCH: 26.9 pg (ref 26.0–34.0)
MCHC: 31.7 g/dL (ref 30.0–36.0)
MCV: 84.8 fL (ref 78.0–100.0)
Platelets: 98 10*3/uL — ABNORMAL LOW (ref 150–400)
RBC: 3.61 MIL/uL — AB (ref 4.22–5.81)
RDW: 15.4 % (ref 11.5–15.5)
WBC: 3.6 10*3/uL — ABNORMAL LOW (ref 4.0–10.5)

## 2017-04-03 LAB — BASIC METABOLIC PANEL
ANION GAP: 11 (ref 5–15)
BUN: 36 mg/dL — ABNORMAL HIGH (ref 6–20)
CALCIUM: 8.9 mg/dL (ref 8.9–10.3)
CO2: 26 mmol/L (ref 22–32)
Chloride: 100 mmol/L — ABNORMAL LOW (ref 101–111)
Creatinine, Ser: 10.05 mg/dL — ABNORMAL HIGH (ref 0.61–1.24)
GFR calc Af Amer: 6 mL/min — ABNORMAL LOW (ref >60)
GFR, EST NON AFRICAN AMERICAN: 5 mL/min — AB (ref >60)
GLUCOSE: 84 mg/dL (ref 65–99)
POTASSIUM: 4.5 mmol/L (ref 3.5–5.1)
Sodium: 137 mmol/L (ref 135–145)

## 2017-04-03 LAB — PROTIME-INR
INR: 0.98
PROTHROMBIN TIME: 12.9 s (ref 11.4–15.2)

## 2017-04-03 SURGERY — A/V SHUNTOGRAM
Anesthesia: LOCAL | Laterality: Left

## 2017-04-03 MED ORDER — HYDRALAZINE HCL 20 MG/ML IJ SOLN
INTRAMUSCULAR | Status: DC | PRN
  Administered 2017-04-03 (×2): 10 mg via INTRAVENOUS

## 2017-04-03 MED ORDER — FENTANYL CITRATE (PF) 100 MCG/2ML IJ SOLN
INTRAMUSCULAR | Status: AC
  Filled 2017-04-03: qty 2

## 2017-04-03 MED ORDER — HEPARIN SODIUM (PORCINE) 1000 UNIT/ML IJ SOLN
INTRAMUSCULAR | Status: DC | PRN
  Administered 2017-04-03: 3000 [IU] via INTRAVENOUS

## 2017-04-03 MED ORDER — HYDRALAZINE HCL 20 MG/ML IJ SOLN: 5.0000 mg | INTRAMUSCULAR | Status: DC | PRN

## 2017-04-03 MED ORDER — SODIUM CHLORIDE 0.9 % IV SOLN: 250.0000 mL | INTRAVENOUS | Status: DC | PRN

## 2017-04-03 MED ORDER — HYDRALAZINE HCL 50 MG PO TABS
75.0000 mg | ORAL_TABLET | Freq: Three times a day (TID) | ORAL | Status: DC
  Administered 2017-04-03: 100 mg via ORAL
  Administered 2017-04-03: 75 mg via ORAL
  Filled 2017-04-03: qty 1

## 2017-04-03 MED ORDER — LIDOCAINE HCL (PF) 1 % IJ SOLN
INTRAMUSCULAR | Status: AC
  Filled 2017-04-03: qty 30

## 2017-04-03 MED ORDER — HYDRALAZINE HCL 50 MG PO TABS
100.0000 mg | ORAL_TABLET | Freq: Three times a day (TID) | ORAL | Status: DC
  Administered 2017-04-03 – 2017-04-08 (×13): 100 mg via ORAL
  Filled 2017-04-03 (×14): qty 2

## 2017-04-03 MED ORDER — LABETALOL HCL 5 MG/ML IV SOLN: 10.0000 mg | INTRAVENOUS | Status: DC | PRN

## 2017-04-03 MED ORDER — SODIUM CHLORIDE 0.9% FLUSH
3.0000 mL | Freq: Two times a day (BID) | INTRAVENOUS | Status: DC
  Administered 2017-04-03 – 2017-04-08 (×10): 3 mL via INTRAVENOUS

## 2017-04-03 MED ORDER — HEPARIN SODIUM (PORCINE) 1000 UNIT/ML IJ SOLN
INTRAMUSCULAR | Status: AC
  Filled 2017-04-03: qty 1

## 2017-04-03 MED ORDER — LIDOCAINE HCL (PF) 1 % IJ SOLN
INTRAMUSCULAR | Status: DC | PRN
  Administered 2017-04-03: 5 mL

## 2017-04-03 MED ORDER — HYDRALAZINE HCL 20 MG/ML IJ SOLN
INTRAMUSCULAR | Status: AC
  Filled 2017-04-03: qty 1

## 2017-04-03 MED ORDER — HEPARIN (PORCINE) IN NACL 2-0.9 UNIT/ML-% IJ SOLN
INTRAMUSCULAR | Status: AC
  Filled 2017-04-03: qty 500

## 2017-04-03 MED ORDER — FENTANYL CITRATE (PF) 100 MCG/2ML IJ SOLN
INTRAMUSCULAR | Status: DC | PRN
  Administered 2017-04-03: 50 ug via INTRAVENOUS

## 2017-04-03 MED ORDER — SODIUM CHLORIDE 0.9% FLUSH: 3.0000 mL | INTRAVENOUS | Status: DC | PRN

## 2017-04-03 SURGICAL SUPPLY — 22 items
BAG SNAP BAND KOVER 36X36 (MISCELLANEOUS) ×2 IMPLANT
BALLN ARMADA 6X60X80 (BALLOONS) ×2
BALLN MUSTANG 7X80X75 (BALLOONS) ×2
BALLN MUSTANG 8.0X40 75 (BALLOONS) ×2
BALLOON ARMADA 6X60X80 (BALLOONS) ×1 IMPLANT
BALLOON MUSTANG 7X80X75 (BALLOONS) ×1 IMPLANT
BALLOON MUSTANG 8.0X40 75 (BALLOONS) ×1 IMPLANT
CATH BEACON 5 .035 65 KMP TIP (CATHETERS) ×2 IMPLANT
COVER DOME SNAP 22 D (MISCELLANEOUS) ×2 IMPLANT
COVER PRB 48X5XTLSCP FOLD TPE (BAG) ×1 IMPLANT
COVER PROBE 5X48 (BAG) ×2
GUIDEWIRE ANGLED .035X150CM (WIRE) ×2 IMPLANT
KIT ENCORE 26 ADVANTAGE (KITS) ×2 IMPLANT
KIT MICROINTRODUCER STIFF 5F (SHEATH) ×2 IMPLANT
PROTECTION STATION PRESSURIZED (MISCELLANEOUS) ×2
SHEATH PINNACLE R/O II 6F 4CM (SHEATH) ×2 IMPLANT
STATION PROTECTION PRESSURIZED (MISCELLANEOUS) ×1 IMPLANT
STOPCOCK MORSE 400PSI 3WAY (MISCELLANEOUS) ×2 IMPLANT
SUT MNCRL AB 4-0 PS2 18 (SUTURE) ×2 IMPLANT
TRAY PV CATH (CUSTOM PROCEDURE TRAY) ×2 IMPLANT
TUBING CIL FLEX 10 FLL-RA (TUBING) ×2 IMPLANT
WIRE BENTSON .035X145CM (WIRE) ×2 IMPLANT

## 2017-04-03 NOTE — Interval H&P Note (Signed)
History and Physical Interval Note:  04/03/2017 9:51 AM  John Woodard  has presented today for surgery, with the diagnosis of poor flow in fistula  The various methods of treatment have been discussed with the patient and family. After consideration of risks, benefits and other options for treatment, the patient has consented to  Procedure(s): A/V Shuntogram (Left) as a surgical intervention .  The patient's history has been reviewed, patient examined, no change in status, stable for surgery.  I have reviewed the patient's chart and labs.  Questions were answered to the patient's satisfaction.     Ruta Hinds

## 2017-04-03 NOTE — Plan of Care (Signed)
Problem: Health Behavior/Discharge Planning: Goal: Ability to manage health-related needs will improve Outcome: Progressing Pt asking about setting up transportation so that he will be able to make it with dialysis. Will discuss with social work.

## 2017-04-03 NOTE — Progress Notes (Signed)
Patient ID: John Woodard, male   DOB: Aug 19, 1970, 46 y.o.   MRN: [id number]  PROGRESS NOTE    John Woodard  WUJ:[id number] DOB: Nov 22, 1970 DOA: 03/28/2017 PCP: Default, Provider, MD   Brief Narrative:  46 year old male with PMH of hard of hearing, ESRD on TTS HD (Dr. Kriste Basque, Nephrology), HTN, GERD, missed 4 sessions of dialysis due to transportation issues and presented to the ED on 9/8 feeling that he needed dialysis now, found to have potassium of 6.3, bicarbonate 17, BUN 119 and creatinine of 27.6. Hyperkalemia treated with medications and resolved. Nephrology consulted and undergoing serial dialysis. Vascular surgery also consulted.  Assessment & Plan:   Principal Problem:   Hyperkalemia Active Problems:   Hypertension   ESRD on dialysis ([place])   Uremia   Pancytopenia (HCC)   GERD (gastroesophageal reflux disease)   1. Hyperkalemia: Due to missed dialysis. Initially treated with medications in ED. Now back on hemodialysis. Resolved. Repeat a.m. labs 2. ESRD on NF:[id number] follow-up appreciated. Continue hemodialysis as per nephrology schedule and recommendations. Vascular surgery consultation appreciated. Patient planned for fistulogram today. 3. Essential hypertension: Uncontrolled. Continue amlodipine, clonidine, doxazosin, Irbesartan and Lasix and metoprolol. Add hydralazine. Follow nephrology recommendations  4. Pancytopenia: monitor, no sign of bleed, no fever. Repeat a.m. labs 5. GERD:PPI. 6. Paroxysmal Atrial fibrillation: Noted on telemetry and admitting EKG. Not known if this is old or new for him. Continue monitoring on telemetry. Currently rate controlled. May not be a good candidate for long-term anticoagulation due to compliance issues. Echo shows ejection fraction of 65-70% with grade 2 diastolic dysfunction. Outpatient follow-up with primary care provider and/or cardiology regarding a need for anticoagulation 7. Noncompliance: Social issues and unable to get to HD.  Follow up with Education officer, museum. 8. ? Cognitive issues: As per discussion with brother by prior hospitalist, mental status is at baseline. 9. History of suprapubic catheter: Spoke to on call urologist yesterday on phone Dr. Alyson Ingles who would arrange for outpatient suprapubic catheter change next week.   DVT prophylaxis: Subcutaneous heparin Code Status:  Full Family Communication: None Disposition Plan: Pending improvement of clinical status and clearance from nephrology  Consultants: Nephrology and vascular surgery  Procedures:  echo on 04/01/2017: Study Conclusions  - Left ventricle: The cavity size was normal. There was severe   concentric hypertrophy. Systolic function was vigorous. The   estimated ejection fraction was in the range of 65% to 70%. Wall   motion was normal; there were no regional wall motion   abnormalities. Features are consistent with a pseudonormal left   ventricular filling pattern, with concomitant abnormal relaxation   and increased filling pressure (grade 2 diastolic dysfunction). - Aortic valve: There was mild regurgitation. - Mitral valve: There was mild regurgitation. - Left atrium: The atrium was severely dilated. - Right atrium: The atrium was severely dilated. - Tricuspid valve: There was moderate regurgitation.  Antimicrobials: None    Subjective: Patient seen and examined at bedside. He is awake but hard of hearing. No overnight fever, nausea or vomiting.  Objective: Vitals:   04/02/17 2123 04/03/17 0542 04/03/17 0800 04/03/17 0847  BP: (!) 144/103 (!) 167/112 (!) 190/134 (!) 193/139  Pulse: 75 75 81 (!) 101  Resp:  18  18  Temp:  98.2 F (36.8 C) 100 F (37.8 C) 98.1 F (36.7 C)  TempSrc:  Oral Oral Oral  SpO2:  100% 100% 100%  Weight:      Height:        Intake/Output Summary (Last  24 hours) at 04/03/17 1000 Last data filed at 04/02/17 2037  Gross per 24 hour  Intake              240 ml  Output             3000 ml  Net             -2760 ml   Filed Weights   04/02/17 0715 04/02/17 1139 04/02/17 2036  Weight: 80.6 kg (177 lb 11.1 oz) 77 kg (169 lb 12.1 oz) 77 kg (169 lb 12.1 oz)    Examination:  General exam: Appears calm and comfortable  Respiratory system: Bilateral decreased breath sound at bases Cardiovascular system: S1 & S2 heard, Rate controlled  Gastrointestinal system: Abdomen is nondistended, soft and nontender. Normal bowel sounds heard. Suprapubic catheter present Extremities: No cyanosis, clubbing, edema    Data Reviewed: I have personally reviewed following labs and imaging studies  CBC:  Recent Labs Lab 03/29/17 0035 03/30/17 0221 03/31/17 0753 04/02/17 0327 04/03/17 0334  WBC 4.4 5.0 3.3* 3.1* 3.6*  NEUTROABS  --   --   --  1.3*  --   HGB 8.7* 9.4* 9.2* 8.7* 9.7*  HCT 28.2* 28.8* 28.6* 26.3* 30.6*  MCV 85.2 82.8 83.1 82.7 84.8  PLT 106* 103* 92* 87* 98*   Basic Metabolic Panel:  Recent Labs Lab 03/28/17 1512 03/29/17 0035 03/31/17 0752 04/02/17 0327 04/03/17 0334  NA 138 136 137 138 137  K 6.3* 4.7 4.1 3.9 4.5  CL 101 99* 100* 100* 100*  CO2 17* 19* 24 25 26   GLUCOSE 87 67 85 79 84  BUN 119* 118* 50* 61* 36*  CREATININE 27.64* 31.86* 14.29* 14.00* 10.05*  CALCIUM 8.3* 8.0* 8.1* 8.1* 8.9  MG  --   --   --  2.2  --   PHOS  --   --  6.1*  --   --    GFR: Estimated Creatinine Clearance: 9.8 mL/min (A) (by C-G formula based on SCr of 10.05 mg/dL (H)). Liver Function Tests:  Recent Labs Lab 03/28/17 1512 03/31/17 0752  AST 23  --   ALT 12*  --   ALKPHOS 55  --   BILITOT 1.2  --   PROT 7.0  --   ALBUMIN 3.9 3.3*   No results for input(s): LIPASE, AMYLASE in the last 168 hours. No results for input(s): AMMONIA in the last 168 hours. Coagulation Profile:  Recent Labs Lab 04/03/17 0334  INR 0.98   Cardiac Enzymes: No results for input(s): CKTOTAL, CKMB, CKMBINDEX, TROPONINI in the last 168 hours. BNP (last 3 results) No results for input(s): PROBNP  in the last 8760 hours. HbA1C: No results for input(s): HGBA1C in the last 72 hours. CBG:  Recent Labs Lab 03/28/17 2234 03/28/17 2350 03/29/17 0808 03/29/17 1238 03/29/17 1719  GLUCAP 78 77 67 150* 85   Lipid Profile: No results for input(s): CHOL, HDL, LDLCALC, TRIG, CHOLHDL, LDLDIRECT in the last 72 hours. Thyroid Function Tests: No results for input(s): TSH, T4TOTAL, FREET4, T3FREE, THYROIDAB in the last 72 hours. Anemia Panel: No results for input(s): VITAMINB12, FOLATE, FERRITIN, TIBC, IRON, RETICCTPCT in the last 72 hours. Sepsis Labs: No results for input(s): PROCALCITON, LATICACIDVEN in the last 168 hours.  Recent Results (from the past 240 hour(s))  MRSA PCR Screening     Status: None   Collection Time: 03/29/17  2:40 AM  Result Value Ref Range Status   MRSA by PCR NEGATIVE NEGATIVE Final  Comment:        The GeneXpert MRSA Assay (FDA approved for NASAL specimens only), is one component of a comprehensive MRSA colonization surveillance program. It is not intended to diagnose MRSA infection nor to guide or monitor treatment for MRSA infections.          Radiology Studies: No results found.      Scheduled Meds: . [MAR Hold] amLODipine  10 mg Oral QHS  . [MAR Hold] cinacalcet  30 mg Oral Once per day on Mon Wed Fri  . [MAR Hold] cloNIDine  0.3 mg Oral BID  . [MAR Hold] darbepoetin (ARANESP) injection - DIALYSIS  100 mcg Intravenous Q Sat-HD  . [MAR Hold] doxazosin  4 mg Oral QHS  . [MAR Hold] furosemide  40 mg Oral Daily  . [MAR Hold] heparin  5,000 Units Subcutaneous Q8H  . [MAR Hold] hydrALAZINE  75 mg Oral TID  . [MAR Hold] irbesartan  300 mg Oral Daily  . [MAR Hold] metoprolol succinate  50 mg Oral QHS  . [MAR Hold] multivitamin  1 tablet Oral Daily  . [MAR Hold] pantoprazole  40 mg Oral Daily  . [MAR Hold] pneumococcal 23 valent vaccine  0.5 mL Intramuscular Tomorrow-1000  . [MAR Hold] sevelamer carbonate  1,600 mg Oral TID WC    Continuous Infusions:   LOS: 5 days        Aline August, MD [facility] Pager [phone]  If 7PM-7AM, please contact night-coverage [email or web address] Password [email or web address] 04/03/2017, 10:00 AM

## 2017-04-03 NOTE — Clinical Social Work Note (Addendum)
CSW received consult regarding patient needing assistance with transportation to dialysis. Mr. John Woodard currently receives dialysis in [facility] though [place] [facility].  CSW talked with patient's brother-John Woodard - [phone] on 9/13 (4:05 pm-returning CSW's call) regarding patient's address as EPIC shows a PO [address]. Mr. John Woodard did not know why his brother goes to HD in [facility] and did not know the names of the transportation services his brother uses to get to dialysis. Mr. John Woodard provided their address - [address] [place] and reported that they live together and have been at this address for awhile. Per John Woodard, his brother has been on HD since 2003 and the center in [place] is the only one he is aware of patient going to. John Woodard reported that they have 2 sisters that live in [place].  On 9/13, talked with John Woodard in dialysis regarding patient and she indicated that they will be working on getting patient's HD set-up in [place].  On 9/14, CSW and John Woodard (HD staff person) talked with patient at the bedside and he provided history as to why he goes to [facility] for dialysis. John Woodard reported that he has used [facility], [employer] and Medicaid transportation. He indicated that the transportation had stopped coming to get him, but was not clear on which service he was currently using that stopped providing transport to him. Patient was informed that HD staff currently working on getting him set-up in [place].   CSW received a call from John Woodard (3:03 pm) reporting that [facility] will be working on setting patient up for dialysis. She does not have that number, but will try to find out how to contact [facility]. CSW will continue to follow and attempt to make contact with the transportation resources provided by patient to determine his status, and what is needed, if anything to set-up or continue his HD transportation.  John Woodard, MSW, LCSW Licensed Clinical Social Worker [place] [phone]

## 2017-04-03 NOTE — Op Note (Addendum)
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 The patient placed in supine position on the Angio table. Patient's left upper arm was prepped and draped in usual sterile fashion. Local anesthesia was infiltrated over the AV fistula near the wrist. A micropuncture needle was used to cannulate AV fistula without difficulty. The micropuncture wire was advanced into the fistula. Micropuncture sheath was then placed over this. Contrast angiogram was then performed. Central venogram was performed which shows a central veins in the left side a widely patent. The remainder of the fistula is patent and refluxes across the arterial anastomosis without difficulty. There is a long segment fairly tubular stenosis of about 75% extending from just above the elbow joint to the mid upper arm. At this point the patient was given 3000 units of intravenous heparin. The micropuncture wire was exchanged for an 035 Bentson wire. There was some tortuosity in the outflow vein just before he got to the elbow. Therefore after placing a 6 French short sheath over the guidewire into the fistula I then used an 035 Berenstein 2 catheter to direct on 035 angled Glidewire into the more distal fistula. I was able to advance the Glidewire all 11 of the chest and this straightened the vein. The Berenstein catheter was then advanced over the Glidewire all the way to level shoulder the Glidewire was then removed and exchanged for the Bentson wire again. This point a 6 mm x 6 cm length Angio plasty balloon was advanced and centered on the lesion and  inflated to nominal pressure for 1 minute. An overlapping inflation was then done more proximal segment again to nominal pressure for 1 minute. Completion angiogram surgery some improvement but still residual stenosis. Subsequently used a 7 x 8 mm angioplasty balloon and inflated this to nominal pressure for 1 minute. The patient experienced some discomfort with this. He was given something for pain medication. The balloon was then deflated after 1 minute. There was still a small amount of residual stenosis I brought an 8 x 4 Angio plasty balloon on the operative field and advanced up to the more distal segment of the lesion which was tighter. This was inflated to nominal pressure for 1 minute. Completion angiogram showed a residual stenosis but this was less than 20% and the patient was experiencing pain with each inflation I was worried that we would end up with a rupture of the vein. At this point the procedure was concluded. Venous outflow is definitely over 6 mm in diameter at this point.  Operative management: The patient's venous outflow stenosis is amenable to additional balloon angioplasty of necessary. However, the segment is about 4-6 cm in length and may be at high risk for recurrent stenosis.  Ruta Hinds, MD [facility] Office: [phone] Pager: [phone]

## 2017-04-03 NOTE — Progress Notes (Signed)
  [facility] Progress Note   Assessment/ Plan:    1.  Hyperkalemia/Uremia D/T missed HD. Now back on HD schedule.  Will continue TTS.  2.  ESRD -  T,Th,S. Having transportation issues.  SW consulted earlier this hospitalization, have asked hospitalist to ensure they see him to assist with transportation, appreciate assistance.   3.  Hypertension/volume  -On multiple antihypertensive meds.  Remains hypertensive, home meds have been resumed.  Expect to improve with UF, will continue to challenge EDW 4.  Anemia  - HGB 9.4 Fe 57 Tsat 24%.  Only got 1 dose of Fe so far- have reordered. 5.  Metabolic bone disease -  Ca 8.0. Binders, sensipar resumed. 6.  Nutrition - Albumin 3.9 Renal diet. Add renal vit. . 7. Possible New onset of AFIB-being worked up per primary. 8. Access issues- arterial pressures quite Tuesday, issue on Sunday as well.  VVS c/s, appreciate.  Fistulogram today.  Subjective:    For fistulogram today.   Objective:   BP (!) 193/139 (BP Location: Right Arm)   Pulse (!) 101   Temp 98.1 F (36.7 C) (Oral)   Resp 18   Ht 6\' 2"  (1.88 m)   Wt 77 kg (169 lb 12.1 oz)   SpO2 100%   BMI 21.80 kg/m   Physical Exam: Gen:NAD ZOX:[id number], irregular, no longer +S3 Resp:normal WOB, muffled at bases Abd: mildly distended GU: + suprapubic catheter with minimal purulent material Ext:2+ pedal edema ACCESS: tortuous L forearm fistula- bruit heard throughout.  ?noncompressible area near area of cannulation--> stenosis vs impending clot?  Labs: BMET  Recent Labs Lab 03/28/17 1512 03/29/17 0035 03/31/17 0752 04/02/17 0327 04/03/17 0334  NA 138 136 137 138 137  K 6.3* 4.7 4.1 3.9 4.5  CL 101 99* 100* 100* 100*  CO2 17* 19* 24 25 26   GLUCOSE 87 67 85 79 84  BUN 119* 118* 50* 61* 36*  CREATININE 27.64* 31.86* 14.29* 14.00* 10.05*  CALCIUM 8.3* 8.0* 8.1* 8.1* 8.9  PHOS  --   --  6.1*  --   --    CBC  Recent Labs Lab 03/30/17 0221 03/31/17 0753  04/02/17 0327 04/03/17 0334  WBC 5.0 3.3* 3.1* 3.6*  NEUTROABS  --   --  1.3*  --   HGB 9.4* 9.2* 8.7* 9.7*  HCT 28.8* 28.6* 26.3* 30.6*  MCV 82.8 83.1 82.7 84.8  PLT 103* 92* 87* 98*    @IMGRELPRIORS @ Medications:    . amLODipine  10 mg Oral QHS  . cinacalcet  30 mg Oral Once per day on Mon Wed Fri  . cloNIDine  0.3 mg Oral BID  . [START ON 04/04/2017] darbepoetin (ARANESP) injection - DIALYSIS  100 mcg Intravenous Q Sat-HD  . doxazosin  4 mg Oral QHS  . furosemide  40 mg Oral Daily  . heparin  5,000 Units Subcutaneous Q8H  . hydrALAZINE  75 mg Oral TID  . irbesartan  300 mg Oral Daily  . metoprolol succinate  50 mg Oral QHS  . multivitamin  1 tablet Oral Daily  . pantoprazole  40 mg Oral Daily  . pneumococcal 23 valent vaccine  0.5 mL Intramuscular Tomorrow-1000  . sevelamer carbonate  1,600 mg Oral TID WC     Madelon Lips, MD [facility] pgr [phone] 04/03/2017, 9:23 AM

## 2017-04-03 NOTE — H&P (View-Only) (Signed)
[facility] NOTE   MRN : [id number]  Reason for Consult: malfunctioning AV fistula  Requesting Physician:  Dr. Hollie Salk MRN #:  [id number]  History of Present Illness: This is a 46 y.o. male states he has had a working fistula left radial cephalic.  He states he has had a few problems in the past, but not very clear as to what the trouble was.  He has hemodialysis T,Th,S at [facility]. PMH of HTN, mental retardation, GERD, neurogenic bladder, GERD, SHPT, AOCD.   No other family in room at this time.    Current Facility-Administered Medications  Medication Dose Route Frequency Provider Last Rate Last Dose  . acetaminophen (TYLENOL) tablet 500 mg  500 mg Oral Q6H PRN Ivor Costa, MD   500 mg at 03/30/17 0620  . amLODipine (NORVASC) tablet 10 mg  10 mg Oral QHS Ivor Costa, MD   10 mg at 03/31/17 2335  . cinacalcet (SENSIPAR) tablet 30 mg  30 mg Oral Once per day on Mon Wed Fri Niu, Xilin, MD   30 mg at 04/01/17 0813  . cloNIDine (CATAPRES) tablet 0.3 mg  0.3 mg Oral BID Ivor Costa, MD   0.3 mg at 04/01/17 0813  . doxazosin (CARDURA) tablet 4 mg  4 mg Oral QHS Ivor Costa, MD   4 mg at 03/31/17 2335  . furosemide (LASIX) tablet 40 mg  40 mg Oral Daily Ivor Costa, MD   40 mg at 04/01/17 0813  . heparin injection 5,000 Units  5,000 Units Subcutaneous Q8H Ivor Costa, MD   5,000 Units at 03/31/17 2335  . hydrALAZINE (APRESOLINE) injection 5 mg  5 mg Intravenous Q2H PRN Ivor Costa, MD   5 mg at 03/29/17 2337  . irbesartan (AVAPRO) tablet 300 mg  300 mg Oral Daily Ivor Costa, MD   300 mg at 04/01/17 0814  . metoprolol succinate (TOPROL-XL) 24 hr tablet 25 mg  25 mg Oral QHS Ivor Costa, MD   25 mg at 03/31/17 2334  . multivitamin (RENA-VIT) tablet 1 tablet  1 tablet Oral Daily Ivor Costa, MD   1 tablet at 04/01/17 0813  . ondansetron (ZOFRAN) injection 4 mg  4 mg Intravenous Q8H PRN Ivor Costa, MD      . pantoprazole (PROTONIX) EC tablet  40 mg  40 mg Oral Daily Ivor Costa, MD   40 mg at 04/01/17 0814  . pneumococcal 23 valent vaccine (PNU-IMMUNE) injection 0.5 mL  0.5 mL Intramuscular Tomorrow-1000 Ivor Costa, MD      . sevelamer carbonate (RENVELA) tablet 1,600 mg  1,600 mg Oral TID WC Ivor Costa, MD   1,600 mg at 04/01/17 0814  . sevelamer carbonate (RENVELA) tablet 800 mg  800 mg Oral PRN Ivor Costa, MD      . zolpidem (AMBIEN) tablet 5 mg  5 mg Oral QHS PRN Ivor Costa, MD        Pt meds include: Statin :No Betablocker: Yes ASA: No Other anticoagulants/antiplatelets: none  Past Medical History:  Diagnosis Date  . Dialysis patient ([place])   . Dyspnea   . ESRD on dialysis ([place])   . Hypertension   . Renal disorder     Past Surgical History:  Procedure Laterality Date  . AVF Left   . VASCULAR SURGERY      Social History Social History  Substance Use Topics  . Smoking status: Never Smoker  . Smokeless tobacco: Never Used  . Alcohol  use No    Family History Family History  Problem Relation Age of Onset  . Hypertension Mother   . Diabetes Mellitus II Sister     Allergies  Allergen Reactions  . Naproxen Other (See Comments)    "heart stopped one time"  . Lactose Intolerance (Gi) Diarrhea and Other (See Comments)    Blood in stools  . Penicillins Other (See Comments)    unknown     REVIEW OF SYSTEMS  General: [ ]  Weight loss, [ ]  Fever, [ ]  chills Neurologic: [ ]  Dizziness, [ ]  Blackouts, [ ]  Seizure [ ]  Stroke, [ ]  "Mini stroke", [ ]  Slurred speech, [ ]  Temporary blindness; [ ]  weakness in arms or legs, [ ]  Hoarseness [ ]  Dysphagia Cardiac: [ ]  Chest pain/pressure, [ ]  Shortness of breath at rest [ ]  Shortness of breath with exertion, [ ]  Atrial fibrillation or irregular heartbeat  Vascular: [ ]  Pain in legs with walking, [ ]  Pain in legs at rest, [ ]  Pain in legs at night,  [ ]  Non-healing ulcer, [ ]  Blood clot in vein/DVT,   Pulmonary: [ ]  Home oxygen, [ ]  Productive cough, [ ]  Coughing up  blood, [ ]  Asthma,  [ ]  Wheezing [ ]  COPD Musculoskeletal:  [ ]  Arthritis, [ ]  Low back pain, [ ]  Joint pain Hematologic: [ ]  Easy Bruising, [ ]  Anemia; [ ]  Hepatitis Gastrointestinal: [ ]  Blood in stool, [ ]  Gastroesophageal Reflux/heartburn, Urinary: [x ] chronic Kidney disease, [x ] on HD - [ ]  MWF or [x ] TTHS, [ ]  Burning with urination, [ ]  Difficulty urinating Skin: [ ]  Rashes, [ ]  Wounds Psychological: [ ]  Anxiety, [ ]  Depression  Physical Examination Vitals:   03/31/17 1151 03/31/17 1715 03/31/17 2143 04/01/17 0535  BP: (!) 197/132 (!) 160/85 (!) 182/115 (!) 159/115  Pulse: 100 86 100 78  Resp: 18 18 20 19   Temp: 98 F (36.7 C) 98.5 F (36.9 C) (!) 97.4 F (36.3 C) 97.8 F (36.6 C)  TempSrc: Oral Oral Oral Oral  SpO2: 100% 100% 100% 100%  Weight: 174 lb 6.1 oz (79.1 kg)  174 lb 9.7 oz (79.2 kg)   Height:       Body mass index is 22.42 kg/m.  General:  WDWN in NAD Gait: Normal HENT: WNL Eyes: Pupils equal Pulmonary: normal non-labored breathing , without Rales, rhonchi,  wheezing Cardiac: RRR, without  Murmurs, rubs or gallops; No carotid bruits Abdomen: soft, NT Skin: no rashes, ulcers noted;  no Gangrene , no cellulitis; no open wounds;   Vascular Exam/Pulses:palpable radial and brachial pulses.  Left fistula with 2+ palpable distal thrill, weakened thrill proximal 1/2 near antecubital area.     Musculoskeletal: no muscle wasting or atrophy; no edema  Neurologic: A&O X 3; Appropriate Affect ;  SENSATION: normal; MOTOR FUNCTION: 5/5 Symmetric Speech is fluent/normal, somewhat disorganized   Significant Diagnostic Studies: CBC Lab Results  Component Value Date   WBC 3.3 (L) 03/31/2017   HGB 9.2 (L) 03/31/2017   HCT 28.6 (L) 03/31/2017   MCV 83.1 03/31/2017   PLT 92 (L) 03/31/2017    BMET    Component Value Date/Time   NA 137 03/31/2017 0752   K 4.1 03/31/2017 0752   CL 100 (L) 03/31/2017 0752   CO2 24 03/31/2017 0752   GLUCOSE 85 03/31/2017  0752   BUN 50 (H) 03/31/2017 0752   CREATININE 14.29 (H) 03/31/2017 0752   CALCIUM 8.1 (L) 03/31/2017 8469  GFRNONAA 4 (L) 03/31/2017 0752   GFRAA 4 (L) 03/31/2017 5830   Estimated Creatinine Clearance: 7.1 mL/min (A) (by C-G formula based on SCr of 14.29 mg/dL (H)).  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  Will likely need a fistula gram with possible intervention.  Will discuss scheduling with Dr. Donnetta Hutching.   Laurence Slate [facility] 04/01/2017 11:54 AM  I have examined the patient, reviewed and agree with above. The patient is having high arterial pressures with hemodialysis. He is having difficulty making it to dialysis transportation issues. He does dialyze on Tuesday Thursday and Saturday. On physical exam he does have a well-developed fistula throughout his forearm. This is very pulsatile throughout its course. The cephalic vein just above the antecubital space is clearly occluded by physical exam.  In reviewing his chart he had undergone a fistulogram in [facility] with interventional radiology in May 2017. 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 Discussed this with the patient. I recommended that he undergo repeat fistulogram and probable angioplasty of the outflow vein. We will schedule this for his next nondialysis day on Friday  Curt Jews, MD 04/01/2017 3:33 PM

## 2017-04-04 LAB — CBC WITH DIFFERENTIAL/PLATELET
BASOS ABS: 0 10*3/uL (ref 0.0–0.1)
BASOS PCT: 1 %
EOS PCT: 3 %
Eosinophils Absolute: 0.1 10*3/uL (ref 0.0–0.7)
HCT: 31.6 % — ABNORMAL LOW (ref 39.0–52.0)
Hemoglobin: 9.8 g/dL — ABNORMAL LOW (ref 13.0–17.0)
Lymphocytes Relative: 32 %
Lymphs Abs: 1.4 10*3/uL (ref 0.7–4.0)
MCH: 26.2 pg (ref 26.0–34.0)
MCHC: 31 g/dL (ref 30.0–36.0)
MCV: 84.5 fL (ref 78.0–100.0)
MONO ABS: 0.3 10*3/uL (ref 0.1–1.0)
MONOS PCT: 6 %
Neutro Abs: 2.4 10*3/uL (ref 1.7–7.7)
Neutrophils Relative %: 58 %
PLATELETS: 122 10*3/uL — AB (ref 150–400)
RBC: 3.74 MIL/uL — ABNORMAL LOW (ref 4.22–5.81)
RDW: 15.5 % (ref 11.5–15.5)
WBC: 4.2 10*3/uL (ref 4.0–10.5)

## 2017-04-04 LAB — BASIC METABOLIC PANEL
ANION GAP: 12 (ref 5–15)
BUN: 53 mg/dL — ABNORMAL HIGH (ref 6–20)
CALCIUM: 8.9 mg/dL (ref 8.9–10.3)
CO2: 27 mmol/L (ref 22–32)
CREATININE: 12.62 mg/dL — AB (ref 0.61–1.24)
Chloride: 97 mmol/L — ABNORMAL LOW (ref 101–111)
GFR, EST AFRICAN AMERICAN: 5 mL/min — AB (ref >60)
GFR, EST NON AFRICAN AMERICAN: 4 mL/min — AB (ref >60)
GLUCOSE: 87 mg/dL (ref 65–99)
Potassium: 5.2 mmol/L — ABNORMAL HIGH (ref 3.5–5.1)
Sodium: 136 mmol/L (ref 135–145)

## 2017-04-04 LAB — MAGNESIUM: Magnesium: 2.6 mg/dL — ABNORMAL HIGH (ref 1.7–2.4)

## 2017-04-04 MED ORDER — CLONIDINE HCL 0.3 MG PO TABS
0.3000 mg | ORAL_TABLET | Freq: Three times a day (TID) | ORAL | Status: DC
  Administered 2017-04-04 – 2017-04-08 (×12): 0.3 mg via ORAL
  Filled 2017-04-04 (×12): qty 1

## 2017-04-04 MED ORDER — DARBEPOETIN ALFA 100 MCG/0.5ML IJ SOSY
PREFILLED_SYRINGE | INTRAMUSCULAR | Status: AC
  Filled 2017-04-04: qty 0.5

## 2017-04-04 NOTE — Progress Notes (Signed)
  [facility] Progress Note   Assessment/ Plan:    1.  Hyperkalemia/Uremia D/T missed HD. Now back on HD schedule.  Will continue TTS.  2.  ESRD -  T,Th,S. Having transportation issues.  SW consulted earlier this hospitalization, have asked hospitalist to ensure they see him to assist with transportation, appreciate assistance. He requests transferring dialysis centers- process ongoing- apparently paperwork had to be sent in to central processing rather than through our traditional CLIP process?  3.  Hypertension/volume  -On multiple antihypertensive meds.  Remains hypertensive, home meds have been resumed.  Expect to improve with UF, will continue to challenge EDW. 4.  Anemia  - HGB 9.4 Fe 57 Tsat 24%.  Fe load 5.  Metabolic bone disease -  Ca 8.0. Binders, sensipar resumed. 6.  Nutrition - Albumin 3.9 Renal diet. Add renal vit.  7. Possible New onset of AFIB- per primary 8. Access issues- arterial pressures quite high.  VVS c/s, appreciate.  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    Subjective:    S/p fistulogram yesterday.     Objective:   BP (!) 188/122   Pulse 96   Temp 97.8 F (36.6 C) (Oral)   Resp 12   Ht 6\' 2"  (1.88 m)   Wt 78.8 kg (173 lb 11.6 oz) Comment: Standing Weight  SpO2 100%   BMI 22.30 kg/m   Physical Exam: Gen:NAD EYC:[id number], irregular, no longer +S3 Resp:normal WOB, muffled at bases Abd: mildly distended GU: + suprapubic catheter with minimal purulent material Ext:2+ pedal edema ACCESS: tortuous L forearm fistula- bruit heard throughout.  Accessed  Labs: [employer]  Recent Labs Lab 03/28/17 1512 03/29/17 0035 03/31/17 0752 04/02/17 0327 04/03/17 0334 04/04/17 0319  NA 138 136 137 138 137 136  K 6.3* 4.7 4.1 3.9 4.5 5.2*  CL 101 99* 100* 100* 100* 97*  CO2 17* 19* 24 25 26 27   GLUCOSE 87 67 85 79 84 87  BUN 119* 118* 50* 61* 36* 53*  CREATININE 27.64* 31.86* 14.29* 14.00* 10.05* 12.62*   CALCIUM 8.3* 8.0* 8.1* 8.1* 8.9 8.9  PHOS  --   --  6.1*  --   --   --    CBC  Recent Labs Lab 03/31/17 0753 04/02/17 0327 04/03/17 0334 04/04/17 0319  WBC 3.3* 3.1* 3.6* 4.2  NEUTROABS  --  1.3*  --  2.4  HGB 9.2* 8.7* 9.7* 9.8*  HCT 28.6* 26.3* 30.6* 31.6*  MCV 83.1 82.7 84.8 84.5  PLT 92* 87* 98* 122*    @IMGRELPRIORS @ Medications:    . amLODipine  10 mg Oral QHS  . cinacalcet  30 mg Oral Once per day on Mon Wed Fri  . cloNIDine  0.3 mg Oral TID  . darbepoetin (ARANESP) injection - DIALYSIS  100 mcg Intravenous Q Sat-HD  . doxazosin  4 mg Oral QHS  . furosemide  40 mg Oral Daily  . heparin  5,000 Units Subcutaneous Q8H  . hydrALAZINE  100 mg Oral TID  . irbesartan  300 mg Oral Daily  . metoprolol succinate  50 mg Oral QHS  . multivitamin  1 tablet Oral Daily  . pantoprazole  40 mg Oral Daily  . pneumococcal 23 valent vaccine  0.5 mL Intramuscular Tomorrow-1000  . sevelamer carbonate  1,600 mg Oral TID WC  . sodium chloride flush  3 mL Intravenous Q12H     Madelon Lips, MD [facility] pgr [phone] 04/04/2017, 11:25 AM

## 2017-04-04 NOTE — Procedures (Signed)
Patient seen and examined on Hemodialysis. QB 400 UF goal 3L.  Cramped today, UF turned off for last 1 hr treatment.  Treatment adjusted as needed.  Madelon Lips MD [facility] pgr [phone] 11:32 AM

## 2017-04-04 NOTE — Progress Notes (Signed)
Patient ID: John Woodard, male   DOB: 1971/06/04, 46 y.o.   MRN: [id number]  PROGRESS NOTE    John Woodard  DQQ:[id number] DOB: 1970/11/01 DOA: 03/28/2017 PCP: Default, Provider, MD   Brief Narrative:  46 year old male with PMH of hard of hearing, ESRD on TTS HD (Dr. Kriste Basque, Nephrology), HTN, GERD, missed 4 sessions of dialysis due to transportation issues and presented to the ED on 9/8 feeling that he needed dialysis now, found to have potassium of 6.3, bicarbonate 17, BUN 119 and creatinine of 27.6. Hyperkalemia treated with medications and resolved. Nephrology consulted and undergoing serial dialysis. Vascular surgery also consulted.  Assessment & Plan:   Principal Problem:   Hyperkalemia Active Problems:   Hypertension   ESRD on dialysis ([place])   Uremia   Pancytopenia (HCC)   GERD (gastroesophageal reflux disease)   1. Hyperkalemia: Due to missed dialysis. Initially treated with medications in ED. Now back on hemodialysis. Resolved. Repeat a.m. labs 2. ESRD on JH:ERDEYCXKGY follow-up appreciated. Continue hemodialysis as per nephrology schedule and recommendations. Patient had left arm fistulogram with angioplasty of venous outflow performed by vascular surgery on 04/03/2017 3. Essential hypertension: Still Uncontrolled. Continue amlodipine, doxazosin, Irbesartan and Lasix and metoprolol, hydralazine. Increase clonidine to 3 times a day. Follow nephrology recommendations  4. Pancytopenia: monitor, no sign of bleed, no fever. Repeat a.m. labs 5. GERD:PPI. 6. Paroxysmal Atrial fibrillation: Noted on telemetry and admitting EKG. Not known if this is old or new for him. Continue monitoring on telemetry. Currently rate controlled. May not be a good candidate for long-term anticoagulation due to compliance issues. Echo shows ejection fraction of 65-70% with grade 2 diastolic dysfunction. Outpatient follow-up with primary care provider and/or cardiology regarding a need for  anticoagulation 7. Noncompliance: Social issues and unable to get to HD. Follow up with Education officer, [facility]. 8. ? Cognitive issues: As per discussion with brother by prior hospitalist, mental status is at baseline. 9. History of suprapubic catheter: Spoke to on call urologist on 04/02/2017 on phone Dr. Alyson Ingles who would arrange for outpatient suprapubic catheter change next week.   DVT prophylaxis: Subcutaneous heparin Code Status:  Full Family Communication: None Disposition Plan: Pending improvement of clinical status and clearance from nephrology  Consultants: Nephrology and vascular surgery  Procedures:  echo on 04/01/2017: Study Conclusions  - Left ventricle: The cavity size was normal. There was severe   concentric hypertrophy. Systolic function was vigorous. The   estimated ejection fraction was in the range of 65% to 70%. Wall   motion was normal; there were no regional wall motion   abnormalities. Features are consistent with a pseudonormal left   ventricular filling pattern, with concomitant abnormal relaxation   and increased filling pressure (grade 2 diastolic dysfunction). - Aortic valve: There was mild regurgitation. - Mitral valve: There was mild regurgitation. - Left atrium: The atrium was severely dilated. - Right atrium: The atrium was severely dilated. - Tricuspid valve: There was moderate regurgitation.  Antimicrobials: None    Subjective: Patient seen and examined at bedside undergoing dialysis. He is awake but hard of hearing. No overnight fever, nausea or vomiting.  Objective: Vitals:   04/04/17 0930 04/04/17 1000 04/04/17 1029 04/04/17 1059  BP: (!) 184/113 (!) 165/113 (!) 191/103 (!) 188/122  Pulse: 89 85 96 96  Resp: 13 15 14 12   Temp:      TempSrc:      SpO2:      Weight:      Height:  Intake/Output Summary (Last 24 hours) at 04/04/17 1123 Last data filed at 04/04/17 0600  Gross per 24 hour  Intake                3 ml  Output                 0 ml  Net                3 ml   Filed Weights   04/02/17 2036 04/03/17 2051 04/04/17 0745  Weight: 77 kg (169 lb 12.1 oz) 78.4 kg (172 lb 14.4 oz) 78.8 kg (173 lb 11.6 oz)    Examination:  General exam: Appears calm and comfortable  Respiratory system: Bilateral decreased breath sound at bases with some scattered crackles Cardiovascular system: S1 & S2 heard, Rate controlled  Gastrointestinal system: Abdomen is nondistended, soft and nontender. Normal bowel sounds heard. Suprapubic catheter present Extremities: No cyanosis, clubbing, edema    Data Reviewed: I have personally reviewed following labs and imaging studies  CBC:  Recent Labs Lab 03/30/17 0221 03/31/17 0753 04/02/17 0327 04/03/17 0334 04/04/17 0319  WBC 5.0 3.3* 3.1* 3.6* 4.2  NEUTROABS  --   --  1.3*  --  2.4  HGB 9.4* 9.2* 8.7* 9.7* 9.8*  HCT 28.8* 28.6* 26.3* 30.6* 31.6*  MCV 82.8 83.1 82.7 84.8 84.5  PLT 103* 92* 87* 98* 010*   Basic Metabolic Panel:  Recent Labs Lab 03/29/17 0035 03/31/17 0752 04/02/17 0327 04/03/17 0334 04/04/17 0319  NA 136 137 138 137 136  K 4.7 4.1 3.9 4.5 5.2*  CL 99* 100* 100* 100* 97*  CO2 19* 24 25 26 27   GLUCOSE 67 85 79 84 87  BUN 118* 50* 61* 36* 53*  CREATININE 31.86* 14.29* 14.00* 10.05* 12.62*  CALCIUM 8.0* 8.1* 8.1* 8.9 8.9  MG  --   --  2.2  --  2.6*  PHOS  --  6.1*  --   --   --    GFR: Estimated Creatinine Clearance: 8 mL/min (A) (by C-G formula based on SCr of 12.62 mg/dL (H)). Liver Function Tests:  Recent Labs Lab 03/28/17 1512 03/31/17 0752  AST 23  --   ALT 12*  --   ALKPHOS 55  --   BILITOT 1.2  --   PROT 7.0  --   ALBUMIN 3.9 3.3*   No results for input(s): LIPASE, AMYLASE in the last 168 hours. No results for input(s): AMMONIA in the last 168 hours. Coagulation Profile:  Recent Labs Lab 04/03/17 0334  INR 0.98   Cardiac Enzymes: No results for input(s): CKTOTAL, CKMB, CKMBINDEX, TROPONINI in the last 168 hours. BNP  (last 3 results) No results for input(s): PROBNP in the last 8760 hours. HbA1C: No results for input(s): HGBA1C in the last 72 hours. CBG:  Recent Labs Lab 03/28/17 2234 03/28/17 2350 03/29/17 0808 03/29/17 1238 03/29/17 1719  GLUCAP 78 77 67 150* 85   Lipid Profile: No results for input(s): CHOL, HDL, LDLCALC, TRIG, CHOLHDL, LDLDIRECT in the last 72 hours. Thyroid Function Tests: No results for input(s): TSH, T4TOTAL, FREET4, T3FREE, THYROIDAB in the last 72 hours. Anemia Panel: No results for input(s): VITAMINB12, FOLATE, FERRITIN, TIBC, IRON, RETICCTPCT in the last 72 hours. Sepsis Labs: No results for input(s): PROCALCITON, LATICACIDVEN in the last 168 hours.  Recent Results (from the past 240 hour(s))  MRSA PCR Screening     Status: None   Collection Time: 03/29/17  2:40 AM  Result  Value Ref Range Status   MRSA by PCR NEGATIVE NEGATIVE Final    Comment:        The GeneXpert MRSA Assay (FDA approved for NASAL specimens only), is one component of a comprehensive MRSA colonization surveillance program. It is not intended to diagnose MRSA infection nor to guide or monitor treatment for MRSA infections.          Radiology Studies: No results found.      Scheduled Meds: . amLODipine  10 mg Oral QHS  . cinacalcet  30 mg Oral Once per day on Mon Wed Fri  . cloNIDine  0.3 mg Oral TID  . darbepoetin (ARANESP) injection - DIALYSIS  100 mcg Intravenous Q Sat-HD  . doxazosin  4 mg Oral QHS  . furosemide  40 mg Oral Daily  . heparin  5,000 Units Subcutaneous Q8H  . hydrALAZINE  100 mg Oral TID  . irbesartan  300 mg Oral Daily  . metoprolol succinate  50 mg Oral QHS  . multivitamin  1 tablet Oral Daily  . pantoprazole  40 mg Oral Daily  . pneumococcal 23 valent vaccine  0.5 mL Intramuscular Tomorrow-1000  . sevelamer carbonate  1,600 mg Oral TID WC  . sodium chloride flush  3 mL Intravenous Q12H   Continuous Infusions: . sodium chloride       LOS: 6  days        Aline August, MD [facility] Pager [phone]  If 7PM-7AM, please contact night-coverage [email or web address] Password [facility] 04/04/2017, 11:23 AM

## 2017-04-05 LAB — BASIC METABOLIC PANEL
Anion gap: 13 (ref 5–15)
BUN: 32 mg/dL — AB (ref 6–20)
CHLORIDE: 94 mmol/L — AB (ref 101–111)
CO2: 30 mmol/L (ref 22–32)
Calcium: 9.6 mg/dL (ref 8.9–10.3)
Creatinine, Ser: 9.58 mg/dL — ABNORMAL HIGH (ref 0.61–1.24)
GFR calc Af Amer: 7 mL/min — ABNORMAL LOW (ref >60)
GFR calc non Af Amer: 6 mL/min — ABNORMAL LOW (ref >60)
GLUCOSE: 93 mg/dL (ref 65–99)
POTASSIUM: 3.8 mmol/L (ref 3.5–5.1)
Sodium: 137 mmol/L (ref 135–145)

## 2017-04-05 LAB — CBC WITH DIFFERENTIAL/PLATELET
Basophils Absolute: 0 10*3/uL (ref 0.0–0.1)
Basophils Relative: 1 %
EOS ABS: 0.1 10*3/uL (ref 0.0–0.7)
EOS PCT: 3 %
HCT: 33.9 % — ABNORMAL LOW (ref 39.0–52.0)
HEMOGLOBIN: 10.3 g/dL — AB (ref 13.0–17.0)
LYMPHS ABS: 1.3 10*3/uL (ref 0.7–4.0)
LYMPHS PCT: 36 %
MCH: 25.9 pg — AB (ref 26.0–34.0)
MCHC: 30.4 g/dL (ref 30.0–36.0)
MCV: 85.4 fL (ref 78.0–100.0)
MONOS PCT: 6 %
Monocytes Absolute: 0.2 10*3/uL (ref 0.1–1.0)
Neutro Abs: 2 10*3/uL (ref 1.7–7.7)
Neutrophils Relative %: 54 %
PLATELETS: 173 10*3/uL (ref 150–400)
RBC: 3.97 MIL/uL — AB (ref 4.22–5.81)
RDW: 15.5 % (ref 11.5–15.5)
WBC: 3.7 10*3/uL — AB (ref 4.0–10.5)

## 2017-04-05 LAB — MAGNESIUM: Magnesium: 2.3 mg/dL (ref 1.7–2.4)

## 2017-04-05 NOTE — Progress Notes (Signed)
[facility] Progress Note   Assessment/ Plan:    1.  Hyperkalemia/Uremia D/T missed HD. Now back on HD schedule.  Will continue TTS.  2.  ESRD -  T,Th,S. Having transportation issues.  SW consulted earlier this hospitalization, have asked hospitalist to ensure they see him to assist with transportation, appreciate assistance. He requests transferring dialysis centers- process ongoing- apparently paperwork had to be sent in to central processing rather than through our traditional CLIP process?   Next HD planned 9/18 3.  Hypertension/volume  -On multiple antihypertensive meds.  Remains hypertensive, home meds have been resumed.  I estimate that his EDW is around 76.5-77 kg 4.  Anemia  - HGB 9.4 Fe 57 Tsat 24%.  Fe load 5.  Metabolic bone disease -  Ca 8.0. Binders, sensipar resumed. 6.  Nutrition - Albumin 3.9 Renal diet. Add renal vit.  7. Possible New onset of AFIB- per primary 8. Access issues- arterial pressures quite high.  VVS c/s, appreciate.  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   9. Dispo: when accepted by HD center.    Subjective:    No complaints- HD yesterday and I think we have reached EDW.     Objective:   BP 135/88 (BP Location: Right Arm)   Pulse 79   Temp (!) 97.5 F (36.4 C) (Oral)   Resp 20   Ht 6\' 2"  (1.88 m)   Wt 76.2 kg (168 lb)   SpO2 100%   BMI 21.57 kg/m   Physical Exam: Gen:NAD, sitting up eating lunch DDU:[id number], irregular, soft systolic murmur Resp:normal WOB, clear bilaterally Abd: nondistended GU: + suprapubic catheter not examined today Ext: trace pedal edema ACCESS: tortuous L forearm fistula- bruit heard throughout.  Labs: [employer]  Recent Labs Lab 03/31/17 0752 04/02/17 0327 04/03/17 0334 04/04/17 0319 04/05/17 0555  NA 137 138 137 136 137  K 4.1 3.9 4.5 5.2* 3.8  CL 100* 100* 100* 97* 94*  CO2 24 25 26 27 30   GLUCOSE 85 79 84 87 93  BUN 50* 61* 36* 53* 32*  CREATININE  14.29* 14.00* 10.05* 12.62* 9.58*  CALCIUM 8.1* 8.1* 8.9 8.9 9.6  PHOS 6.1*  --   --   --   --    CBC  Recent Labs Lab 04/02/17 0327 04/03/17 0334 04/04/17 0319 04/05/17 0555  WBC 3.1* 3.6* 4.2 3.7*  NEUTROABS 1.3*  --  2.4 2.0  HGB 8.7* 9.7* 9.8* 10.3*  HCT 26.3* 30.6* 31.6* 33.9*  MCV 82.7 84.8 84.5 85.4  PLT 87* 98* 122* 173    @IMGRELPRIORS @ Medications:    . amLODipine  10 mg Oral QHS  . cinacalcet  30 mg Oral Once per day on Mon Wed Fri  . cloNIDine  0.3 mg Oral TID  . darbepoetin (ARANESP) injection - DIALYSIS  100 mcg Intravenous Q Sat-HD  . doxazosin  4 mg Oral QHS  . furosemide  40 mg Oral Daily  . heparin  5,000 Units Subcutaneous Q8H  . hydrALAZINE  100 mg Oral TID  . irbesartan  300 mg Oral Daily  . metoprolol succinate  50 mg Oral QHS  . multivitamin  1 tablet Oral Daily  . pantoprazole  40 mg Oral Daily  . pneumococcal 23 valent vaccine  0.5 mL Intramuscular Tomorrow-1000  . sevelamer carbonate  1,600 mg Oral TID WC  . sodium chloride flush  3 mL Intravenous Q12H     Madelon Lips, MD [facility] pgr [phone] 04/05/2017,  1:04 PM

## 2017-04-05 NOTE — Progress Notes (Signed)
Patient ID: John Woodard, male   DOB: [birth year], 46 y.o.   MRN: [id number]  PROGRESS NOTE    John Woodard  [id number]:[id number] DOB: [birth year] DOA: 03/28/2017 PCP: Default, Provider, MD   Brief Narrative:  46 year old male with PMH of hard of hearing, ESRD on TTS HD (Dr. Kriste Basque, Nephrology), HTN, GERD, missed 4 sessions of dialysis due to transportation issues and presented to the ED on 9/8 feeling that he needed dialysis now, found to have potassium of 6.3, bicarbonate 17, BUN 119 and creatinine of 27.6. Hyperkalemia treated with medications and resolved. Nephrology consulted and undergoing serial dialysis. Vascular surgery also consulted.  Assessment & Plan:   Principal Problem:   Hyperkalemia Active Problems:   Hypertension   ESRD on dialysis ([place])   Uremia   Pancytopenia (HCC)   GERD (gastroesophageal reflux disease)   1. Hyperkalemia: Due to missed dialysis. Initially treated with medications in ED. Now back on hemodialysis. Resolved. Repeat a.m. labs 2. ESRD on MV:[id number] follow-up appreciated. Continue hemodialysis as per nephrology schedule and recommendations. Patient had left arm fistulogram with angioplasty of venous outflow performed by vascular surgery on 04/03/2017 3. Essential hypertension: Blood pressure better controlled after increasing the dose of clonidine yesterday. Continue current doses of amlodipine, doxazosin, Irbesartan and Lasix and metoprolol, hydralazine and clonidine. Follow nephrology recommendations  4. Pancytopenia: Improving; monitor, no sign of bleed, no fever. Repeat a.m. labs 5. GERD:PPI. 6. Paroxysmal Atrial fibrillation: Noted on telemetry and admitting EKG. Not known if this is old or new for him. Continue monitoring on telemetry. Currently rate controlled. May not be a good candidate for long-term anticoagulation due to compliance issues. Echo shows ejection fraction of 65-70% with grade 2 diastolic dysfunction. Outpatient follow-up with primary  care provider and/or cardiology regarding a need for anticoagulation 7. Noncompliance: Social issues and unable to get to HD. Follow up with Education officer, [employer]. 8. ? Cognitive issues: As per discussion with brother by prior hospitalist, mental status is at baseline. 9. History of suprapubic catheter: Spoke to on call urologist on 04/02/2017 on phone Dr. Alyson Ingles who would arrange for outpatient suprapubic catheter change next week.   DVT prophylaxis: Subcutaneous heparin Code Status:  Full Family Communication: None Disposition Plan:  probable discharge in the next 1-2 days if cleared by nephrology and outpatient hemodialysis arranged by social worker  Consultants: Nephrology and vascular surgery  Procedures:  echo on 04/01/2017: Study Conclusions  - Left ventricle: The cavity size was normal. There was severe   concentric hypertrophy. Systolic function was vigorous. The   estimated ejection fraction was in the range of 65% to 70%. Wall   motion was normal; there were no regional wall motion   abnormalities. Features are consistent with a pseudonormal left   ventricular filling pattern, with concomitant abnormal relaxation   and increased filling pressure (grade 2 diastolic dysfunction). - Aortic valve: There was mild regurgitation. - Mitral valve: There was mild regurgitation. - Left atrium: The atrium was severely dilated. - Right atrium: The atrium was severely dilated. - Tricuspid valve: There was moderate regurgitation.  Antimicrobials: None    Subjective: Patient seen and examined at bedside undergoing dialysis. He is awake but hard of hearing. No overnight fever, nausea or vomiting or shortness of breath.  Objective: Vitals:   04/04/17 1707 04/04/17 2059 04/05/17 0613 04/05/17 0944  BP: (!) 160/92 129/74 (!) 140/93 135/88  Pulse: 77 84 85 79  Resp: 16 16 20 20   Temp: 98.5 F (36.9 C) 98.3 F (36.8 C)  97.8 F (36.6 C) (!) 97.5 F (36.4 C)  TempSrc: Oral Oral  Oral Oral  SpO2: 100% 100% 100% 100%  Weight:  76.2 kg (168 lb)    Height:        Intake/Output Summary (Last 24 hours) at 04/05/17 1028 Last data filed at 04/05/17 0905  Gross per 24 hour  Intake              920 ml  Output             2300 ml  Net            -1380 ml   Filed Weights   04/04/17 0745 04/04/17 1151 04/04/17 2059  Weight: 78.8 kg (173 lb 11.6 oz) 76.5 kg (168 lb 10.4 oz) 76.2 kg (168 lb)    Examination:  General exam: Appears calm and comfortable  Respiratory system: Bilateral decreased breath sound at bases with some scattered crackles Cardiovascular system: S1 & S2 heard, Rate controlled  Gastrointestinal system: Abdomen is nondistended, soft and nontender. Normal bowel sounds heard. Suprapubic catheter present Extremities: No cyanosis, clubbing, edema    Data Reviewed: I have personally reviewed following labs and imaging studies  CBC:  Recent Labs Lab 03/31/17 0753 04/02/17 0327 04/03/17 0334 04/04/17 0319 04/05/17 0555  WBC 3.3* 3.1* 3.6* 4.2 3.7*  NEUTROABS  --  1.3*  --  2.4 2.0  HGB 9.2* 8.7* 9.7* 9.8* 10.3*  HCT 28.6* 26.3* 30.6* 31.6* 33.9*  MCV 83.1 82.7 84.8 84.5 85.4  PLT 92* 87* 98* 122* 630   Basic Metabolic Panel:  Recent Labs Lab 03/31/17 0752 04/02/17 0327 04/03/17 0334 04/04/17 0319 04/05/17 0555  NA 137 138 137 136 137  K 4.1 3.9 4.5 5.2* 3.8  CL 100* 100* 100* 97* 94*  CO2 24 25 26 27 30   GLUCOSE 85 79 84 87 93  BUN 50* 61* 36* 53* 32*  CREATININE 14.29* 14.00* 10.05* 12.62* 9.58*  CALCIUM 8.1* 8.1* 8.9 8.9 9.6  MG  --  2.2  --  2.6* 2.3  PHOS 6.1*  --   --   --   --    GFR: Estimated Creatinine Clearance: 10.2 mL/min (A) (by C-G formula based on SCr of 9.58 mg/dL (H)). Liver Function Tests:  Recent Labs Lab 03/31/17 0752  ALBUMIN 3.3*   No results for input(s): LIPASE, AMYLASE in the last 168 hours. No results for input(s): AMMONIA in the last 168 hours. Coagulation Profile:  Recent Labs Lab  04/03/17 0334  INR 0.98   Cardiac Enzymes: No results for input(s): CKTOTAL, CKMB, CKMBINDEX, TROPONINI in the last 168 hours. BNP (last 3 results) No results for input(s): PROBNP in the last 8760 hours. HbA1C: No results for input(s): HGBA1C in the last 72 hours. CBG:  Recent Labs Lab 03/29/17 1238 03/29/17 1719  GLUCAP 150* 85   Lipid Profile: No results for input(s): CHOL, HDL, LDLCALC, TRIG, CHOLHDL, LDLDIRECT in the last 72 hours. Thyroid Function Tests: No results for input(s): TSH, T4TOTAL, FREET4, T3FREE, THYROIDAB in the last 72 hours. Anemia Panel: No results for input(s): VITAMINB12, FOLATE, FERRITIN, TIBC, IRON, RETICCTPCT in the last 72 hours. Sepsis Labs: No results for input(s): PROCALCITON, LATICACIDVEN in the last 168 hours.  Recent Results (from the past 240 hour(s))  MRSA PCR Screening     Status: None   Collection Time: 03/29/17  2:40 AM  Result Value Ref Range Status   MRSA by PCR NEGATIVE NEGATIVE Final    Comment:  The GeneXpert MRSA Assay (FDA approved for NASAL specimens only), is one component of a comprehensive MRSA colonization surveillance program. It is not intended to diagnose MRSA infection nor to guide or monitor treatment for MRSA infections.          Radiology Studies: No results found.      Scheduled Meds: . amLODipine  10 mg Oral QHS  . cinacalcet  30 mg Oral Once per day on Mon Wed Fri  . cloNIDine  0.3 mg Oral TID  . darbepoetin (ARANESP) injection - DIALYSIS  100 mcg Intravenous Q Sat-HD  . doxazosin  4 mg Oral QHS  . furosemide  40 mg Oral Daily  . heparin  5,000 Units Subcutaneous Q8H  . hydrALAZINE  100 mg Oral TID  . irbesartan  300 mg Oral Daily  . metoprolol succinate  50 mg Oral QHS  . multivitamin  1 tablet Oral Daily  . pantoprazole  40 mg Oral Daily  . pneumococcal 23 valent vaccine  0.5 mL Intramuscular Tomorrow-1000  . sevelamer carbonate  1,600 mg Oral TID WC  . sodium chloride flush  3  mL Intravenous Q12H   Continuous Infusions: . sodium chloride       LOS: 7 days        Aline August, MD [facility] Pager [phone]  If 7PM-7AM, please contact night-coverage [email or web address] Password [facility] 04/05/2017, 10:28 AM

## 2017-04-05 NOTE — Progress Notes (Signed)
Patient ID: John Woodard, male   DOB: 04-27-1971, 46 y.o.   MRN: [id number] No reports of difficulty with hemodialysis access on 04/04/2017 Excellent thrill in his fistula seems less pulsatile than preprocedure. Will not follow actively. Please call us if we can assist

## 2017-04-06 LAB — BASIC METABOLIC PANEL
ANION GAP: 11 (ref 5–15)
BUN: 50 mg/dL — ABNORMAL HIGH (ref 6–20)
CHLORIDE: 97 mmol/L — AB (ref 101–111)
CO2: 29 mmol/L (ref 22–32)
Calcium: 9 mg/dL (ref 8.9–10.3)
Creatinine, Ser: 12.3 mg/dL — ABNORMAL HIGH (ref 0.61–1.24)
GFR calc non Af Amer: 4 mL/min — ABNORMAL LOW (ref >60)
GFR, EST AFRICAN AMERICAN: 5 mL/min — AB (ref >60)
Glucose, Bld: 91 mg/dL (ref 65–99)
POTASSIUM: 4.4 mmol/L (ref 3.5–5.1)
Sodium: 137 mmol/L (ref 135–145)

## 2017-04-06 LAB — CBC WITH DIFFERENTIAL/PLATELET
BASOS ABS: 0 10*3/uL (ref 0.0–0.1)
BASOS PCT: 1 %
Eosinophils Absolute: 0.2 10*3/uL (ref 0.0–0.7)
Eosinophils Relative: 4 %
HEMATOCRIT: 27.7 % — AB (ref 39.0–52.0)
HEMOGLOBIN: 8.8 g/dL — AB (ref 13.0–17.0)
Lymphocytes Relative: 48 %
Lymphs Abs: 2.1 10*3/uL (ref 0.7–4.0)
MCH: 26.7 pg (ref 26.0–34.0)
MCHC: 31.8 g/dL (ref 30.0–36.0)
MCV: 84.2 fL (ref 78.0–100.0)
Monocytes Absolute: 0.4 10*3/uL (ref 0.1–1.0)
Monocytes Relative: 9 %
NEUTROS ABS: 1.6 10*3/uL — AB (ref 1.7–7.7)
NEUTROS PCT: 38 %
Platelets: 144 10*3/uL — ABNORMAL LOW (ref 150–400)
RBC: 3.29 MIL/uL — ABNORMAL LOW (ref 4.22–5.81)
RDW: 15.4 % (ref 11.5–15.5)
WBC: 4.3 10*3/uL (ref 4.0–10.5)

## 2017-04-06 LAB — MAGNESIUM: Magnesium: 2.4 mg/dL (ref 1.7–2.4)

## 2017-04-06 MED ORDER — RENA-VITE PO TABS
1.0000 | ORAL_TABLET | Freq: Every day | ORAL | Status: DC
  Administered 2017-04-06 – 2017-04-08 (×2): 1 via ORAL
  Filled 2017-04-06 (×2): qty 1

## 2017-04-06 NOTE — Progress Notes (Signed)
Patient ID: John Woodard, male   DOB: [birth year], 46 y.o.   MRN: [id number]  PROGRESS NOTE    John Woodard  MEQ:[id number] DOB: May 24, 1971 DOA: 03/28/2017 PCP: Default, Provider, MD   Brief Narrative:  46 year old male with PMH of hard of hearing, ESRD on TTS HD (Dr. Kriste Basque, Nephrology), HTN, GERD, missed 4 sessions of dialysis due to transportation issues and presented to the ED on 9/8 feeling that he needed dialysis now, found to have potassium of 6.3, bicarbonate 17, BUN 119 and creatinine of 27.6. Hyperkalemia treated with medications and resolved. Nephrology consulted and undergoing serial dialysis. Vascular surgery also consulted.  Assessment & Plan:   Principal Problem:   Hyperkalemia Active Problems:   Hypertension   ESRD on dialysis ([place])   Uremia   Pancytopenia (HCC)   GERD (gastroesophageal reflux disease)   1. Hyperkalemia: Due to missed dialysis. Initially treated with medications in ED. Now back on hemodialysis. Resolved. Repeat a.m. labs 2. ESRD on WL:NLGXQJJHER follow-up appreciated. Continue hemodialysis as per nephrology schedule and recommendations. Patient had left arm fistulogram with angioplasty of venous outflow performed by vascular surgery on 04/03/2017 3. Essential hypertension: Blood pressure better currently. Continue current doses of amlodipine, doxazosin, Irbesartan and Lasix and metoprolol, hydralazine and clonidine. Follow nephrology recommendations  4. Pancytopenia: Improving; monitor, no sign of bleed, no fever. Repeat a.m. labs 5. GERD:PPI. 6. Paroxysmal Atrial fibrillation: Noted on telemetry and admitting EKG. Not known if this is old or new for him. Continue monitoring on telemetry. Currently rate controlled. May not be a good candidate for long-term anticoagulation due to compliance issues. Echo shows ejection fraction of 65-70% with grade 2 diastolic dysfunction. Outpatient follow-up with primary care provider and/or cardiology regarding a need  for anticoagulation 7. Noncompliance: Social issues and unable to get to HD. Follow up with social worker regarding arrangement for transportation to dialysis.  8. ? Cognitive issues: As per discussion with brother by prior hospitalist, mental status is at baseline. 9. History of suprapubic catheter: Spoke to on call urologist on 04/02/2017 on phone Dr. Alyson Ingles who would arrange for outpatient suprapubic catheter change this week.   DVT prophylaxis: Subcutaneous heparin Code Status:  Full Family Communication: None Disposition Plan:  probable discharge once arrangement has been made by Education officer, museum for transportation to dialysis   Consultants: Nephrology and vascular surgery  Procedures:  echo on 04/01/2017: Study Conclusions  - Left ventricle: The cavity size was normal. There was severe   concentric hypertrophy. Systolic function was vigorous. The   estimated ejection fraction was in the range of 65% to 70%. Wall   motion was normal; there were no regional wall motion   abnormalities. Features are consistent with a pseudonormal left   ventricular filling pattern, with concomitant abnormal relaxation   and increased filling pressure (grade 2 diastolic dysfunction). - Aortic valve: There was mild regurgitation. - Mitral valve: There was mild regurgitation. - Left atrium: The atrium was severely dilated. - Right atrium: The atrium was severely dilated. - Tricuspid valve: There was moderate regurgitation.   left arm fistulogram with angioplasty of venous outflow performed by vascular surgery on 04/03/2017  Antimicrobials: None    Subjective: Patient seen and examined at bedside undergoing dialysis. He is awake but hard of hearing. No overnight fever, nausea or vomiting or shortness of breath.  Objective: Vitals:   04/05/17 1824 04/05/17 2203 04/06/17 0547 04/06/17 0813  BP: (!) 142/76 (!) 135/99 (!) 142/96 136/88  Pulse: 81 82 72 71  Resp:  20 16 14 16   Temp: 98.2  F (36.8 C) 98 F (36.7 C) (!) 97.4 F (36.3 C) 97.9 F (36.6 C)  TempSrc: Oral Oral Oral Oral  SpO2: 100% 100% 100% 100%  Weight:  77.3 kg (170 lb 8 oz)    Height:        Intake/Output Summary (Last 24 hours) at 04/06/17 1223 Last data filed at 04/06/17 0657  Gross per 24 hour  Intake              460 ml  Output                0 ml  Net              460 ml   Filed Weights   04/04/17 1151 04/04/17 2059 04/05/17 2203  Weight: 76.5 kg (168 lb 10.4 oz) 76.2 kg (168 lb) 77.3 kg (170 lb 8 oz)    Examination:  General exam: Appears calm and comfortable  Respiratory system: Bilateral decreased breath sound at bases with some scattered crackles Cardiovascular system: S1 & S2 heard, Rate controlled  Gastrointestinal system: Abdomen is nondistended, soft and nontender. Normal bowel sounds heard. Suprapubic catheter present Extremities: No cyanosis, clubbing; Trace edema present  Data Reviewed: I have personally reviewed following labs and imaging studies  CBC:  Recent Labs Lab 04/02/17 0327 04/03/17 0334 04/04/17 0319 04/05/17 0555 04/06/17 0310  WBC 3.1* 3.6* 4.2 3.7* 4.3  NEUTROABS 1.3*  --  2.4 2.0 1.6*  HGB 8.7* 9.7* 9.8* 10.3* 8.8*  HCT 26.3* 30.6* 31.6* 33.9* 27.7*  MCV 82.7 84.8 84.5 85.4 84.2  PLT 87* 98* 122* 173 202*   Basic Metabolic Panel:  Recent Labs Lab 03/31/17 0752 04/02/17 0327 04/03/17 0334 04/04/17 0319 04/05/17 0555 04/06/17 0310  NA 137 138 137 136 137 137  K 4.1 3.9 4.5 5.2* 3.8 4.4  CL 100* 100* 100* 97* 94* 97*  CO2 24 25 26 27 30 29   GLUCOSE 85 79 84 87 93 91  BUN 50* 61* 36* 53* 32* 50*  CREATININE 14.29* 14.00* 10.05* 12.62* 9.58* 12.30*  CALCIUM 8.1* 8.1* 8.9 8.9 9.6 9.0  MG  --  2.2  --  2.6* 2.3 2.4  PHOS 6.1*  --   --   --   --   --    GFR: Estimated Creatinine Clearance: 8 mL/min (A) (by C-G formula based on SCr of 12.3 mg/dL (H)). Liver Function Tests:  Recent Labs Lab 03/31/17 0752  ALBUMIN 3.3*   No results for  input(s): LIPASE, AMYLASE in the last 168 hours. No results for input(s): AMMONIA in the last 168 hours. Coagulation Profile:  Recent Labs Lab 04/03/17 0334  INR 0.98   Cardiac Enzymes: No results for input(s): CKTOTAL, CKMB, CKMBINDEX, TROPONINI in the last 168 hours. BNP (last 3 results) No results for input(s): PROBNP in the last 8760 hours. HbA1C: No results for input(s): HGBA1C in the last 72 hours. CBG: No results for input(s): GLUCAP in the last 168 hours. Lipid Profile: No results for input(s): CHOL, HDL, LDLCALC, TRIG, CHOLHDL, LDLDIRECT in the last 72 hours. Thyroid Function Tests: No results for input(s): TSH, T4TOTAL, FREET4, T3FREE, THYROIDAB in the last 72 hours. Anemia Panel: No results for input(s): VITAMINB12, FOLATE, FERRITIN, TIBC, IRON, RETICCTPCT in the last 72 hours. Sepsis Labs: No results for input(s): PROCALCITON, LATICACIDVEN in the last 168 hours.  Recent Results (from the past 240 hour(s))  MRSA PCR Screening     Status:  None   Collection Time: 03/29/17  2:40 AM  Result Value Ref Range Status   MRSA by PCR NEGATIVE NEGATIVE Final    Comment:        The GeneXpert MRSA Assay (FDA approved for NASAL specimens only), is one component of a comprehensive MRSA colonization surveillance program. It is not intended to diagnose MRSA infection nor to guide or monitor treatment for MRSA infections.          Radiology Studies: No results found.      Scheduled Meds: . amLODipine  10 mg Oral QHS  . cinacalcet  30 mg Oral Once per day on Mon Wed Fri  . cloNIDine  0.3 mg Oral TID  . darbepoetin (ARANESP) injection - DIALYSIS  100 mcg Intravenous Q Sat-HD  . doxazosin  4 mg Oral QHS  . furosemide  40 mg Oral Daily  . heparin  5,000 Units Subcutaneous Q8H  . hydrALAZINE  100 mg Oral TID  . irbesartan  300 mg Oral Daily  . metoprolol succinate  50 mg Oral QHS  . multivitamin  1 tablet Oral QHS  . pantoprazole  40 mg Oral Daily  .  pneumococcal 23 valent vaccine  0.5 mL Intramuscular Tomorrow-1000  . sevelamer carbonate  1,600 mg Oral TID WC  . sodium chloride flush  3 mL Intravenous Q12H   Continuous Infusions: . sodium chloride       LOS: 8 days        Aline August, MD [facility] Pager [phone]  If 7PM-7AM, please contact night-coverage [email or web address] Password [facility] 04/06/2017, 12:23 PM

## 2017-04-06 NOTE — Care Management Important Message (Signed)
Important Message  Patient Details  Name: John Woodard MRN: [id number] Date of Birth: Aug 17, 1970   Medicare Important Message Given:  Yes    Nathen May 04/06/2017, 10:33 AM

## 2017-04-06 NOTE — Progress Notes (Signed)
  [facility] Progress Note   Assessment/ Plan:    1.  Hyperkalemia/Uremia D/T missed HD. Resolved; Now back on HD schedule.  Will continue TTS.  2.  ESRD -  T,Th,S. Having transportation issues with [facility], lives in [place].  CLIP to local [place] unit in process.  Next HD planned 9/18.   3.  Hypertension/volume  -BP  Stable currently  On multiple antihypertensive meds.  Remains hypertensive, home meds have been resumed.  Using EDW 77kg currently.  4.  Anemia  - Monitor;  Fe 57 Tsat 24%.  On ESA.  5.  Metabolic bone disease -  Ca 8.0. Binders, sensipar resumed. 6.  Nutrition - Albumin 3.9 Renal diet. Add renal vit.  7. Access issues- arterial pressures were quite high.  VVS c/s, appreciate.  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    8. Dispo: when accepted by HD center.   9. Suprapubic Catheter present, [facility] follows  Subjective:    No new issues     Objective:   BP 136/88 (BP Location: Right Arm)   Pulse 71   Temp 97.9 F (36.6 C) (Oral)   Resp 16   Ht 6\' 2"  (1.88 m)   Wt 77.3 kg (170 lb 8 oz)   SpO2 100%   BMI 21.89 kg/m   Physical Exam: Gen:NAD, resting CVS: RRR Resp:normal WOB, CTAB Abd: nondistended Ext: No LEE ACCESS: +B/T  Labs: BMET  Recent Labs Lab 03/31/17 0752 04/02/17 0327 04/03/17 0334 04/04/17 0319 04/05/17 0555 04/06/17 0310  NA 137 138 137 136 137 137  K 4.1 3.9 4.5 5.2* 3.8 4.4  CL 100* 100* 100* 97* 94* 97*  CO2 24 25 26 27 30 29   GLUCOSE 85 79 84 87 93 91  BUN 50* 61* 36* 53* 32* 50*  CREATININE 14.29* 14.00* 10.05* 12.62* 9.58* 12.30*  CALCIUM 8.1* 8.1* 8.9 8.9 9.6 9.0  PHOS 6.1*  --   --   --   --   --    CBC  Recent Labs Lab 04/02/17 0327 04/03/17 0334 04/04/17 0319 04/05/17 0555 04/06/17 0310  WBC 3.1* 3.6* 4.2 3.7* 4.3  NEUTROABS 1.3*  --  2.4 2.0 1.6*  HGB 8.7* 9.7* 9.8* 10.3* 8.8*  HCT 26.3* 30.6* 31.6* 33.9* 27.7*  MCV 82.7 84.8 84.5 85.4 84.2  PLT  87* 98* 122* 173 144*    @IMGRELPRIORS @ Medications:    . amLODipine  10 mg Oral QHS  . cinacalcet  30 mg Oral Once per day on Mon Wed Fri  . cloNIDine  0.3 mg Oral TID  . darbepoetin (ARANESP) injection - DIALYSIS  100 mcg Intravenous Q Sat-HD  . doxazosin  4 mg Oral QHS  . furosemide  40 mg Oral Daily  . heparin  5,000 Units Subcutaneous Q8H  . hydrALAZINE  100 mg Oral TID  . irbesartan  300 mg Oral Daily  . metoprolol succinate  50 mg Oral QHS  . multivitamin  1 tablet Oral QHS  . pantoprazole  40 mg Oral Daily  . pneumococcal 23 valent vaccine  0.5 mL Intramuscular Tomorrow-1000  . sevelamer carbonate  1,600 mg Oral TID WC  . sodium chloride flush  3 mL Intravenous Q12H     Pearson Grippe, MD [facility] 04/06/2017, 9:59 AM

## 2017-04-07 LAB — CBC
HCT: 27.9 % — ABNORMAL LOW (ref 39.0–52.0)
Hemoglobin: 8.9 g/dL — ABNORMAL LOW (ref 13.0–17.0)
MCH: 26.6 pg (ref 26.0–34.0)
MCHC: 31.9 g/dL (ref 30.0–36.0)
MCV: 83.3 fL (ref 78.0–100.0)
PLATELETS: 170 10*3/uL (ref 150–400)
RBC: 3.35 MIL/uL — ABNORMAL LOW (ref 4.22–5.81)
RDW: 14.8 % (ref 11.5–15.5)
WBC: 4 10*3/uL (ref 4.0–10.5)

## 2017-04-07 LAB — RENAL FUNCTION PANEL
ALBUMIN: 3.4 g/dL — AB (ref 3.5–5.0)
Anion gap: 15 (ref 5–15)
BUN: 73 mg/dL — ABNORMAL HIGH (ref 6–20)
CHLORIDE: 95 mmol/L — AB (ref 101–111)
CO2: 25 mmol/L (ref 22–32)
CREATININE: 16.46 mg/dL — AB (ref 0.61–1.24)
Calcium: 8.8 mg/dL — ABNORMAL LOW (ref 8.9–10.3)
GFR calc Af Amer: 3 mL/min — ABNORMAL LOW (ref >60)
GFR, EST NON AFRICAN AMERICAN: 3 mL/min — AB (ref >60)
Glucose, Bld: 75 mg/dL (ref 65–99)
PHOSPHORUS: 4.4 mg/dL (ref 2.5–4.6)
Potassium: 4.8 mmol/L (ref 3.5–5.1)
Sodium: 135 mmol/L (ref 135–145)

## 2017-04-07 MED ORDER — METOPROLOL SUCCINATE ER 50 MG PO TB24: 50.0000 mg | ORAL_TABLET | Freq: Every day | ORAL | 0 refills | Status: AC

## 2017-04-07 MED ORDER — HYDRALAZINE HCL 100 MG PO TABS
100.0000 mg | ORAL_TABLET | Freq: Three times a day (TID) | ORAL | 0 refills | Status: DC
Start: 2017-04-07 — End: 2018-04-05

## 2017-04-07 MED ORDER — CINACALCET HCL 30 MG PO TABS: 30.0000 mg | ORAL_TABLET | ORAL | Status: DC

## 2017-04-07 MED ORDER — SODIUM CHLORIDE 0.9 % IV SOLN
250.0000 mg | INTRAVENOUS | Status: DC
  Administered 2017-04-07: 250 mg via INTRAVENOUS
  Filled 2017-04-07: qty 20

## 2017-04-07 MED ORDER — CLONIDINE HCL 0.3 MG PO TABS: 0.3000 mg | ORAL_TABLET | Freq: Three times a day (TID) | ORAL | 0 refills | Status: AC

## 2017-04-07 NOTE — Discharge Summary (Signed)
Physician Discharge Summary  John Woodard STM:[id number] DOB: 04-Feb-1971 DOA: 03/28/2017  PCP: Default, Provider, MD  Admit date: 03/28/2017 Discharge date: 04/07/2017  Admitted From: Home Disposition:  Home  Recommendations for Outpatient Follow-up:  1. Follow up with PCP in 1 week with repeat CBC/BMP 2. Follow-up with outpatient dialysis as scheduled 3. Comply with medications and follow-up including dialysis   Home Health: No  Equipment/Devices: None  Discharge Condition: Stable  CODE STATUS: Full  Diet recommendation: Heart Healthy / renal hemodialysis diet with fluid restriction up to 1200 mL per day   Brief/Interim Summary: 46 year old male with PMH of hard of hearing, ESRD on TTS HD (Dr. Kriste Basque, Nephrology), HTN, GERD, missed 4 sessions of dialysis due to transportation issues and presented to the ED on 9/8 feeling that he needed dialysis now, found to have potassium of 6.3, bicarbonate 17, BUN 119 and creatinine of 27.6. Hyperkalemia treated with medications and resolved. Nephrology consulted and undergoing serial dialysis. Vascular surgery also consulted and patient underwent left arm fistulogram with angioplasty of venous outflow on 04/03/2017. Blood pressure medications were adjusted because of extremely elevated blood pressure. Blood pressures have improved. Outpatient dialysis center has been arranged for follow-up. Patient will be discharged today.  Discharge Diagnoses:  Principal Problem:   Hyperkalemia Active Problems:   Hypertension   ESRD on dialysis ([place])   Uremia   Pancytopenia (HCC)   GERD (gastroesophageal reflux disease)  1. Hyperkalemia: Due to missed dialysis. Initially treated with medications in ED. Now back on hemodialysis. Resolved. Outpatient follow-up 2. ESRD on GX:QJJHERDEYC follow-up appreciated. Continue hemodialysis as per nephrology schedule and recommendations. Patient had left arm fistulogram with angioplasty of venous outflow performed by  vascular surgery on 04/03/2017. Outpatient dialysis follow-up has been arranged. Discharge patient home today as cleared by nephrology. 3. Essential hypertension: Blood pressure better currently. Continue current doses of amlodipine, doxazosin, Irbesartanand Lasix and metoprolol, hydralazine and clonidine. Outpatient follow-up 4. Pancytopenia: Improving; monitor, no sign of bleed, no fever. Outpatient follow-up 5. GERD:PPI. 6. Paroxysmal Atrial fibrillation: Noted on telemetry and admitting EKG. Not known if this is old or new for him. Continue monitoring on telemetry. Currently rate controlled. May not be a good candidate for long-term anticoagulation due to compliance issues. Echo shows ejection fraction of 65-70% with grade 2 diastolic dysfunction. Outpatient follow-up with primary care provider and/or cardiology regarding  need for anticoagulation 7. Noncompliance: Counseled about compliance to follow up. 8. ? Cognitive issues: As per discussion with brotherby prior hospitalist, mental status is at baseline. 9.    History of suprapubic catheter: Spoke to on call urologist on 04/02/2017 on phone Dr. Alyson Ingles who would arrange for outpatient suprapubic catheter change   Discharge Instructions  Discharge Instructions    Call MD for:  difficulty breathing, headache or visual disturbances    Complete by:  As directed    Call MD for:  extreme fatigue    Complete by:  As directed    Call MD for:  hives    Complete by:  As directed    Call MD for:  persistant dizziness or light-headedness    Complete by:  As directed    Call MD for:  persistant nausea and vomiting    Complete by:  As directed    Call MD for:  severe uncontrolled pain    Complete by:  As directed    Call MD for:  temperature >100.4    Complete by:  As directed    Diet - low  sodium heart healthy    Complete by:  As directed    Discharge instructions    Complete by:  As directed    Renal hemodialysis diet with fluid  restriction upto 1240ml per day   Increase activity slowly    Complete by:  As directed      Allergies as of 04/07/2017      Reactions   Naproxen Other (See Comments)   "heart stopped one time"   Lactose Intolerance (gi) Diarrhea, Other (See Comments)   Blood in stools   Penicillins Other (See Comments)   unknown      Medication List    TAKE these medications   acetaminophen 500 MG tablet Commonly known as:  TYLENOL Take 500 mg by mouth every 6 (six) hours as needed for moderate pain.   amLODipine 10 MG tablet Commonly known as:  NORVASC Take 10 mg by mouth at bedtime.   cinacalcet 30 MG tablet Commonly known as:  SENSIPAR Take 30 mg by mouth every Monday, Wednesday, and Friday.   cloNIDine 0.3 MG tablet Commonly known as:  CATAPRES Take 1 tablet (0.3 mg total) by mouth 3 (three) times daily. What changed:  when to take this   doxazosin 4 MG tablet Commonly known as:  CARDURA Take 4 mg by mouth at bedtime.   esomeprazole 40 MG capsule Commonly known as:  NEXIUM Take 40 mg by mouth daily before breakfast.   furosemide 40 MG tablet Commonly known as:  LASIX Take 40 mg by mouth daily.   hydrALAZINE 100 MG tablet Commonly known as:  APRESOLINE Take 1 tablet (100 mg total) by mouth 3 (three) times daily. What changed:  medication strength  how much to take   losartan 100 MG tablet Commonly known as:  COZAAR Take 100 mg by mouth daily.   metoprolol succinate 50 MG 24 hr tablet Commonly known as:  TOPROL-XL Take 1 tablet (50 mg total) by mouth at bedtime. Take with or immediately following a meal. What changed:  medication strength  how much to take  additional instructions   multivitamin Tabs tablet Take 1 tablet by mouth daily.   sevelamer carbonate 800 MG tablet Commonly known as:  RENVELA Take 800-1,600 mg by mouth See admin instructions. 1600mg  three times daily with meals and 800mg  with one snack.            Discharge Care Instructions         Start     Ordered   04/07/17 0000  cloNIDine (CATAPRES) 0.3 MG tablet  3 times daily     04/07/17 1452   04/07/17 0000  hydrALAZINE (APRESOLINE) 100 MG tablet  3 times daily     04/07/17 1452   04/07/17 0000  metoprolol succinate (TOPROL-XL) 50 MG 24 hr tablet  Daily at bedtime     04/07/17 1452   04/07/17 0000  Increase activity slowly     04/07/17 1452   04/07/17 0000  Diet - low sodium heart healthy     04/07/17 1452   04/07/17 0000  Discharge instructions    Comments:  Renal hemodialysis diet with fluid restriction upto 1266ml per day   04/07/17 1452   04/07/17 0000  Call MD for:  temperature >100.4     04/07/17 1452   04/07/17 0000  Call MD for:  persistant nausea and vomiting     04/07/17 1452   04/07/17 0000  Call MD for:  severe uncontrolled pain     04/07/17 1452  04/07/17 0000  Call MD for:  difficulty breathing, headache or visual disturbances     04/07/17 1452   04/07/17 0000  Call MD for:  hives     04/07/17 1452   04/07/17 0000  Call MD for:  persistant dizziness or light-headedness     04/07/17 1452   04/07/17 0000  Call MD for:  extreme fatigue     04/07/17 1452     Follow-up Information    Rexene Agent, MD Follow up in 1 week(s).   Specialty:  Nephrology Why:  Follow up with outpatient HD as scheduled Contact information: [place] [address] [phone]          Allergies  Allergen Reactions  . Naproxen Other (See Comments)    "heart stopped one time"  . Lactose Intolerance (Gi) Diarrhea and Other (See Comments)    Blood in stools  . Penicillins Other (See Comments)    unknown    Consultations:  Nephrology and vascular surgery   Procedures/Studies: Dg Chest 2 View  Result Date: 03/28/2017 CLINICAL DATA:  46 year old male in need of dialysis. Evaluate for potential pulmonary edema. EXAM: CHEST  2 VIEW COMPARISON:  Chest x-ray 10/30/2016. FINDINGS: There is mild cephalization of the pulmonary vasculature and  slight indistinctness of the interstitial markings suggestive of mild pulmonary edema. No pleural effusions. Mild cardiomegaly. Upper mediastinal contours are within normal limits. IMPRESSION: 1. Mild interstitial pulmonary edema. 2. Mild cardiomegaly. Electronically Signed   By: Vinnie Langton M.D.   On: 03/28/2017 19:31    echo on 04/01/2017: Study Conclusions  - Left ventricle: The cavity size was normal. There was severe concentric hypertrophy. Systolic function was vigorous. The estimated ejection fraction was in the range of 65% to 70%. Wall motion was normal; there were no regional wall motion abnormalities. Features are consistent with a pseudonormal left ventricular filling pattern, with concomitant abnormal relaxation and increased filling pressure (grade 2 diastolic dysfunction). - Aortic valve: There was mild regurgitation. - Mitral valve: There was mild regurgitation. - Left atrium: The atrium was severely dilated. - Right atrium: The atrium was severely dilated. - Tricuspid valve: There was moderate regurgitation.   left arm fistulogram with angioplasty of venous outflow performed by vascular surgery on 04/03/2017   Subjective: Patient seen and examined at bedside undergoing dialysis. He is awake but hard of hearing. No overnight fever, nausea or vomiting or shortness of breath.  Discharge Exam: Vitals:   04/07/17 1430 04/07/17 1445  BP: (!) 162/84 (!) 148/89  Pulse: 86 87  Resp:    Temp:    SpO2:     Vitals:   04/07/17 1345 04/07/17 1415 04/07/17 1430 04/07/17 1445  BP: (!) 162/97 (!) 148/108 (!) 162/84 (!) 148/89  Pulse: 78 73 86 87  Resp:      Temp:      TempSrc:      SpO2:      Weight:      Height:         General exam: Appears calm and comfortable  Respiratory system: Bilateral decreased breath sound at bases with some scattered crackles Cardiovascular system: S1 & S2 heard, Rate controlled     The results of significant  diagnostics from this hospitalization (including imaging, microbiology, ancillary and laboratory) are listed below for reference.     Microbiology: Recent Results (from the past 240 hour(s))  MRSA PCR Screening     Status: None   Collection Time: 03/29/17  2:40 AM  Result Value Ref Range Status   MRSA by PCR NEGATIVE NEGATIVE Final    Comment:        The GeneXpert MRSA Assay (FDA approved for NASAL specimens only), is one component of a comprehensive MRSA colonization surveillance program. It is not intended to diagnose MRSA infection nor to guide or monitor treatment for MRSA infections.      Labs: BNP (last 3 results) No results for input(s): BNP in the last 8760 hours. Basic Metabolic Panel:  Recent Labs Lab 04/02/17 0327 04/03/17 0334 04/04/17 0319 04/05/17 0555 04/06/17 0310 04/07/17 1239  NA 138 137 136 137 137 135  K 3.9 4.5 5.2* 3.8 4.4 4.8  CL 100* 100* 97* 94* 97* 95*  CO2 25 26 27 30 29 25   GLUCOSE 79 84 87 93 91 75  BUN 61* 36* 53* 32* 50* 73*  CREATININE 14.00* 10.05* 12.62* 9.58* 12.30* 16.46*  CALCIUM 8.1* 8.9 8.9 9.6 9.0 8.8*  MG 2.2  --  2.6* 2.3 2.4  --   PHOS  --   --   --   --   --  4.4   Liver Function Tests:  Recent Labs Lab 04/07/17 1239  ALBUMIN 3.4*   No results for input(s): LIPASE, AMYLASE in the last 168 hours. No results for input(s): AMMONIA in the last 168 hours. CBC:  Recent Labs Lab 04/02/17 0327 04/03/17 0334 04/04/17 0319 04/05/17 0555 04/06/17 0310 04/07/17 1238  WBC 3.1* 3.6* 4.2 3.7* 4.3 4.0  NEUTROABS 1.3*  --  2.4 2.0 1.6*  --   HGB 8.7* 9.7* 9.8* 10.3* 8.8* 8.9*  HCT 26.3* 30.6* 31.6* 33.9* 27.7* 27.9*  MCV 82.7 84.8 84.5 85.4 84.2 83.3  PLT 87* 98* 122* 173 144* 170   Cardiac Enzymes: No results for input(s): CKTOTAL, CKMB, CKMBINDEX, TROPONINI in the last 168 hours. BNP: Invalid input(s): POCBNP CBG: No results for input(s): GLUCAP in the last 168 hours. D-Dimer No results for input(s): DDIMER in  the last 72 hours. Hgb A1c No results for input(s): HGBA1C in the last 72 hours. Lipid Profile No results for input(s): CHOL, HDL, LDLCALC, TRIG, CHOLHDL, LDLDIRECT in the last 72 hours. Thyroid function studies No results for input(s): TSH, T4TOTAL, T3FREE, THYROIDAB in the last 72 hours.  Invalid input(s): FREET3 Anemia work up No results for input(s): VITAMINB12, FOLATE, FERRITIN, TIBC, IRON, RETICCTPCT in the last 72 hours. Urinalysis    Component Value Date/Time   COLORURINE YELLOW 10/01/2007 2150   APPEARANCEUR CLOUDY (A) 10/01/2007 2150   LABSPEC 1.014 10/01/2007 2150   PHURINE 8.0 10/01/2007 2150   GLUCOSEU NEGATIVE 10/01/2007 2150   HGBUR MODERATE (A) 10/01/2007 2150   BILIRUBINUR NEGATIVE 10/01/2007 2150   KETONESUR NEGATIVE 10/01/2007 2150   PROTEINUR >300 (A) 10/01/2007 2150   UROBILINOGEN 0.2 10/01/2007 2150   NITRITE NEGATIVE 10/01/2007 2150   LEUKOCYTESUR LARGE (A) 10/01/2007 2150   Sepsis Labs Invalid input(s): PROCALCITONIN,  WBC,  LACTICIDVEN Microbiology Recent Results (from the past 240 hour(s))  MRSA PCR Screening     Status: None   Collection Time: 03/29/17  2:40 AM  Result Value Ref Range Status   MRSA by PCR NEGATIVE NEGATIVE Final    Comment:        The GeneXpert MRSA Assay (FDA approved for NASAL specimens only), is one component of a comprehensive MRSA colonization surveillance program. It is not intended to diagnose MRSA infection nor to guide or monitor treatment for MRSA infections.      Time coordinating  discharge: 35 minutes  SIGNED:   Aline August, MD  [facility] 04/07/2017, 2:57 PM Pager: [phone]  If 7PM-7AM, please contact night-coverage [email or web address] Password [email or web address]

## 2017-04-07 NOTE — Progress Notes (Signed)
  [facility] Progress Note   Assessment/ Plan:    1.  Hyperkalemia/Uremia D/T missed HD. Resolved; Now back on HD schedule.  Will continue TTS.  2.  ESRD -  T,Th,S. Having transportation issues with [facility], lives in [place].  CLIP to local [place] unit in process.  Next HD planned today.   3.  Hypertension/volume  -BP  Stable currently  On multiple antihypertensive meds.  Home meds have been resumed.  Using EDW 77kg currently.  4.  Anemia  - Monitor;  Fe 57 Tsat 24%.  On ESA. Start IV Fe.  5.  Metabolic bone disease -  Ca 8.0. Binders, sensipar resumed. 6.  Nutrition - Albumin 3.9 Renal diet. Add renal vit.  7. Access issues- arterial pressures were quite high.  VVS c/s, appreciate.  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    8. Dispo: when accepted by HD center.   9. Suprapubic Catheter present, [facility] follows  Subjective:    No new issues, pt w/o c/o. For HD today      Objective:   BP 140/90 (BP Location: Right Arm)   Pulse 65   Temp 98.8 F (37.1 C) (Oral)   Resp 18   Ht 6\' 2"  (1.88 m)   Wt 77.4 kg (170 lb 10.6 oz)   SpO2 100%   BMI 21.91 kg/m   Physical Exam: Gen:NAD, resting CVS: RRR Resp:normal WOB, CTAB Abd: nondistended Ext: No LEE ACCESS: +B/T  Labs: BMET  Recent Labs Lab 04/02/17 0327 04/03/17 0334 04/04/17 0319 04/05/17 0555 04/06/17 0310  NA 138 137 136 137 137  K 3.9 4.5 5.2* 3.8 4.4  CL 100* 100* 97* 94* 97*  CO2 25 26 27 30 29   GLUCOSE 79 84 87 93 91  BUN 61* 36* 53* 32* 50*  CREATININE 14.00* 10.05* 12.62* 9.58* 12.30*  CALCIUM 8.1* 8.9 8.9 9.6 9.0   CBC  Recent Labs Lab 04/02/17 0327 04/03/17 0334 04/04/17 0319 04/05/17 0555 04/06/17 0310  WBC 3.1* 3.6* 4.2 3.7* 4.3  NEUTROABS 1.3*  --  2.4 2.0 1.6*  HGB 8.7* 9.7* 9.8* 10.3* 8.8*  HCT 26.3* 30.6* 31.6* 33.9* 27.7*  MCV 82.7 84.8 84.5 85.4 84.2  PLT 87* 98* 122* 173 144*    @IMGRELPRIORS @ Medications:    .  amLODipine  10 mg Oral QHS  . cinacalcet  30 mg Oral Once per day on Tue Thu Sat  . cloNIDine  0.3 mg Oral TID  . darbepoetin (ARANESP) injection - DIALYSIS  100 mcg Intravenous Q Sat-HD  . doxazosin  4 mg Oral QHS  . furosemide  40 mg Oral Daily  . heparin  5,000 Units Subcutaneous Q8H  . hydrALAZINE  100 mg Oral TID  . irbesartan  300 mg Oral Daily  . metoprolol succinate  50 mg Oral QHS  . multivitamin  1 tablet Oral QHS  . pantoprazole  40 mg Oral Daily  . pneumococcal 23 valent vaccine  0.5 mL Intramuscular Tomorrow-1000  . sevelamer carbonate  1,600 mg Oral TID WC  . sodium chloride flush  3 mL Intravenous Q12H     Pearson Grippe, MD [facility] 04/07/2017, 11:49 AM

## 2017-04-07 NOTE — Progress Notes (Signed)
Pt accepted To [facility], TTS 2nd Shift. STart 9/20.  After HD today, ok for DC. RS

## 2017-04-07 NOTE — Clinical Social Work Note (Signed)
Patient set too discharge home today. He has been set up at [facility], TTS, 2nd shift but was not provided with paperwork regarding what time he is to report. Visited with patient and he did not have any paperwork regarding his dialyses in [place]. Call made to [facility] dialysis center by nurse (after 5 pm) and no one was available to assist. CSW called [facility] and was asked to call back tomorrow to speak with someone. Attending MD contacted by patient's nurse and advised that patient did not have any information regarding his new dialysis center and schedule. Patient will not discharge this evening and CSW will follow-up with HD staff on Wednesday and talk with patient and brother regarding dialysis transportation.  Shruti Arrey Givens, MSW, LCSW Licensed Clinical Social Worker [place] [phone]

## 2017-04-07 NOTE — Evaluation (Signed)
Physical Therapy Evaluation Patient Details Name: John Woodard MRN: [id number] DOB: [birth year] Today's Date: 04/07/2017   History of Present Illness   John Woodard is a 46 y.o. male with medical history significant of hypertension, GERD, ESRD on dialysis, poor hearing, who presents with the need for dialysis. He has missed last 4 HD sessions due to transportation issues so presented to hospital.   Clinical Impression  Patient evaluated by Physical Therapy with no further acute PT needs identified. All education has been completed and the patient has no further questions. Pt mod I with use of RW. Encouraged to ambulate frequently with RW in hallway. Very hard to communicate with him due to hearing loss and he perseverates on several topics throughout session making me question baseline mental status.  See below for any follow-up Physical Therapy or equipment needs. PT is signing off. Thank you for this referral.     Follow Up Recommendations No PT follow up    Equipment Recommendations  None recommended by PT    Recommendations for Other Services       Precautions / Restrictions Precautions Precautions: None Restrictions Weight Bearing Restrictions: No      Mobility  Bed Mobility Overal bed mobility: Independent                Transfers Overall transfer level: Independent Equipment used: None                Ambulation/Gait Ambulation/Gait assistance: Supervision;Modified independent (Device/Increase time) Ambulation Distance (Feet): 300 Feet Assistive device: Rolling walker (2 wheeled);None Gait Pattern/deviations: Step-through pattern Gait velocity: 0.99 ft/sec Gait velocity interpretation: <1.8 ft/sec, indicative of risk for recurrent falls General Gait Details: decreased pace and min-guard given when pt without AD as he seemed a bit unsteady and gait was very slow. Mod I with RW though gait speed still decreased significantly, seems due to generalized  weakness  Stairs Stairs: Yes Stairs assistance: Supervision Stair Management: One rail Right;Alternating pattern;Forwards Number of Stairs: 10 General stair comments: used R raill up, both rails down, slow pace with increased effort through hip and knee extension during ascent.   Wheelchair Mobility    Modified Rankin (Stroke Patients Only)       Balance Overall balance assessment: Needs assistance Sitting-balance support: No upper extremity supported Sitting balance-Leahy Scale: Normal     Standing balance support: No upper extremity supported Standing balance-Leahy Scale: Good Standing balance comment: mild limitations evident, very stable when using RW                             Pertinent Vitals/Pain Pain Assessment: No/denies pain    Home Living Family/patient expects to be discharged to:: Private residence Living Arrangements: Other relatives Available Help at Discharge: Family;Available PRN/intermittently Type of Home: House Home Access: Stairs to enter   Entrance Stairs-Number of Steps: 7 Home Layout: One level Home Equipment: Walker - 2 wheels Additional Comments: uses an umbrella as a cane.     Prior Function Level of Independence: Independent with assistive device(s)         Comments: pt reports independence. Very difficult to communicate with him due to profound hearing loss     Hand Dominance        Extremity/Trunk Assessment   Upper Extremity Assessment Upper Extremity Assessment: LUE deficits/detail LUE Deficits / Details: fistula    Lower Extremity Assessment Lower Extremity Assessment: Generalized weakness;RLE deficits/detail RLE Deficits / Details: unable to perform MMT  because pt cannot hear to follow commands but LE generalized weakness noted with effort in LE's when climbing stairs    Cervical / Trunk Assessment Cervical / Trunk Assessment: Normal  Communication   Communication: HOH  Cognition Arousal/Alertness:  Awake/alert Behavior During Therapy: WFL for tasks assessed/performed Overall Cognitive Status: No family/caregiver present to determine baseline cognitive functioning                                 General Comments: difficult to assess due to hearing loss but pt perseverates on certain topics throughout session, including his distaste for current home and his insistence that he told his siblings it would not be a good place to live. Expect he may have some baseline cognitive deficts.       General Comments General comments (skin integrity, edema, etc.): O2 sats 97% on RA, HR 97 bpm.  Was able to relay to pt that PT would not see him again but that he was to ambulate in hallway as tolerated with RW. Pt verbalized instructions.     Exercises     Assessment/Plan    PT Assessment Patent does not need any further PT services  PT Problem List         PT Treatment Interventions      PT Goals (Current goals can be found in the Care Plan section)  Acute Rehab PT Goals Patient Stated Goal: none stated PT Goal Formulation: All assessment and education complete, DC therapy    Frequency     Barriers to discharge        Co-evaluation               AM-PAC PT "6 Clicks" Daily Activity  Outcome Measure Difficulty turning over in bed (including adjusting bedclothes, sheets and blankets)?: None Difficulty moving from lying on back to sitting on the side of the bed? : None Difficulty sitting down on and standing up from a chair with arms (e.g., wheelchair, bedside commode, etc,.)?: None Help needed moving to and from a bed to chair (including a wheelchair)?: None Help needed walking in hospital room?: None Help needed climbing 3-5 steps with a railing? : A Little 6 Click Score: 23    End of Session Equipment Utilized During Treatment: Gait belt Activity Tolerance: Patient tolerated treatment well Patient left: in bed;with call bell/phone within reach Nurse  Communication: Mobility status PT Visit Diagnosis: Muscle weakness (generalized) (M62.81)    Time: 9604-5409 PT Time Calculation (min) (ACUTE ONLY): 15 min   Charges:   PT Evaluation $PT Eval Low Complexity: 1 Low     PT G Codes:        Leighton Roach, PT  Acute Rehab Services  [place] 04/07/2017, 10:07 AM

## 2017-04-07 NOTE — Progress Notes (Signed)
Patient ID: John Woodard, male   DOB: 1971-01-11, 46 y.o.   MRN: [id number]  PROGRESS NOTE    John Woodard  KGM:[id number] DOB: Feb 07, 1971 DOA: 03/28/2017 PCP: Default, Provider, MD   Brief Narrative:  46 year old male with PMH of hard of hearing, ESRD on TTS HD (Dr. Kriste Basque, Nephrology), HTN, GERD, missed 4 sessions of dialysis due to transportation issues and presented to the ED on 9/8 feeling that he needed dialysis now, found to have potassium of 6.3, bicarbonate 17, BUN 119 and creatinine of 27.6. Hyperkalemia treated with medications and resolved. Nephrology consulted and undergoing serial dialysis. Vascular surgery also consulted.  Assessment & Plan:   Principal Problem:   Hyperkalemia Active Problems:   Hypertension   ESRD on dialysis ([place])   Uremia   Pancytopenia (HCC)   GERD (gastroesophageal reflux disease)   1. Hyperkalemia: Due to missed dialysis. Initially treated with medications in ED. Now back on hemodialysis. Resolved. Repeat a.m. labs 2. ESRD on UY:[id number] follow-up appreciated. Continue hemodialysis as per nephrology schedule and recommendations. Patient had left arm fistulogram with angioplasty of venous outflow performed by vascular surgery on 04/03/2017 3. Essential hypertension: Blood pressure better currently. Continue current doses of amlodipine, doxazosin, Irbesartan and Lasix and metoprolol, hydralazine and clonidine. Follow nephrology recommendations  4. Pancytopenia: Improving; monitor, no sign of bleed, no fever. Repeat a.m. labs 5. GERD:PPI. 6. Paroxysmal Atrial fibrillation: Noted on telemetry and admitting EKG. Not known if this is old or new for him. Continue monitoring on telemetry. Currently rate controlled. May not be a good candidate for long-term anticoagulation due to compliance issues. Echo shows ejection fraction of 65-70% with grade 2 diastolic dysfunction. Outpatient follow-up with primary care provider and/or cardiology regarding a need  for anticoagulation 7. Noncompliance: Social issues and unable to get to HD. Follow up with social worker regarding arrangement for transportation to dialysis.  8. ? Cognitive issues: As per discussion with brother by prior hospitalist, mental status is at baseline. History of suprapubic catheter: Spoke to on call urologist on 04/02/2017 on phone Dr. Alyson Ingles who would arrange for outpatient suprapubic catheter change   DVT prophylaxis: Subcutaneous heparin Code Status:  Full Family Communication: None Disposition Plan:  probable discharge once outpatient dialysis and transportation to dialysis arrangement has been made by social worker   Consultants: Nephrology and vascular surgery  Procedures:  echo on 04/01/2017: Study Conclusions  - Left ventricle: The cavity size was normal. There was severe   concentric hypertrophy. Systolic function was vigorous. The   estimated ejection fraction was in the range of 65% to 70%. Wall   motion was normal; there were no regional wall motion   abnormalities. Features are consistent with a pseudonormal left   ventricular filling pattern, with concomitant abnormal relaxation   and increased filling pressure (grade 2 diastolic dysfunction). - Aortic valve: There was mild regurgitation. - Mitral valve: There was mild regurgitation. - Left atrium: The atrium was severely dilated. - Right atrium: The atrium was severely dilated. - Tricuspid valve: There was moderate regurgitation.   left arm fistulogram with angioplasty of venous outflow performed by vascular surgery on 04/03/2017  Antimicrobials: None    Subjective: Patient seen and examined at bedside undergoing dialysis. He is awake but hard of hearing. No overnight fever, nausea or vomiting or shortness of breath.  Objective: Vitals:   04/06/17 0547 04/06/17 0813 04/06/17 1900 04/07/17 1146  BP: (!) 142/96 136/88 121/78 140/90  Pulse: 72 71 78 65  Resp: 14 16 17  18  Temp: (!) 97.4  F (36.3 C) 97.9 F (36.6 C) 97.8 F (36.6 C) 98.8 F (37.1 C)  TempSrc: Oral Oral Oral Oral  SpO2: 100% 100% 100% 100%  Weight:   77.4 kg (170 lb 10.6 oz)   Height:        Intake/Output Summary (Last 24 hours) at 04/07/17 1246 Last data filed at 04/07/17 1026  Gross per 24 hour  Intake              120 ml  Output                0 ml  Net              120 ml   Filed Weights   04/04/17 2059 04/05/17 2203 04/06/17 1900  Weight: 76.2 kg (168 lb) 77.3 kg (170 lb 8 oz) 77.4 kg (170 lb 10.6 oz)    Examination:  General exam: Appears calm and comfortable  Respiratory system: Bilateral decreased breath sound at bases with some scattered crackles Cardiovascular system: S1 & S2 heard, Rate controlled   Data Reviewed: I have personally reviewed following labs and imaging studies  CBC:  Recent Labs Lab 04/02/17 0327 04/03/17 0334 04/04/17 0319 04/05/17 0555 04/06/17 0310  WBC 3.1* 3.6* 4.2 3.7* 4.3  NEUTROABS 1.3*  --  2.4 2.0 1.6*  HGB 8.7* 9.7* 9.8* 10.3* 8.8*  HCT 26.3* 30.6* 31.6* 33.9* 27.7*  MCV 82.7 84.8 84.5 85.4 84.2  PLT 87* 98* 122* 173 867*   Basic Metabolic Panel:  Recent Labs Lab 04/02/17 0327 04/03/17 0334 04/04/17 0319 04/05/17 0555 04/06/17 0310  NA 138 137 136 137 137  K 3.9 4.5 5.2* 3.8 4.4  CL 100* 100* 97* 94* 97*  CO2 25 26 27 30 29   GLUCOSE 79 84 87 93 91  BUN 61* 36* 53* 32* 50*  CREATININE 14.00* 10.05* 12.62* 9.58* 12.30*  CALCIUM 8.1* 8.9 8.9 9.6 9.0  MG 2.2  --  2.6* 2.3 2.4   GFR: Estimated Creatinine Clearance: 8 mL/min (A) (by C-G formula based on SCr of 12.3 mg/dL (H)). Liver Function Tests: No results for input(s): AST, ALT, ALKPHOS, BILITOT, PROT, ALBUMIN in the last 168 hours. No results for input(s): LIPASE, AMYLASE in the last 168 hours. No results for input(s): AMMONIA in the last 168 hours. Coagulation Profile:  Recent Labs Lab 04/03/17 0334  INR 0.98   Cardiac Enzymes: No results for input(s): CKTOTAL, CKMB,  CKMBINDEX, TROPONINI in the last 168 hours. BNP (last 3 results) No results for input(s): PROBNP in the last 8760 hours. HbA1C: No results for input(s): HGBA1C in the last 72 hours. CBG: No results for input(s): GLUCAP in the last 168 hours. Lipid Profile: No results for input(s): CHOL, HDL, LDLCALC, TRIG, CHOLHDL, LDLDIRECT in the last 72 hours. Thyroid Function Tests: No results for input(s): TSH, T4TOTAL, FREET4, T3FREE, THYROIDAB in the last 72 hours. Anemia Panel: No results for input(s): VITAMINB12, FOLATE, FERRITIN, TIBC, IRON, RETICCTPCT in the last 72 hours. Sepsis Labs: No results for input(s): PROCALCITON, LATICACIDVEN in the last 168 hours.  Recent Results (from the past 240 hour(s))  MRSA PCR Screening     Status: None   Collection Time: 03/29/17  2:40 AM  Result Value Ref Range Status   MRSA by PCR NEGATIVE NEGATIVE Final    Comment:        The GeneXpert MRSA Assay (FDA approved for NASAL specimens only), is one component of a comprehensive MRSA colonization  surveillance program. It is not intended to diagnose MRSA infection nor to guide or monitor treatment for MRSA infections.          Radiology Studies: No results found.      Scheduled Meds: . amLODipine  10 mg Oral QHS  . cinacalcet  30 mg Oral Once per day on Tue Thu Sat  . cloNIDine  0.3 mg Oral TID  . darbepoetin (ARANESP) injection - DIALYSIS  100 mcg Intravenous Q Sat-HD  . doxazosin  4 mg Oral QHS  . furosemide  40 mg Oral Daily  . heparin  5,000 Units Subcutaneous Q8H  . hydrALAZINE  100 mg Oral TID  . irbesartan  300 mg Oral Daily  . metoprolol succinate  50 mg Oral QHS  . multivitamin  1 tablet Oral QHS  . pantoprazole  40 mg Oral Daily  . pneumococcal 23 valent vaccine  0.5 mL Intramuscular Tomorrow-1000  . sevelamer carbonate  1,600 mg Oral TID WC  . sodium chloride flush  3 mL Intravenous Q12H   Continuous Infusions: . sodium chloride    . ferric gluconate  (FERRLECIT/NULECIT) IV       LOS: 9 days        Aline August, MD [facility] Pager [phone]  If 7PM-7AM, please contact night-coverage [email or web address] Password [facility] 04/07/2017, 12:46 PM

## 2017-04-08 DIAGNOSIS — K219 Gastro-esophageal reflux disease without esophagitis: Secondary | ICD-10-CM

## 2017-04-08 LAB — CBC WITH DIFFERENTIAL/PLATELET
BASOS ABS: 0 10*3/uL (ref 0.0–0.1)
Basophils Relative: 0 %
EOS PCT: 3 %
Eosinophils Absolute: 0.1 10*3/uL (ref 0.0–0.7)
HCT: 29.8 % — ABNORMAL LOW (ref 39.0–52.0)
Hemoglobin: 9.4 g/dL — ABNORMAL LOW (ref 13.0–17.0)
LYMPHS PCT: 37 %
Lymphs Abs: 1.7 10*3/uL (ref 0.7–4.0)
MCH: 26.4 pg (ref 26.0–34.0)
MCHC: 31.5 g/dL (ref 30.0–36.0)
MCV: 83.7 fL (ref 78.0–100.0)
Monocytes Absolute: 0.5 10*3/uL (ref 0.1–1.0)
Monocytes Relative: 11 %
Neutro Abs: 2.3 10*3/uL (ref 1.7–7.7)
Neutrophils Relative %: 49 %
PLATELETS: 192 10*3/uL (ref 150–400)
RBC: 3.56 MIL/uL — AB (ref 4.22–5.81)
RDW: 14.8 % (ref 11.5–15.5)
WBC: 4.7 10*3/uL (ref 4.0–10.5)

## 2017-04-08 LAB — BASIC METABOLIC PANEL
ANION GAP: 12 (ref 5–15)
BUN: 32 mg/dL — ABNORMAL HIGH (ref 6–20)
CALCIUM: 9.1 mg/dL (ref 8.9–10.3)
CO2: 28 mmol/L (ref 22–32)
Chloride: 94 mmol/L — ABNORMAL LOW (ref 101–111)
Creatinine, Ser: 10.25 mg/dL — ABNORMAL HIGH (ref 0.61–1.24)
GFR, EST AFRICAN AMERICAN: 6 mL/min — AB (ref >60)
GFR, EST NON AFRICAN AMERICAN: 5 mL/min — AB (ref >60)
GLUCOSE: 77 mg/dL (ref 65–99)
POTASSIUM: 4.5 mmol/L (ref 3.5–5.1)
SODIUM: 134 mmol/L — AB (ref 135–145)

## 2017-04-08 LAB — MAGNESIUM: MAGNESIUM: 2.2 mg/dL (ref 1.7–2.4)

## 2017-04-08 NOTE — Clinical Social Work Note (Addendum)
Contact made with [facility] [phone]) and dialysis transportation set-up for patient. John Woodard has been assigned to [facility] [place] [phone]). His dialysis days are TTS and his arrival time on 9/20 is 12:00 noon and his chair time is 12:55 pm. John Woodard will be picked up at 11:00 am by [facility] transportation.   Brother, John Woodard [phone]) contacted regarding new dialysis location and schedule and informed that patient will have a copy for him. CSW went over dialysis schedule with patient and provided him with 2 copies. Patient recited back to CSW that he will be ready and outside of his apartment before 11:00, waiting for his ride. CSW received a call from SW at [facility] dialysis center and she will assure that transportation is called to take patient home after dialysis. CSW signing off as patient will discharge home today by taxi, as patient has a duffle type bag and several other bags to carry and patient's brother works.  CSW talked with John Woodard, SW at [facility] at 2:49 pm regarding confirming patient's transportation to HD. John Woodard informed CSW that she had confirmed that patient will be transported to center by [employer] transportation. Once his treatment is completed, she will arrange transport home via [employer] and will also adjust his pick-up time with transportation service.   John Woodard, MSW, LCSW Licensed Clinical Social Worker [place] [phone]

## 2017-04-08 NOTE — Progress Notes (Signed)
Patient discharged to home via [facility]. IV removed. All discharge instructions reviewed including prescriptions. All belongings with patient. Patient left unit in stable condition.  Sheliah Plane RN

## 2017-04-08 NOTE — Care Management Note (Signed)
Case Management Note  Patient Details  Name: John Woodard MRN: [id number] Date of Birth: 1971-04-03  Subjective/Objective:    Hyperkalemia, HTN, ESRD on HD                Action/Plan: Discharge Planning: Chart reviewed. NCM spoke to pt and lives at home with his brother. CSW following to assist with transportation to HD. CSW will follow up with brother to discuss his HD schedule and location.    Expected Discharge Date:  04/08/17               Expected Discharge Plan:  Home/Self Care  In-House Referral:  Clinical Social Work  Discharge planning Services  CM Consult  Post Acute Care Choice:  NA Choice offered to:  NA  DME Arranged:  N/A DME Agency:  NA  HH Arranged:  NA HH Agency:  NA  Status of Service:  Completed, signed off  If discussed at [employer] of Stay Meetings, dates discussed:    Additional Comments:  Erenest Rasher, RN 04/08/2017, 2:18 PM

## 2017-04-08 NOTE — Progress Notes (Signed)
PROGRESS NOTE    John Woodard  DZH:[id number] DOB: 03-Aug-1970 DOA: 03/28/2017 PCP: Default, Provider, MD    Brief Narrative:  46 year old male with PMH of hard of hearing, ESRD on TTS HD (Dr. Kriste Basque, Nephrology), HTN, GERD, missed 4 sessions of dialysis due to transportation issues and presented to the ED on 9/8 feeling that he needed dialysis now, found to have potassium of 6.3, bicarbonate 17, BUN 119 and creatinine of 27.6. Hyperkalemia treated with medications and resolved. Nephrology consulted and undergoing serial dialysis. Vascular surgery also consulted.   Assessment & Plan:   Principal Problem:   Hyperkalemia Active Problems:   Hypertension   ESRD on dialysis ([place])   Uremia   Pancytopenia (HCC)   GERD (gastroesophageal reflux disease)   1. Hyperkalemia: Due to missed dialysis. Initially treated with medications in ED. Now back on hemodialysis. Resolved. 2. ESRD on MH:[id number] follow-up appreciated. Continue hemodialysis as per nephrology schedule and recommendations. Patient had left arm fistulogram with angioplasty of venous outflow performed by vascular surgery on 04/03/2017. Patient has been set up for outpatient hemodialysis. 3. Essential hypertension: Blood pressure better controlled. Continue current regimen of amlodipine, doxazosin, Irbesartanand Lasix and metoprolol, hydralazine and clonidine. Follow nephrology recommendations  4. Pancytopenia: Improved; monitor, no sign of bleed, no fever. 5. GERD:PPI. 6. Paroxysmal Atrial fibrillation: Noted on telemetry and admitting EKG. Not known if this is old or new for him. Currently rate controlled. May not be a good candidate for long-term anticoagulation due to compliance issues. Echo showed ejection fraction of 65-70% with grade 2 diastolic dysfunction. Outpatient follow-up with primary care provider and/or cardiology regarding a need for anticoagulation 7. Noncompliance: Social issues and unable to get to HD.  Patient seen by Education officer, museum and arrangements have been made for transportation to and from hemodialysis.  8. ? Cognitive issues: As per discussion with brotherby prior hospitalist, mental status is at baseline. 9. History of suprapubic catheter: Dr Starla Link spoke to on call urologist on 04/02/2017 on phone Dr. Alyson Ingles who would arrange for outpatient suprapubic catheter change this week.  DVT prophylaxis: Heparin Code Status: Full Family Communication: Updated patient. No family at bedside. Disposition Plan: Discharge home.   Consultants:   Vascular surgery Dr. Donnetta Hutching 04/01/2017  Nephrology: Dr. Florene Glen 03/29/2017  Procedures:   2-D echo 04/01/2017  Antimicrobials:   None   Subjective: Sitting up on side of bed. No chest pain. No shortness of breath.  Objective: Vitals:   04/07/17 1841 04/07/17 2111 04/08/17 0518 04/08/17 0933  BP: (!) 154/94 120/75 (!) 139/93 (!) 131/97  Pulse: 79 81 77 76  Resp: 16 18 17 17   Temp: 98.2 F (36.8 C) 99.1 F (37.3 C) 98.4 F (36.9 C) 98.7 F (37.1 C)  TempSrc: Oral   Oral  SpO2: 100% 100% 100% 100%  Weight:  75.5 kg (166 lb 7.2 oz)    Height:        Intake/Output Summary (Last 24 hours) at 04/08/17 1150 Last data filed at 04/08/17 0932  Gross per 24 hour  Intake              340 ml  Output             3005 ml  Net            -2665 ml   Filed Weights   04/07/17 1230 04/07/17 1636 04/07/17 2111  Weight: 79.1 kg (174 lb 6.1 oz) 75.5 kg (166 lb 7.2 oz) 75.5 kg (166 lb 7.2 oz)  Examination:  General exam: Appears calm and comfortable  Respiratory system: Clear to auscultation. Respiratory effort normal. Cardiovascular system: S1 & S2 heard, RRR. No JVD, murmurs, rubs, gallops or clicks. No pedal edema. Gastrointestinal system: Abdomen is nondistended, soft and nontender. No organomegaly or masses felt. Normal bowel sounds heard. Central nervous system: Alert and oriented. No focal neurological deficits. Extremities:  Symmetric 5 x 5 power. Skin: No rashes, lesions or ulcers Psychiatry: Judgement and insight appear normal. Mood & affect appropriate.     Data Reviewed: I have personally reviewed following labs and imaging studies  CBC:  Recent Labs Lab 04/02/17 0327  04/04/17 0319 04/05/17 0555 04/06/17 0310 04/07/17 1238 04/08/17 0310  WBC 3.1*  < > 4.2 3.7* 4.3 4.0 4.7  NEUTROABS 1.3*  --  2.4 2.0 1.6*  --  2.3  HGB 8.7*  < > 9.8* 10.3* 8.8* 8.9* 9.4*  HCT 26.3*  < > 31.6* 33.9* 27.7* 27.9* 29.8*  MCV 82.7  < > 84.5 85.4 84.2 83.3 83.7  PLT 87*  < > 122* 173 144* 170 192  < > = values in this interval not displayed. Basic Metabolic Panel:  Recent Labs Lab 04/02/17 0327  04/04/17 0319 04/05/17 0555 04/06/17 0310 04/07/17 1239 04/08/17 0310  NA 138  < > 136 137 137 135 134*  K 3.9  < > 5.2* 3.8 4.4 4.8 4.5  CL 100*  < > 97* 94* 97* 95* 94*  CO2 25  < > 27 30 29 25 28   GLUCOSE 79  < > 87 93 91 75 77  BUN 61*  < > 53* 32* 50* 73* 32*  CREATININE 14.00*  < > 12.62* 9.58* 12.30* 16.46* 10.25*  CALCIUM 8.1*  < > 8.9 9.6 9.0 8.8* 9.1  MG 2.2  --  2.6* 2.3 2.4  --  2.2  PHOS  --   --   --   --   --  4.4  --   < > = values in this interval not displayed. GFR: Estimated Creatinine Clearance: 9.4 mL/min (A) (by C-G formula based on SCr of 10.25 mg/dL (H)). Liver Function Tests:  Recent Labs Lab 04/07/17 1239  ALBUMIN 3.4*   No results for input(s): LIPASE, AMYLASE in the last 168 hours. No results for input(s): AMMONIA in the last 168 hours. Coagulation Profile:  Recent Labs Lab 04/03/17 0334  INR 0.98   Cardiac Enzymes: No results for input(s): CKTOTAL, CKMB, CKMBINDEX, TROPONINI in the last 168 hours. BNP (last 3 results) No results for input(s): PROBNP in the last 8760 hours. HbA1C: No results for input(s): HGBA1C in the last 72 hours. CBG: No results for input(s): GLUCAP in the last 168 hours. Lipid Profile: No results for input(s): CHOL, HDL, LDLCALC, TRIG,  CHOLHDL, LDLDIRECT in the last 72 hours. Thyroid Function Tests: No results for input(s): TSH, T4TOTAL, FREET4, T3FREE, THYROIDAB in the last 72 hours. Anemia Panel: No results for input(s): VITAMINB12, FOLATE, FERRITIN, TIBC, IRON, RETICCTPCT in the last 72 hours. Sepsis Labs: No results for input(s): PROCALCITON, LATICACIDVEN in the last 168 hours.  No results found for this or any previous visit (from the past 240 hour(s)).       Radiology Studies: No results found.      Scheduled Meds: . amLODipine  10 mg Oral QHS  . cinacalcet  30 mg Oral Once per day on Tue Thu Sat  . cloNIDine  0.3 mg Oral TID  . darbepoetin (ARANESP) injection - DIALYSIS  100  mcg Intravenous Q Sat-HD  . doxazosin  4 mg Oral QHS  . furosemide  40 mg Oral Daily  . heparin  5,000 Units Subcutaneous Q8H  . hydrALAZINE  100 mg Oral TID  . irbesartan  300 mg Oral Daily  . metoprolol succinate  50 mg Oral QHS  . multivitamin  1 tablet Oral QHS  . pantoprazole  40 mg Oral Daily  . pneumococcal 23 valent vaccine  0.5 mL Intramuscular Tomorrow-1000  . sevelamer carbonate  1,600 mg Oral TID WC  . sodium chloride flush  3 mL Intravenous Q12H   Continuous Infusions: . sodium chloride    . ferric gluconate (FERRLECIT/NULECIT) IV 250 mg (04/07/17 1529)     LOS: 10 days    Time spent: 40 minutes    THOMPSON,DANIEL, MD [facility] Pager [phone]  If 7PM-7AM, please contact night-coverage [email or web address] Password [email or web address] 04/08/2017, 11:50 AM

## 2017-04-09 DIAGNOSIS — D631 Anemia in chronic kidney disease: Secondary | ICD-10-CM | POA: Insufficient documentation

## 2017-04-09 DIAGNOSIS — N189 Chronic kidney disease, unspecified: Secondary | ICD-10-CM | POA: Insufficient documentation

## 2017-04-09 DIAGNOSIS — D689 Coagulation defect, unspecified: Secondary | ICD-10-CM | POA: Insufficient documentation

## 2017-04-09 DIAGNOSIS — N2581 Secondary hyperparathyroidism of renal origin: Secondary | ICD-10-CM | POA: Insufficient documentation

## 2017-04-28 DIAGNOSIS — Z23 Encounter for immunization: Secondary | ICD-10-CM | POA: Insufficient documentation

## 2017-06-22 ENCOUNTER — Ambulatory Visit (INDEPENDENT_AMBULATORY_CARE_PROVIDER_SITE_OTHER): Payer: Medicare Other | Admitting: Surgery

## 2017-06-22 ENCOUNTER — Encounter: Payer: Self-pay | Admitting: Surgery

## 2017-06-22 ENCOUNTER — Encounter: Payer: Self-pay | Admitting: *Deleted

## 2017-06-22 VITALS — BP 109/74 | HR 75 | Temp 97.2°F | Resp 20 | Ht 74.0 in | Wt 175.0 lb

## 2017-06-22 DIAGNOSIS — N186 End stage renal disease: Secondary | ICD-10-CM | POA: Diagnosis not present

## 2017-06-22 DIAGNOSIS — Z992 Dependence on renal dialysis: Secondary | ICD-10-CM

## 2017-06-22 NOTE — H&P (View-Only) (Signed)
.                                     [facility] [place]  Patient name: John Woodard MRN: [id number] DOB: [birth year] Sex: male   REASON FOR VISIT:    dialysis  HISOTRY OF PRESENT ILLNESS:    John Woodard is a 46 y.o. male who is here today for evaluation of a ulcer on his left arm fistula.  He recently underwent a fistulogram and was found to have an outflow stenosis which was treated.  He has not had any bleeding.  He has a catheter in place.   PAST MEDICAL HISTORY:   Past Medical History:  Diagnosis Date  . Dialysis patient ([place])   . Dyspnea   . ESRD on dialysis ([place])   . Hypertension   . Renal disorder      FAMILY HISTORY:   Family History  Problem Relation Age of Onset  . Hypertension Mother   . Diabetes Mellitus II Sister     SOCIAL HISTORY:   Social History   Tobacco Use  . Smoking status: Never Smoker  . Smokeless tobacco: Never Used  Substance Use Topics  . Alcohol use: No     ALLERGIES:   Allergies  Allergen Reactions  . Naproxen Other (See Comments)    "heart stopped one time"  . Lactose Intolerance (Gi) Diarrhea and Other (See Comments)    Blood in stools  . Penicillins Other (See Comments)    unknown     CURRENT MEDICATIONS:   Current Outpatient Medications  Medication Sig Dispense Refill  . acetaminophen (TYLENOL) 500 MG tablet Take 500 mg by mouth every 6 (six) hours as needed for moderate pain.    Marland Kitchen amLODipine (NORVASC) 10 MG tablet Take 10 mg by mouth at bedtime.    . cinacalcet (SENSIPAR) 30 MG tablet Take 30 mg by mouth every Monday, Wednesday, and Friday.     . cloNIDine (CATAPRES) 0.3 MG tablet Take 1 tablet (0.3 mg total) by mouth 3 (three) times daily. 90 tablet 0  . doxazosin (CARDURA) 4 MG tablet Take 4 mg by mouth at bedtime.    Marland Kitchen esomeprazole (NEXIUM) 40 MG capsule Take 40 mg by mouth daily before breakfast.    . furosemide (LASIX) 40 MG tablet Take 40 mg by mouth daily.    . hydrALAZINE  (APRESOLINE) 100 MG tablet Take 1 tablet (100 mg total) by mouth 3 (three) times daily. 90 tablet 0  . losartan (COZAAR) 100 MG tablet Take 100 mg by mouth daily.    . metoprolol succinate (TOPROL-XL) 50 MG 24 hr tablet Take 1 tablet (50 mg total) by mouth at bedtime. Take with or immediately following a meal. 30 tablet 0  . multivitamin (RENA-VIT) TABS tablet Take 1 tablet by mouth daily.    . sevelamer carbonate (RENVELA) 800 MG tablet Take 800-1,600 mg by mouth See admin instructions. 1600mg  three times daily with meals and 800mg  with one snack.     No current facility-administered medications for this visit.     REVIEW OF SYSTEMS:   [X]  denotes positive finding, [ ]  denotes negative finding Cardiac  Comments:  Chest pain or chest pressure:    Shortness of breath upon exertion:    Short of breath when lying flat:    Irregular heart rhythm:        Vascular  Pain in calf, thigh, or hip brought on by ambulation:    Pain in feet at night that wakes you up from your sleep:     Blood clot in your veins:    Leg swelling:         Pulmonary    Oxygen at home:    Productive cough:     Wheezing:         Neurologic    Sudden weakness in arms or legs:     Sudden numbness in arms or legs:     Sudden onset of difficulty speaking or slurred speech:    Temporary loss of vision in one eye:     Problems with dizziness:         Gastrointestinal    Blood in stool:     Vomited blood:         Genitourinary    Burning when urinating:     Blood in urine:        Psychiatric    Major depression:         Hematologic    Bleeding problems:    Problems with blood clotting too easily:        Skin    Rashes or ulcers:        Constitutional    Fever or chills:      PHYSICAL EXAM:   Vitals:   06/22/17 1341  BP: 109/74  Pulse: 75  Resp: 20  Temp: (!) 97.2 F (36.2 C)  TempSrc: Oral  SpO2: 100%  Weight: 175 lb (79.4 kg)  Height: 6\' 2"  (1.88 m)    GENERAL: The patient is a  well-nourished male, in no acute distress. The vital signs are documented above. CARDIAC: There is a regular rate and rhythm.  VASCULAR: Eschar over top of left radiocephalic fistula which has a good thrill. PULMONARY: Non-labored respirations MUSCULOSKELETAL: There are no major deformities or cyanosis. NEUROLOGIC: No focal weakness or paresthesias are detected. SKIN: There are no ulcers or rashes noted. PSYCHIATRIC: The patient has a normal affect.  STUDIES:   None  MEDICAL ISSUES:   The patient has an ulcer on his fistula.  He is currently getting dialysis through a catheter.  I have recommended proceeding with plication of his fistula and repair of the necrotic skin overlying his fistula.  At the same time I will also perform a shuntogram given that he has had venoplasty performed in the recent past.  This is been scheduled for Wednesday, December 12    [name], MD [facility] [phone] Pager [phone]

## 2017-06-22 NOTE — Progress Notes (Signed)
.                                     [facility] [place]  Patient name: John Woodard MRN: [id number] DOB: 07-30-1970 Sex: male   REASON FOR VISIT:    dialysis  HISOTRY OF PRESENT ILLNESS:    John Woodard is a 46 y.o. male who is here today for evaluation of a ulcer on his left arm fistula.  He recently underwent a fistulogram and was found to have an outflow stenosis which was treated.  He has not had any bleeding.  He has a catheter in place.   PAST MEDICAL HISTORY:   Past Medical History:  Diagnosis Date  . Dialysis patient ([place])   . Dyspnea   . ESRD on dialysis ([place])   . Hypertension   . Renal disorder      FAMILY HISTORY:   Family History  Problem Relation Age of Onset  . Hypertension Mother   . Diabetes Mellitus II Sister     SOCIAL HISTORY:   Social History   Tobacco Use  . Smoking status: Never Smoker  . Smokeless tobacco: Never Used  Substance Use Topics  . Alcohol use: No     ALLERGIES:   Allergies  Allergen Reactions  . Naproxen Other (See Comments)    "heart stopped one time"  . Lactose Intolerance (Gi) Diarrhea and Other (See Comments)    Blood in stools  . Penicillins Other (See Comments)    unknown     CURRENT MEDICATIONS:   Current Outpatient Medications  Medication Sig Dispense Refill  . acetaminophen (TYLENOL) 500 MG tablet Take 500 mg by mouth every 6 (six) hours as needed for moderate pain.    Marland Kitchen amLODipine (NORVASC) 10 MG tablet Take 10 mg by mouth at bedtime.    . cinacalcet (SENSIPAR) 30 MG tablet Take 30 mg by mouth every Monday, Wednesday, and Friday.     . cloNIDine (CATAPRES) 0.3 MG tablet Take 1 tablet (0.3 mg total) by mouth 3 (three) times daily. 90 tablet 0  . doxazosin (CARDURA) 4 MG tablet Take 4 mg by mouth at bedtime.    Marland Kitchen esomeprazole (NEXIUM) 40 MG capsule Take 40 mg by mouth daily before breakfast.    . furosemide (LASIX) 40 MG tablet Take 40 mg by mouth daily.    . hydrALAZINE  (APRESOLINE) 100 MG tablet Take 1 tablet (100 mg total) by mouth 3 (three) times daily. 90 tablet 0  . losartan (COZAAR) 100 MG tablet Take 100 mg by mouth daily.    . metoprolol succinate (TOPROL-XL) 50 MG 24 hr tablet Take 1 tablet (50 mg total) by mouth at bedtime. Take with or immediately following a meal. 30 tablet 0  . multivitamin (RENA-VIT) TABS tablet Take 1 tablet by mouth daily.    . sevelamer carbonate (RENVELA) 800 MG tablet Take 800-1,600 mg by mouth See admin instructions. 1600mg  three times daily with meals and 800mg  with one snack.     No current facility-administered medications for this visit.     REVIEW OF SYSTEMS:   [X]  denotes positive finding, [ ]  denotes negative finding Cardiac  Comments:  Chest pain or chest pressure:    Shortness of breath upon exertion:    Short of breath when lying flat:    Irregular heart rhythm:        Vascular  Pain in calf, thigh, or hip brought on by ambulation:    Pain in feet at night that wakes you up from your sleep:     Blood clot in your veins:    Leg swelling:         Pulmonary    Oxygen at home:    Productive cough:     Wheezing:         Neurologic    Sudden weakness in arms or legs:     Sudden numbness in arms or legs:     Sudden onset of difficulty speaking or slurred speech:    Temporary loss of vision in one eye:     Problems with dizziness:         Gastrointestinal    Blood in stool:     Vomited blood:         Genitourinary    Burning when urinating:     Blood in urine:        Psychiatric    Major depression:         Hematologic    Bleeding problems:    Problems with blood clotting too easily:        Skin    Rashes or ulcers:        Constitutional    Fever or chills:      PHYSICAL EXAM:   Vitals:   06/22/17 1341  BP: 109/74  Pulse: 75  Resp: 20  Temp: (!) 97.2 F (36.2 C)  TempSrc: Oral  SpO2: 100%  Weight: 175 lb (79.4 kg)  Height: 6\' 2"  (1.88 m)    GENERAL: The patient is a  well-nourished male, in no acute distress. The vital signs are documented above. CARDIAC: There is a regular rate and rhythm.  VASCULAR: Eschar over top of left radiocephalic fistula which has a good thrill. PULMONARY: Non-labored respirations MUSCULOSKELETAL: There are no major deformities or cyanosis. NEUROLOGIC: No focal weakness or paresthesias are detected. SKIN: There are no ulcers or rashes noted. PSYCHIATRIC: The patient has a normal affect.  STUDIES:   None  MEDICAL ISSUES:   The patient has an ulcer on his fistula.  He is currently getting dialysis through a catheter.  I have recommended proceeding with plication of his fistula and repair of the necrotic skin overlying his fistula.  At the same time I will also perform a shuntogram given that he has had venoplasty performed in the recent past.  This is been scheduled for Wednesday, December 12    [name], MD [facility] [phone] Pager [phone]

## 2017-06-23 ENCOUNTER — Other Ambulatory Visit: Payer: Self-pay | Admitting: *Deleted

## 2017-06-29 ENCOUNTER — Encounter (HOSPITAL_COMMUNITY): Payer: Self-pay | Admitting: Emergency Medicine

## 2017-06-29 NOTE — Progress Notes (Signed)
Anesthesia Chart Review:  Pt is a same day work up  Pt is a 46 year old male scheduled for plication of L AV fistula, fistulogram on 07/01/2017 with Harold Barban, MD  PMH includes:  HTN, ESRD on hemodialysis. Never smoker.   - Hospitalized 9/8-18/18 for hyperkalemia after missing 4 dialysis sessions.  Complicated by paroxysmal atrial fibrillation (documented as uncertain if PAF new or established; pt to f/u with PCP or outpt cardiology to be evaluated for anticoagulation (AF rate controlled) but hospitalist felt pt to be poor candidate for anticoagulation due to compliance issues)  Medications include: amlodipine, clonidine, doxazosin, lasix, hydralazine, losartan, metoprolol  Labs will be obtained day of surgery  CXR 03/28/17:  1. Mild interstitial pulmonary edema. 2. Mild cardiomegaly.  EKG 03/28/17: atrial fibrillation (82 bpm). LAFB. Anteroseptal infarct, age undetermined. Peaked T waves in V4  Echo 04/01/17:  - Left ventricle: The cavity size was normal. There was severe concentric hypertrophy. Systolic function was vigorous. The estimated ejection fraction was in the range of 65% to 70%. Wall motion was normal; there were no regional wall motion abnormalities. Features are consistent with a pseudonormal left ventricular filling pattern, with concomitant abnormal relaxation and increased filling pressure (grade 2 diastolic dysfunction). - Aortic valve: There was mild regurgitation. - Mitral valve: There was mild regurgitation. - Left atrium: The atrium was severely dilated. - Right atrium: The atrium was severely dilated. - Tricuspid valve: There was moderate regurgitation.  Reviewed case with Dr. Conrad [place]. If no changes, I anticipate pt can proceed with surgery as scheduled.   Willeen Cass, FNP-BC [facility]/Anesthesiology Phone: [phone] 06/30/2017 1:16 PM

## 2017-06-30 ENCOUNTER — Encounter (HOSPITAL_COMMUNITY): Payer: Self-pay | Admitting: *Deleted

## 2017-06-30 ENCOUNTER — Other Ambulatory Visit: Payer: Self-pay

## 2017-06-30 NOTE — Progress Notes (Addendum)
I spoke to Mr John Woodard at dialysis, he did not answer his cell phone.  Patient is a poor historian, when I ask a question he get on a different subject.  Patient is arranging transportation to the hospital-  Possible will ride [employer]  I instructed patient that he will need someone to drive him home and to be with him for the 1st 24 hours.  Patient denies chest pain or shortness of breath.  Patient did not know who his PCP is, "I havent been established with too many doctors."  John Woodard , SW at [facility] called me,. She was trying to get in touch with John Woodard transportation for patient , she was on hold for 1 hour before she had to leave.  I called patient's brother to see if he could bring patient to the hospital, he will be at work.  I called patient's number- no answer or voice mail.  I notified Becky at [facility].

## 2017-07-01 ENCOUNTER — Encounter (HOSPITAL_COMMUNITY)
Admission: RE | Disposition: A | Discharge status: Other Acute Inpatient Hospital | Payer: Self-pay | Source: Ambulatory Visit | Attending: Surgery

## 2017-07-01 ENCOUNTER — Ambulatory Visit (HOSPITAL_COMMUNITY)
Admission: RE | Admit: 2017-07-01 | Discharge: 2017-07-01 | Disposition: A | Discharge status: Other Acute Inpatient Hospital | Payer: Medicare Other | Source: Ambulatory Visit | Attending: Surgery | Admitting: Surgery

## 2017-07-01 ENCOUNTER — Other Ambulatory Visit: Payer: Self-pay

## 2017-07-01 ENCOUNTER — Emergency Department (HOSPITAL_COMMUNITY)
Admission: EM | Admit: 2017-07-01 | Discharge: 2017-07-01 | Disposition: A | Discharge status: Home / Self Care | Payer: Medicare Other | Attending: Emergency Medicine | Admitting: Emergency Medicine

## 2017-07-01 DIAGNOSIS — F809 Developmental disorder of speech and language, unspecified: Secondary | ICD-10-CM | POA: Diagnosis not present

## 2017-07-01 DIAGNOSIS — Z5309 Procedure and treatment not carried out because of other contraindication: Secondary | ICD-10-CM | POA: Insufficient documentation

## 2017-07-01 DIAGNOSIS — I120 Hypertensive chronic kidney disease with stage 5 chronic kidney disease or end stage renal disease: Secondary | ICD-10-CM | POA: Insufficient documentation

## 2017-07-01 DIAGNOSIS — R4182 Altered mental status, unspecified: Secondary | ICD-10-CM | POA: Diagnosis present

## 2017-07-01 DIAGNOSIS — Z79899 Other long term (current) drug therapy: Secondary | ICD-10-CM | POA: Diagnosis not present

## 2017-07-01 DIAGNOSIS — Z992 Dependence on renal dialysis: Secondary | ICD-10-CM | POA: Insufficient documentation

## 2017-07-01 DIAGNOSIS — N186 End stage renal disease: Secondary | ICD-10-CM | POA: Insufficient documentation

## 2017-07-01 DIAGNOSIS — Z880 Allergy status to penicillin: Secondary | ICD-10-CM | POA: Diagnosis not present

## 2017-07-01 HISTORY — DX: Other specified health status: Z78.9

## 2017-07-01 LAB — CBC WITH DIFFERENTIAL/PLATELET
Basophils Absolute: 0 10*3/uL (ref 0.0–0.1)
Basophils Relative: 1 %
Eosinophils Absolute: 0.1 10*3/uL (ref 0.0–0.7)
Eosinophils Relative: 4 %
HCT: 35 % — ABNORMAL LOW (ref 39.0–52.0)
HEMOGLOBIN: 10.8 g/dL — AB (ref 13.0–17.0)
LYMPHS ABS: 1.4 10*3/uL (ref 0.7–4.0)
LYMPHS PCT: 44 %
MCH: 27.1 pg (ref 26.0–34.0)
MCHC: 30.9 g/dL (ref 30.0–36.0)
MCV: 87.9 fL (ref 78.0–100.0)
Monocytes Absolute: 0.2 10*3/uL (ref 0.1–1.0)
Monocytes Relative: 5 %
NEUTROS PCT: 46 %
Neutro Abs: 1.5 10*3/uL — ABNORMAL LOW (ref 1.7–7.7)
Platelets: 107 10*3/uL — ABNORMAL LOW (ref 150–400)
RBC: 3.98 MIL/uL — AB (ref 4.22–5.81)
RDW: 14.4 % (ref 11.5–15.5)
WBC: 3.2 10*3/uL — AB (ref 4.0–10.5)

## 2017-07-01 LAB — COMPREHENSIVE METABOLIC PANEL
ALK PHOS: 97 U/L (ref 38–126)
ALT: 20 U/L (ref 17–63)
AST: 29 U/L (ref 15–41)
Albumin: 3.5 g/dL (ref 3.5–5.0)
Anion gap: 13 (ref 5–15)
BUN: 42 mg/dL — AB (ref 6–20)
CO2: 28 mmol/L (ref 22–32)
Calcium: 8.5 mg/dL — ABNORMAL LOW (ref 8.9–10.3)
Chloride: 96 mmol/L — ABNORMAL LOW (ref 101–111)
Creatinine, Ser: 12.82 mg/dL — ABNORMAL HIGH (ref 0.61–1.24)
GFR, EST AFRICAN AMERICAN: 5 mL/min — AB (ref >60)
GFR, EST NON AFRICAN AMERICAN: 4 mL/min — AB (ref >60)
GLUCOSE: 83 mg/dL (ref 65–99)
Potassium: 4.6 mmol/L (ref 3.5–5.1)
SODIUM: 137 mmol/L (ref 135–145)
TOTAL PROTEIN: 8.1 g/dL (ref 6.5–8.1)
Total Bilirubin: 0.6 mg/dL (ref 0.3–1.2)

## 2017-07-01 SURGERY — RESECTION OF ARTERIOVENOUS FISTULA ANEURYSM
Anesthesia: Monitor Anesthesia Care | Laterality: Left

## 2017-07-01 MED ORDER — VANCOMYCIN HCL IN DEXTROSE 1-5 GM/200ML-% IV SOLN
INTRAVENOUS | Status: AC
  Filled 2017-07-01: qty 200

## 2017-07-01 MED ORDER — SODIUM CHLORIDE 0.9 % IV SOLN: INTRAVENOUS | Status: DC

## 2017-07-01 MED ORDER — VANCOMYCIN HCL IN DEXTROSE 1-5 GM/200ML-% IV SOLN: 1000.0000 mg | INTRAVENOUS | Status: DC

## 2017-07-01 NOTE — Progress Notes (Signed)
Pt came to unit and was confused, unable to understand instructions, not oriented to situation or person.  He was mumbling to himself and expressing word garble, but nothing that made any sense.  Dr. Trula Slade was consulted.  I called and spoke with the patient's brother who said that this was not his baseline and that he usually understood what was being said as long as it was explained.  This was not the case.  The patient spent 15 minutes in the bathroom.  Dr. Trula Slade would like his surgery cancelled and for him to be evaluated in the ED. I called the ED and spoke with charge nurse, we brought the patient down to the triage desk as requested and his brother has been informed that he is now in the ED.

## 2017-07-01 NOTE — ED Triage Notes (Signed)
Pt in from short stay per report pt was to have a procedure on AV fistula and was reported was not at neuro baseline, unknown neuro baseline, pts brother contacted per nurse first who was not able to give any  More information, unknown how pt got to hospital, airway intact, pt A&O x4

## 2017-07-01 NOTE — ED Notes (Signed)
Pt disconnected from monitor. Pt is getting changed out of gown.

## 2017-07-01 NOTE — ED Notes (Signed)
No answer x1

## 2017-07-01 NOTE — ED Provider Notes (Signed)
[place] EMERGENCY DEPARTMENT Provider Note   CSN: [id number] Arrival date & time: 07/01/17  1019     History   Chief Complaint Chief Complaint  Patient presents with  . Altered Mental Status    HPI John Woodard is a 46 y.o. male who presents from short stay for altered mental status.  Patient has a history of end-stage renal disease on dialysis.  He has a small scab at the base of his left AV fistula which was scheduled for repair today and a catheter in his right subclavian.  Patient was sent for evaluation of his mental status.  Review of EMR says that the patient was confused.  That they spoke to his brother he said that that is not his baseline.  In the emergency department the patient is hard of hearing with some obvious cognitive delay affecting his ability to respond appropriately.  He denies any recent illnesses.  He is able to tell me that he was scheduled for repair of his AV fistula and is unsure why he is in the ER.  He is also concerned because it has bled profusely.  HPI  Past Medical History:  Diagnosis Date  . Dialysis patient ([place])   . Dyspnea   . ESRD on dialysis [facility])    [place]  . Hypertension   . Poor historian   . Renal disorder     Patient Active Problem List   Diagnosis Date Noted  . Hyperkalemia 03/28/2017  . Uremia 03/28/2017  . Pancytopenia ([place]) 03/28/2017  . GERD (gastroesophageal reflux disease) 03/28/2017  . Hypertension   . ESRD on dialysis [facility])     Past Surgical History:  Procedure Laterality Date  . A/V SHUNTOGRAM Left 04/03/2017   Procedure: A/V Shuntogram;  Surgeon: Elam Dutch, MD;  Location: [place] [facility];  Service: Cardiovascular;  Laterality: Left;  . AVF Left   . PERIPHERAL VASCULAR BALLOON ANGIOPLASTY Left 04/03/2017   Procedure: PERIPHERAL VASCULAR BALLOON ANGIOPLASTY;  Surgeon: Elam Dutch, MD;  Location: [place] [facility];  Service: Cardiovascular;  Laterality: Left;  Arm  AV fistula  . VASCULAR SURGERY         Home Medications    Prior to Admission medications   Medication Sig Start Date End Date Taking? Authorizing Provider  acetaminophen (TYLENOL) 500 MG tablet Take 500 mg by mouth every 6 (six) hours as needed for moderate pain.   Yes [provider]  amLODipine (NORVASC) 10 MG tablet Take 10 mg by mouth at bedtime.   Yes [provider]  cinacalcet (SENSIPAR) 30 MG tablet Take 30 mg by mouth every Monday, Wednesday, and Friday.    Yes [provider]  cloNIDine (CATAPRES) 0.3 MG tablet Take 1 tablet (0.3 mg total) by mouth 3 (three) times daily. 04/07/17  Yes Aline August, MD  doxazosin (CARDURA) 4 MG tablet Take 4 mg by mouth at bedtime.   Yes [provider]  esomeprazole (NEXIUM) 40 MG capsule Take 40 mg by mouth daily before breakfast.   Yes [provider]  furosemide (LASIX) 40 MG tablet Take 40 mg by mouth daily.   Yes [provider]  hydrALAZINE (APRESOLINE) 100 MG tablet Take 1 tablet (100 mg total) by mouth 3 (three) times daily. 04/07/17  Yes Aline August, MD  losartan (COZAAR) 100 MG tablet Take 100 mg by mouth daily.   Yes [provider]  metoprolol succinate (TOPROL-XL) 50 MG 24 hr tablet Take 1  tablet (50 mg total) by mouth at bedtime. Take with or immediately following a meal. 04/07/17  Yes Aline August, MD  multivitamin (RENA-VIT) TABS tablet Take 1 tablet by mouth daily.   Yes [provider]  sevelamer carbonate (RENVELA) 800 MG tablet Take 800-1,600 mg by mouth See admin instructions. 1600mg  three times daily with meals and 800mg  with one snack.   Yes [provider]    Family History Family History  Problem Relation Age of Onset  . Hypertension Mother   . Diabetes Mellitus II Sister     Social History Social History   Tobacco Use  . Smoking status: Never Smoker  . Smokeless tobacco: Never Used  Substance Use Topics  . Alcohol use: No    . Drug use: No     Allergies   Naproxen; Lactose intolerance (gi); and Penicillins   Review of Systems Review of Systems Ten systems reviewed and are negative for acute change, except as noted in the HPI.    Physical Exam Updated Vital Signs BP 131/90 (BP Location: Right Arm)   Pulse 77   Temp (!) 97.5 F (36.4 C) (Oral)   Resp 14   SpO2 100%   Physical Exam  Constitutional: He appears well-developed and well-nourished. No distress.  HENT:  Head: Normocephalic and atraumatic.  Eyes: Conjunctivae are normal. No scleral icterus.  Neck: Normal range of motion. Neck supple.  Cardiovascular: Normal rate, regular rhythm and normal heart sounds.  Left AV fistula with palpable thrill.  There is a small scab on the inferior margin of the fistula.  Pulmonary/Chest: Effort normal and breath sounds normal. No respiratory distress.  Abdominal: Soft. There is no tenderness.  Musculoskeletal: He exhibits no edema.  Neurological: He is alert.  Repetitive, echolalia Vocabulary is limited   Skin: Skin is warm and dry. He is not diaphoretic.  Psychiatric: His behavior is normal.  Nursing note and vitals reviewed.    ED Treatments / Results  Labs (all labs ordered are listed, but only abnormal results are displayed) Labs Reviewed  CBC WITH DIFFERENTIAL/PLATELET - Abnormal; Notable for the following components:      Result Value   WBC 3.2 (*)    RBC 3.98 (*)    Hemoglobin 10.8 (*)    HCT 35.0 (*)    Platelets 107 (*)    Neutro Abs 1.5 (*)    All other components within normal limits  COMPREHENSIVE METABOLIC PANEL - Abnormal; Notable for the following components:   Chloride 96 (*)    BUN 42 (*)    Creatinine, Ser 12.82 (*)    Calcium 8.5 (*)    GFR calc non Af Amer 4 (*)    GFR calc Af Amer 5 (*)    All other components within normal limits    EKG  EKG Interpretation None       Radiology No results found.  Procedures Procedures (including critical care  time)  Medications Ordered in ED Medications - No data to display   Initial Impression / Assessment and Plan / ED Course  I have reviewed the triage vital signs and the nursing notes.  Pertinent labs & imaging results that were available during my care of the patient were reviewed by me and considered in my medical decision making (see chart for details).     I called the patient's brother [name] and had him speak directly to the patient on the phone for about 10 minutes.  Patient was able to discuss the  fact that he understood what his procedure was for and his concerns for not having it.   He does not understand why he is in the emergency room.  His brother was able to confirm that this is his normal baseline and that he does not seem confused or abnormal at all.  I asked the brother to attend him at the next procedure as the patient does not seem able to advocate for himself given his limited communication ability.  I do not think that this represented altered mental mental status at all in this patient.  I also do not think that he will ever have a normal examination or be able to respond appropriately to questioning.  The patient should follow-up with Dr. Trula Slade to reschedule his procedure.  He appears appropriate for discharge at this time.  Patient was seen and shared visit with Dr. Carlis Abbott, who agrees with workup and plan for discharge  Final Clinical Impressions(s) / ED Diagnoses   Final diagnoses:  None    ED Discharge Orders    None       Margarita Mail, PA-C 07/02/17 0201    Fatima Blank, MD 07/05/17 [phone]

## 2017-07-10 ENCOUNTER — Other Ambulatory Visit: Payer: Self-pay | Admitting: *Deleted

## 2017-07-10 ENCOUNTER — Encounter: Payer: Self-pay | Admitting: *Deleted

## 2017-07-10 ENCOUNTER — Telehealth: Payer: Self-pay | Admitting: Surgery

## 2017-07-10 NOTE — Telephone Encounter (Signed)
Pt lm to resch an appt, asked to be txted. Unclear if he wanted an office appt or to resch his cxl'd surgery. Tried to call pt. Phone was off, no vm.

## 2017-07-10 NOTE — Progress Notes (Signed)
Spoke with Nanci Pina regarding surgery for patient on Thursday 07/16/17. He will make arrangements for dialysis to accommodate surgery on a Thursday. Pre-op instruction letter faxed to [facility] to review/give to patient.

## 2017-07-11 NOTE — Progress Notes (Signed)
Spoke to patient's brother who states he will bring him and pick patient up. Also, states patient will have someone to be with him. His brother was not able to review medical history due to being busy. Brother state he will tell patient of arrival time, NPO status, and other instructions. Brother stated we would need to text patient if we needed any response. I advised patient's brother that we did not have a way to text.

## 2017-07-16 ENCOUNTER — Ambulatory Visit (HOSPITAL_COMMUNITY): Payer: Medicare Other | Admitting: Anesthesiology

## 2017-07-16 ENCOUNTER — Encounter (HOSPITAL_COMMUNITY): Payer: Self-pay

## 2017-07-16 ENCOUNTER — Ambulatory Visit (HOSPITAL_COMMUNITY)
Admission: RE | Admit: 2017-07-16 | Discharge: 2017-07-16 | Disposition: A | Discharge status: Home / Self Care | Payer: Medicare Other | Source: Ambulatory Visit | Attending: Surgery | Admitting: Surgery

## 2017-07-16 ENCOUNTER — Encounter (HOSPITAL_COMMUNITY)
Admission: RE | Disposition: A | Discharge status: Home / Self Care | Payer: Self-pay | Source: Ambulatory Visit | Attending: Surgery

## 2017-07-16 ENCOUNTER — Other Ambulatory Visit: Payer: Self-pay

## 2017-07-16 DIAGNOSIS — Z992 Dependence on renal dialysis: Secondary | ICD-10-CM | POA: Diagnosis not present

## 2017-07-16 DIAGNOSIS — Y828 Other medical devices associated with adverse incidents: Secondary | ICD-10-CM | POA: Diagnosis not present

## 2017-07-16 DIAGNOSIS — Z888 Allergy status to other drugs, medicaments and biological substances status: Secondary | ICD-10-CM | POA: Diagnosis not present

## 2017-07-16 DIAGNOSIS — Z880 Allergy status to penicillin: Secondary | ICD-10-CM | POA: Insufficient documentation

## 2017-07-16 DIAGNOSIS — T82858A Stenosis of vascular prosthetic devices, implants and grafts, initial encounter: Secondary | ICD-10-CM | POA: Insufficient documentation

## 2017-07-16 DIAGNOSIS — Z79899 Other long term (current) drug therapy: Secondary | ICD-10-CM | POA: Diagnosis not present

## 2017-07-16 DIAGNOSIS — N186 End stage renal disease: Secondary | ICD-10-CM | POA: Diagnosis present

## 2017-07-16 DIAGNOSIS — T82898A Other specified complication of vascular prosthetic devices, implants and grafts, initial encounter: Secondary | ICD-10-CM

## 2017-07-16 DIAGNOSIS — I120 Hypertensive chronic kidney disease with stage 5 chronic kidney disease or end stage renal disease: Secondary | ICD-10-CM | POA: Insufficient documentation

## 2017-07-16 HISTORY — PX: FISTULOGRAM: SHX5832

## 2017-07-16 HISTORY — PX: REVISON OF ARTERIOVENOUS FISTULA: SHX6074

## 2017-07-16 HISTORY — DX: Unspecified atrial fibrillation: I48.91

## 2017-07-16 LAB — POCT I-STAT 4, (NA,K, GLUC, HGB,HCT)
GLUCOSE: 82 mg/dL (ref 65–99)
HEMATOCRIT: 32 % — AB (ref 39.0–52.0)
HEMOGLOBIN: 10.9 g/dL — AB (ref 13.0–17.0)
POTASSIUM: 4.6 mmol/L (ref 3.5–5.1)
SODIUM: 137 mmol/L (ref 135–145)

## 2017-07-16 SURGERY — REVISON OF ARTERIOVENOUS FISTULA
Anesthesia: General | Site: Arm Upper | Laterality: Left

## 2017-07-16 MED ORDER — DEXAMETHASONE SODIUM PHOSPHATE 10 MG/ML IJ SOLN
INTRAMUSCULAR | Status: AC
  Filled 2017-07-16: qty 1

## 2017-07-16 MED ORDER — PROPOFOL 10 MG/ML IV BOLUS
INTRAVENOUS | Status: AC
  Filled 2017-07-16: qty 20

## 2017-07-16 MED ORDER — VANCOMYCIN HCL IN DEXTROSE 1-5 GM/200ML-% IV SOLN
1000.0000 mg | INTRAVENOUS | Status: AC
  Administered 2017-07-16: 1000 mg via INTRAVENOUS

## 2017-07-16 MED ORDER — HYDROMORPHONE HCL 1 MG/ML IJ SOLN
0.2500 mg | INTRAMUSCULAR | Status: DC | PRN
  Administered 2017-07-16: 0.5 mg via INTRAVENOUS

## 2017-07-16 MED ORDER — FENTANYL CITRATE (PF) 100 MCG/2ML IJ SOLN
INTRAMUSCULAR | Status: DC | PRN
  Administered 2017-07-16: 50 ug via INTRAVENOUS
  Administered 2017-07-16: 25 ug via INTRAVENOUS
  Administered 2017-07-16: 100 ug via INTRAVENOUS
  Administered 2017-07-16: 25 ug via INTRAVENOUS

## 2017-07-16 MED ORDER — DEXAMETHASONE SODIUM PHOSPHATE 10 MG/ML IJ SOLN
INTRAMUSCULAR | Status: DC | PRN
  Administered 2017-07-16: 5 mg via INTRAVENOUS

## 2017-07-16 MED ORDER — PROMETHAZINE HCL 25 MG/ML IJ SOLN: 6.2500 mg | INTRAMUSCULAR | Status: DC | PRN

## 2017-07-16 MED ORDER — ONDANSETRON HCL 4 MG/2ML IJ SOLN
INTRAMUSCULAR | Status: AC
  Filled 2017-07-16: qty 4

## 2017-07-16 MED ORDER — MIDAZOLAM HCL 2 MG/2ML IJ SOLN
INTRAMUSCULAR | Status: DC | PRN
  Administered 2017-07-16: 2 mg via INTRAVENOUS

## 2017-07-16 MED ORDER — 0.9 % SODIUM CHLORIDE (POUR BTL) OPTIME
TOPICAL | Status: DC | PRN
  Administered 2017-07-16: 1000 mL

## 2017-07-16 MED ORDER — VANCOMYCIN HCL IN DEXTROSE 1-5 GM/200ML-% IV SOLN
INTRAVENOUS | Status: AC
  Filled 2017-07-16: qty 200

## 2017-07-16 MED ORDER — PHENYLEPHRINE HCL 10 MG/ML IJ SOLN
INTRAMUSCULAR | Status: DC | PRN
  Administered 2017-07-16: 80 ug via INTRAVENOUS

## 2017-07-16 MED ORDER — OXYCODONE-ACETAMINOPHEN 5-325 MG PO TABS: 1.0000 | ORAL_TABLET | Freq: Four times a day (QID) | ORAL | 0 refills | Status: AC | PRN

## 2017-07-16 MED ORDER — FENTANYL CITRATE (PF) 250 MCG/5ML IJ SOLN
INTRAMUSCULAR | Status: AC
  Filled 2017-07-16: qty 5

## 2017-07-16 MED ORDER — HYDROMORPHONE HCL 1 MG/ML IJ SOLN
INTRAMUSCULAR | Status: AC
  Filled 2017-07-16: qty 1

## 2017-07-16 MED ORDER — LIDOCAINE-EPINEPHRINE (PF) 1 %-1:200000 IJ SOLN
INTRAMUSCULAR | Status: AC
  Filled 2017-07-16: qty 30

## 2017-07-16 MED ORDER — ONDANSETRON HCL 4 MG/2ML IJ SOLN
INTRAMUSCULAR | Status: DC | PRN
  Administered 2017-07-16: 4 mg via INTRAVENOUS

## 2017-07-16 MED ORDER — DEXTROSE 5 % IV SOLN
INTRAVENOUS | Status: DC | PRN
  Administered 2017-07-16: 25 ug/min via INTRAVENOUS

## 2017-07-16 MED ORDER — IODIXANOL 320 MG/ML IV SOLN
INTRAVENOUS | Status: DC | PRN
  Administered 2017-07-16 (×2): 50 mL via INTRAVENOUS

## 2017-07-16 MED ORDER — PROPOFOL 10 MG/ML IV BOLUS
INTRAVENOUS | Status: DC | PRN
  Administered 2017-07-16: 180 mg via INTRAVENOUS

## 2017-07-16 MED ORDER — LIDOCAINE 2% (20 MG/ML) 5 ML SYRINGE
INTRAMUSCULAR | Status: DC | PRN
  Administered 2017-07-16: 100 mg via INTRAVENOUS

## 2017-07-16 MED ORDER — MIDAZOLAM HCL 2 MG/2ML IJ SOLN
INTRAMUSCULAR | Status: AC
  Filled 2017-07-16: qty 2

## 2017-07-16 MED ORDER — SODIUM CHLORIDE 0.9 % IV SOLN
INTRAVENOUS | Status: DC | PRN
  Administered 2017-07-16: 500 mL

## 2017-07-16 MED ORDER — PHENYLEPHRINE 40 MCG/ML (10ML) SYRINGE FOR IV PUSH (FOR BLOOD PRESSURE SUPPORT)
PREFILLED_SYRINGE | INTRAVENOUS | Status: AC
  Filled 2017-07-16: qty 10

## 2017-07-16 MED ORDER — SODIUM CHLORIDE 0.9 % IV SOLN
INTRAVENOUS | Status: DC
  Administered 2017-07-16: 15:00:00 via INTRAVENOUS

## 2017-07-16 MED ORDER — LIDOCAINE 2% (20 MG/ML) 5 ML SYRINGE
INTRAMUSCULAR | Status: AC
  Filled 2017-07-16: qty 10

## 2017-07-16 MED ORDER — HEPARIN SODIUM (PORCINE) 1000 UNIT/ML IJ SOLN
INTRAMUSCULAR | Status: DC | PRN
  Administered 2017-07-16: 3000 [IU] via INTRAVENOUS

## 2017-07-16 SURGICAL SUPPLY — 48 items
ADH SKN CLS APL DERMABOND .7 (GAUZE/BANDAGES/DRESSINGS) ×1
ARMBAND PINK RESTRICT EXTREMIT (MISCELLANEOUS) ×2 IMPLANT
BALLN MUSTANG 9X80X75 (BALLOONS) ×2
BALLOON MUSTANG 9X80X75 (BALLOONS) ×1 IMPLANT
CANISTER SUCT 3000ML PPV (MISCELLANEOUS) ×2 IMPLANT
CATH ANGIO 5F BER2 65CM (CATHETERS) ×2 IMPLANT
CLIP VESOCCLUDE MED 6/CT (CLIP) ×2 IMPLANT
CLIP VESOCCLUDE SM WIDE 6/CT (CLIP) ×2 IMPLANT
COVER PROBE W GEL 5X96 (DRAPES) IMPLANT
DERMABOND ADVANCED (GAUZE/BANDAGES/DRESSINGS) ×1
DERMABOND ADVANCED .7 DNX12 (GAUZE/BANDAGES/DRESSINGS) ×1 IMPLANT
DEVICE TORQUE H2O (MISCELLANEOUS) ×2 IMPLANT
ELECT REM PT RETURN 9FT ADLT (ELECTROSURGICAL) ×2
ELECTRODE REM PT RTRN 9FT ADLT (ELECTROSURGICAL) ×1 IMPLANT
GLOVE BIO SURGEON STRL SZ 6.5 (GLOVE) ×2 IMPLANT
GLOVE BIO SURGEON STRL SZ7 (GLOVE) ×2 IMPLANT
GLOVE BIOGEL PI IND STRL 7.5 (GLOVE) ×1 IMPLANT
GLOVE BIOGEL PI INDICATOR 7.5 (GLOVE) ×1
GLOVE SURG SS PI 7.5 STRL IVOR (GLOVE) ×2 IMPLANT
GOWN STRL REUS W/ TWL LRG LVL3 (GOWN DISPOSABLE) ×2 IMPLANT
GOWN STRL REUS W/ TWL XL LVL3 (GOWN DISPOSABLE) ×2 IMPLANT
GOWN STRL REUS W/TWL LRG LVL3 (GOWN DISPOSABLE) ×2
GOWN STRL REUS W/TWL XL LVL3 (GOWN DISPOSABLE) ×2
GUIDEWIRE ANGLED .035X150CM (WIRE) ×2 IMPLANT
HEMOSTAT SNOW SURGICEL 2X4 (HEMOSTASIS) IMPLANT
KIT BASIN OR (CUSTOM PROCEDURE TRAY) ×2 IMPLANT
KIT ENCORE 26 ADVANTAGE (KITS) ×2 IMPLANT
KIT ROOM TURNOVER OR (KITS) ×2 IMPLANT
NS IRRIG 1000ML POUR BTL (IV SOLUTION) ×2 IMPLANT
PACK CV ACCESS (CUSTOM PROCEDURE TRAY) ×2 IMPLANT
PAD ARMBOARD 7.5X6 YLW CONV (MISCELLANEOUS) ×4 IMPLANT
SET MICROPUNCTURE 5F STIFF (MISCELLANEOUS) ×2 IMPLANT
SHEATH PINNACLE 6F 10CM (SHEATH) ×2 IMPLANT
SPONGE LAP 18X18 X RAY DECT (DISPOSABLE) ×4 IMPLANT
STOPCOCK 4 WAY LG BORE MALE ST (IV SETS) ×2 IMPLANT
SUT PROLENE 5 0 C 1 24 (SUTURE) ×10 IMPLANT
SUT PROLENE 6 0 CC (SUTURE) ×2 IMPLANT
SUT VIC AB 3-0 SH 27 (SUTURE) ×6
SUT VIC AB 3-0 SH 27X BRD (SUTURE) ×3 IMPLANT
SUT VIC AB 4-0 PS2 27 (SUTURE) ×2 IMPLANT
SUT VICRYL 4-0 PS2 18IN ABS (SUTURE) IMPLANT
SYR 20CC LL (SYRINGE) ×4 IMPLANT
SYRINGE 10CC LL (SYRINGE) ×2 IMPLANT
TOWEL GREEN STERILE (TOWEL DISPOSABLE) ×2 IMPLANT
TUBING CIL FLEX 10 FLL-RA (TUBING) ×2 IMPLANT
UNDERPAD 30X30 (UNDERPADS AND DIAPERS) ×2 IMPLANT
WATER STERILE IRR 1000ML POUR (IV SOLUTION) ×2 IMPLANT
WIRE BENTSON .035X145CM (WIRE) ×2 IMPLANT

## 2017-07-16 NOTE — Transfer of Care (Signed)
Immediate Anesthesia Transfer of Care Note  Patient: John Woodard  Procedure(s) Performed: PLICATION  OF ARTERIOVENOUS FISTULA  LEFT ARM (Left Arm Upper) FISTULOGRAM LEFT ARM ARTERIOVENOUS FISTULA (Left Arm Upper)  Patient Location: PACU  Anesthesia Type:General  Level of Consciousness: drowsy  Airway & Oxygen Therapy: Patient Spontanous Breathing and Patient connected to face mask oxygen  Post-op Assessment: Report given to RN and Post -op Vital signs reviewed and stable  Post vital signs: Reviewed and stable  Last Vitals:  Vitals:   07/16/17 0919 07/16/17 1757  BP: (!) 148/99 (!) 139/104  Pulse: 66 78  Resp: 18 18  Temp: 36.4 C 36.6 C  SpO2: 100% 100%    Last Pain:  Vitals:   07/16/17 0919  TempSrc: Oral         Complications: No apparent anesthesia complications

## 2017-07-16 NOTE — Anesthesia Postprocedure Evaluation (Signed)
Anesthesia Post Note  Patient: John Woodard  Procedure(s) Performed: PLICATION  OF ARTERIOVENOUS FISTULA  LEFT ARM (Left Arm Upper) FISTULOGRAM LEFT ARM ARTERIOVENOUS FISTULA (Left Arm Upper)     Patient location during evaluation: PACU Anesthesia Type: General Level of consciousness: awake, awake and alert and oriented Pain management: pain level controlled Vital Signs Assessment: post-procedure vital signs reviewed and stable Respiratory status: spontaneous breathing, nonlabored ventilation and respiratory function stable Cardiovascular status: blood pressure returned to baseline Anesthetic complications: no    Last Vitals:  Vitals:   07/16/17 1857 07/16/17 1915  BP: 127/83 123/82  Pulse: 74 78  Resp: 12 13  Temp:  36.7 C  SpO2: 100% 100%    Last Pain:  Vitals:   07/16/17 1857  TempSrc:   PainSc: Asleep                 Sasha Rueth COKER

## 2017-07-16 NOTE — Anesthesia Procedure Notes (Signed)
Procedure Name: LMA Insertion Date/Time: 07/16/2017 3:45 PM Performed by: Bryson Corona, CRNA Pre-anesthesia Checklist: Patient identified, Emergency Drugs available, Suction available and Patient being monitored Patient Re-evaluated:Patient Re-evaluated prior to induction Oxygen Delivery Method: Circle System Utilized Preoxygenation: Pre-oxygenation with 100% oxygen Induction Type: IV induction Ventilation: Mask ventilation without difficulty LMA: LMA inserted LMA Size: 5.0 Number of attempts: 1 Airway Equipment and Method: Bite block Placement Confirmation: positive ETCO2 Tube secured with: Tape Dental Injury: Teeth and Oropharynx as per pre-operative assessment

## 2017-07-16 NOTE — Discharge Instructions (Signed)
° °  [facility] [place] ° °Discharge Instructions ° °AV Fistula or Graft Surgery for Dialysis Access ° °Please refer to the following instructions for your post-procedure care. Your surgeon or physician assistant will discuss any changes with you. ° °Activity ° °You may drive the day following your surgery, if you are comfortable and no longer taking prescription pain medication. Resume full activity as the soreness in your incision resolves. ° °Bathing/Showering ° °You may shower after you go home. Keep your incision dry for 48 hours. Do not soak in a bathtub, hot tub, or swim until the incision heals completely. You may not shower if you have a hemodialysis catheter. ° °Incision Care ° °Clean your incision with mild soap and water after 48 hours. Pat the area dry with a clean towel. You do not need a bandage unless otherwise instructed. Do not apply any ointments or creams to your incision. You may have skin glue on your incision. Do not peel it off. It will come off on its own in about one week. Your arm may swell a bit after surgery. To reduce swelling use pillows to elevate your arm so it is above your heart. Your doctor will tell you if you need to lightly wrap your arm with an ACE bandage. ° °Diet ° °Resume your normal diet. There are not special food restrictions following this procedure. In order to heal from your surgery, it is CRITICAL to get adequate nutrition. Your body requires vitamins, minerals, and protein. Vegetables are the best source of vitamins and minerals. Vegetables also provide the perfect balance of protein. Processed food has little nutritional value, so try to avoid this. ° °Medications ° °Resume taking all of your medications. If your incision is causing pain, you may take over-the counter pain relievers such as acetaminophen (Tylenol). If you were prescribed a stronger pain medication, please be aware these medications can cause nausea and constipation. Prevent  nausea by taking the medication with a snack or meal. Avoid constipation by drinking plenty of fluids and eating foods with high amount of fiber, such as fruits, vegetables, and grains. Do not take Tylenol if you are taking prescription pain medications. ° ° ° ° °Follow up °Your surgeon may want to see you in the office following your access surgery. If so, this will be arranged at the time of your surgery. ° °Please call us immediately for any of the following conditions: ° °Increased pain, redness, drainage (pus) from your incision site °Fever of 101 degrees or higher °Severe or worsening pain at your incision site °Hand pain or numbness. ° °Reduce your risk of vascular disease: ° °Stop smoking. If you would like help, call [facility] at 1-800-QUIT-NOW (1-800-784-8669) or [place] at 336-586-4000 ° °Manage your cholesterol °Maintain a desired weight °Control your diabetes °Keep your blood pressure down ° °Dialysis ° °It will take several weeks to several months for your new dialysis access to be ready for use. Your surgeon will determine when it is OK to use it. Your nephrologist will continue to direct your dialysis. You can continue to use your Permcath until your new access is ready for use. ° °If you have any questions, please call the office at [phone]. ° °

## 2017-07-16 NOTE — Interval H&P Note (Signed)
History and Physical Interval Note:  07/16/2017 2:30 PM  John Woodard  has presented today for surgery, with the diagnosis of Complication with Fistula  The various methods of treatment have been discussed with the patient and family. After consideration of risks, benefits and other options for treatment, the patient has consented to  Procedure(s): PLICATION  OF ARTERIOVENOUS FISTULA  LEFT ARM (Left) FISTULOGRAM LEFT ARM ARTERIOVENOUS FISTULA (Left) as a surgical intervention .  The patient's history has been reviewed, patient examined, no change in status, stable for surgery.  I have reviewed the patient's chart and labs.  Questions were answered to the patient's satisfaction.     [name]

## 2017-07-16 NOTE — Op Note (Signed)
    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  Approximately 50% of the aneurysmal wall was resected in order to repair the aneurysms and the fistula  Indications: The patient has had difficulty with bleeding from his fistula.  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  He has previously undergone a fistulogram and had angioplasty of the outflow vein.  He comes in today for aneurysm plication and repeat fistulogram.  Procedure:  The patient was identified in the holding area and taken to [place] 16  The patient was then placed supine on the table. general anesthesia was administered.  The patient was prepped and draped in the usual sterile fashion.  A time out was called and antibiotics were administered.  An elliptical incision was made around the base of the 2 aneurysms.  Cautery was used to dissect out the fistula as well as aneurysms, circumferentially.  Once I had full control, 3000 units of heparin were given.  After this circulated, the fistula was occluded proximally and distally.  A #11 blade was used to make an incision within the aneurysms.  These were opened longitudinally.  Approximately 50% of the aneurysm wall was transected along with the overlying skin.  I did remove a significant amount of neointima and thrombus within the fistula.  Once this was all cleaned out, the fistula wall was closed with running 5-0 Prolene in 2 layers.  The clamps were released and there was excellent flow through the fistula again.  Next, I  placed a U stitch with 5-0 Prolene in the distal fistula and cannulated the fistula with a micropuncture needle.  An 018 wire and a micropuncture sheath were placed.  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  Next,  a Bentson wire and a Berenstein 2 catheter were inserted.  With the catheter and a Glidewire, I was able to select the outflow vein.  Ultimately a Bentson wire was inserted as well as a 6 [place] sheath.  I performed primary balloon angioplasty of the outflow peripheral vein with a 9 x 80 Mustang balloon.  I took the balloon to 7 atm.  I was reluctant to inflate the balloon any further over concerns of a possible rupture of the vein.  Once angioplasty was completed, a follow-up imaging was performed which showed significant improvement in the venous outflow tract with residual stenosis of approximately 15-20%.  The sheath was then removed and the pursestring suture was secured to close the puncture site.  The wound was inspected and found to be hemostatic.  I reapproximated the subcutaneous tissue with 3-0 Vicryl and closed the skin with 4-0 Vicryl.  Dermabond was applied.  There was an excellent thrill within the fistula at the end of the procedure.  There were no immediate complications.   Disposition: To PACU stable   V. [name], M.D. [facility] [place] Office: [phone] Pager:  [phone]

## 2017-07-16 NOTE — Anesthesia Preprocedure Evaluation (Addendum)
Anesthesia Evaluation  Patient identified by MRN, date of birth, ID band Patient awake    Reviewed: Allergy & Precautions, NPO status , Patient's Chart, lab work & pertinent test results, reviewed documented beta blocker date and time   History of Anesthesia Complications Negative for: history of anesthetic complications  Airway Mallampati: II  TM Distance: >3 FB Neck ROM: Full    Dental no notable dental hx. (+) Dental Advisory Given   Pulmonary neg pulmonary ROS,    Pulmonary exam normal        Cardiovascular hypertension, Pt. on medications and Pt. on home beta blockers negative cardio ROS Normal cardiovascular exam     Neuro/Psych negative neurological ROS  negative psych ROS   GI/Hepatic Neg liver ROS,   Endo/Other  negative endocrine ROS  Renal/GU Renal disease  negative genitourinary   Musculoskeletal negative musculoskeletal ROS (+)   Abdominal   Peds negative pediatric ROS (+)  Hematology negative hematology ROS (+)   Anesthesia Other Findings   Reproductive/Obstetrics negative OB ROS                            Anesthesia Physical Anesthesia Plan  ASA: III  Anesthesia Plan: General   Post-op Pain Management:    Induction: Intravenous  PONV Risk Score and Plan: 2 and Ondansetron and Dexamethasone  Airway Management Planned: LMA  Additional Equipment:   Intra-op Plan:   Post-operative Plan: Extubation in OR  Informed Consent: I have reviewed the patients History and Physical, chart, labs and discussed the procedure including the risks, benefits and alternatives for the proposed anesthesia with the patient or authorized representative who has indicated his/her understanding and acceptance.   Dental advisory given  Plan Discussed with: CRNA and Anesthesiologist  Anesthesia Plan Comments:        Anesthesia Quick Evaluation

## 2017-07-17 ENCOUNTER — Encounter (HOSPITAL_COMMUNITY): Payer: Self-pay | Admitting: Surgery

## 2017-08-31 ENCOUNTER — Encounter (HOSPITAL_COMMUNITY): Payer: Self-pay | Admitting: Surgery

## 2017-08-31 NOTE — OR Nursing (Signed)
Late entry due to correction of start time.

## 2017-09-07 ENCOUNTER — Encounter: Payer: Self-pay | Admitting: Surgery

## 2017-09-07 ENCOUNTER — Ambulatory Visit (INDEPENDENT_AMBULATORY_CARE_PROVIDER_SITE_OTHER): Payer: Self-pay | Admitting: Surgery

## 2017-09-07 VITALS — BP 114/77 | HR 68 | Temp 97.6°F | Resp 18 | Ht 74.0 in | Wt 179.0 lb

## 2017-09-07 DIAGNOSIS — N186 End stage renal disease: Secondary | ICD-10-CM

## 2017-09-07 DIAGNOSIS — Z992 Dependence on renal dialysis: Secondary | ICD-10-CM

## 2017-09-07 NOTE — Progress Notes (Signed)
   Patient name: John Woodard MRN: [id number] DOB: 1971/04/08 Sex: male  REASON FOR VISIT:     post op  HISTORY OF PRESENT ILLNESS:   John Woodard is a 47 y.o. male who returns today for follow-up.  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  He continues to use a catheter.  CURRENT MEDICATIONS:    Current Outpatient Medications  Medication Sig Dispense Refill  . acetaminophen (TYLENOL) 500 MG tablet Take 500 mg by mouth every 6 (six) hours as needed for moderate pain.    Marland Kitchen amLODipine (NORVASC) 10 MG tablet Take 10 mg by mouth at bedtime.    . cinacalcet (SENSIPAR) 30 MG tablet Take 30 mg by mouth every Monday, Wednesday, and Friday.     . cloNIDine (CATAPRES) 0.3 MG tablet Take 1 tablet (0.3 mg total) by mouth 3 (three) times daily. 90 tablet 0  . doxazosin (CARDURA) 4 MG tablet Take 4 mg by mouth at bedtime.    Marland Kitchen esomeprazole (NEXIUM) 40 MG capsule Take 40 mg by mouth daily before breakfast.    . furosemide (LASIX) 40 MG tablet Take 40 mg by mouth daily.    . hydrALAZINE (APRESOLINE) 100 MG tablet Take 1 tablet (100 mg total) by mouth 3 (three) times daily. 90 tablet 0  . losartan (COZAAR) 100 MG tablet Take 100 mg by mouth daily.    . metoprolol succinate (TOPROL-XL) 50 MG 24 hr tablet Take 1 tablet (50 mg total) by mouth at bedtime. Take with or immediately following a meal. 30 tablet 0  . multivitamin (RENA-VIT) TABS tablet Take 1 tablet by mouth daily.    . sevelamer carbonate (RENVELA) 800 MG tablet Take 800-1,600 mg by mouth See admin instructions. 1600mg  three times daily with meals and 800mg  with one snack.     No current facility-administered medications for this visit.     REVIEW OF SYSTEMS:   [X]  denotes positive finding, [ ]  denotes negative finding Cardiac  Comments:  Chest pain or chest pressure:    Shortness of breath upon exertion:    Short of  breath when lying flat:    Irregular heart rhythm:    Constitutional    Fever or chills:      PHYSICAL EXAM:   Vitals:   09/07/17 1256  BP: 114/77  Pulse: 68  Resp: 18  Temp: 97.6 F (36.4 C)  TempSrc: Oral  SpO2: 100%  Weight: 179 lb (81.2 kg)  Height: 6\' 2"  (1.88 m)    GENERAL: The patient is a well-nourished male, in no acute distress. The vital signs are documented above. CARDIOVASCULAR: There is a regular rate and rhythm. PULMONARY: Non-labored respirations Excellent thrill within fistula.  All wounds have healed.  STUDIES:   None   MEDICAL ISSUES:   Fistula is ready for use Patient requesting EMLA cream to help with cannulation.  [name], MD [facility] [phone] Pager [phone]

## 2018-02-22 ENCOUNTER — Other Ambulatory Visit: Payer: Self-pay

## 2018-02-22 DIAGNOSIS — N186 End stage renal disease: Secondary | ICD-10-CM

## 2018-02-22 DIAGNOSIS — Z01812 Encounter for preprocedural laboratory examination: Secondary | ICD-10-CM

## 2018-02-22 DIAGNOSIS — Z992 Dependence on renal dialysis: Principal | ICD-10-CM

## 2018-04-05 ENCOUNTER — Other Ambulatory Visit: Payer: Self-pay | Admitting: *Deleted

## 2018-04-05 ENCOUNTER — Ambulatory Visit (HOSPITAL_COMMUNITY)
Admission: RE | Admit: 2018-04-05 | Discharge: 2018-04-05 | Disposition: A | Discharge status: Home / Self Care | Payer: Medicare Other | Source: Ambulatory Visit | Attending: Surgery | Admitting: Surgery

## 2018-04-05 ENCOUNTER — Telehealth: Payer: Self-pay | Admitting: *Deleted

## 2018-04-05 ENCOUNTER — Ambulatory Visit (INDEPENDENT_AMBULATORY_CARE_PROVIDER_SITE_OTHER): Payer: Medicare Other | Admitting: Surgery

## 2018-04-05 ENCOUNTER — Other Ambulatory Visit: Payer: Self-pay

## 2018-04-05 ENCOUNTER — Encounter: Payer: Self-pay | Admitting: Surgery

## 2018-04-05 ENCOUNTER — Ambulatory Visit (INDEPENDENT_AMBULATORY_CARE_PROVIDER_SITE_OTHER)
Admission: RE | Admit: 2018-04-05 | Discharge: 2018-04-05 | Disposition: A | Discharge status: Home / Self Care | Payer: Medicare Other | Source: Ambulatory Visit | Attending: Surgery | Admitting: Surgery

## 2018-04-05 ENCOUNTER — Encounter: Payer: Self-pay | Admitting: *Deleted

## 2018-04-05 VITALS — BP 148/107 | HR 66 | Temp 97.0°F | Resp 16 | Ht 74.0 in | Wt 185.1 lb

## 2018-04-05 DIAGNOSIS — Z992 Dependence on renal dialysis: Secondary | ICD-10-CM | POA: Diagnosis not present

## 2018-04-05 DIAGNOSIS — N186 End stage renal disease: Secondary | ICD-10-CM

## 2018-04-05 DIAGNOSIS — Z01812 Encounter for preprocedural laboratory examination: Secondary | ICD-10-CM

## 2018-04-05 DIAGNOSIS — Z01818 Encounter for other preprocedural examination: Secondary | ICD-10-CM | POA: Insufficient documentation

## 2018-04-05 NOTE — Progress Notes (Signed)
Vascular and Vein Specialist of [facility]  Patient name: John Woodard MRN: [id number] DOB: [birth year] Sex: male   REASON FOR VISIT:    Follow up  [place] ILLNESS:    John Woodard is a 47 y.o. male who returns today for dialysis access.  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  This is now occluded and he has been dialyzing through a catheter on Tuesday Thursday Saturday.  He is here today for new access.  The patient is a very poor historian.  He is medically managed for hypertension.  He is a non-smoker.   PAST MEDICAL HISTORY:   Past Medical History:  Diagnosis Date  . Atrial fibrillation ([place])   . Dialysis patient ([place])   . Dyspnea   . ESRD on dialysis [facility])    [place]  . Hypertension   . Poor historian   . Renal disorder      FAMILY HISTORY:   Family History  Problem Relation Age of Onset  . Hypertension Mother   . Diabetes Mellitus II Sister     SOCIAL HISTORY:   Social History   Tobacco Use  . Smoking status: Never Smoker  . Smokeless tobacco: Never Used  Substance Use Topics  . Alcohol use: No     ALLERGIES:   Allergies  Allergen Reactions  . Naproxen Other (See Comments)    "heart stopped one time"  . Lactose Intolerance (Gi) Diarrhea and Other (See Comments)    BLOODY STOOLS  . Penicillins     UNSPECIFIED REACTION      CURRENT MEDICATIONS:   Current Outpatient Medications  Medication Sig Dispense Refill  . acetaminophen (TYLENOL) 500 MG tablet Take 500 mg by mouth every 6 (six) hours as needed for moderate pain.    Marland Kitchen amLODipine (NORVASC) 10 MG tablet Take 10 mg by mouth at bedtime.    . cinacalcet (SENSIPAR) 30 MG tablet Take 30 mg by mouth every Monday, Wednesday, and Friday.     . cloNIDine (CATAPRES) 0.3 MG tablet Take 1 tablet (0.3 mg total) by mouth 3 (three) times daily. 90 tablet 0  . doxazosin (CARDURA) 4 MG tablet Take 4 mg  by mouth at bedtime.    Marland Kitchen esomeprazole (NEXIUM) 40 MG capsule Take 40 mg by mouth daily before breakfast.    . furosemide (LASIX) 40 MG tablet Take 40 mg by mouth daily.    Marland Kitchen losartan (COZAAR) 100 MG tablet Take 100 mg by mouth daily.    . metoprolol succinate (TOPROL-XL) 50 MG 24 hr tablet Take 1 tablet (50 mg total) by mouth at bedtime. Take with or immediately following a meal. 30 tablet 0  . multivitamin (RENA-VIT) TABS tablet Take 1 tablet by mouth daily.    . sevelamer carbonate (RENVELA) 800 MG tablet Take 800-1,600 mg by mouth See admin instructions. 1600mg  three times daily with meals and 800mg  with one snack.     No current facility-administered medications for this visit.     REVIEW OF SYSTEMS:   [X]  denotes positive finding, [ ]  denotes negative finding Cardiac  Comments:  Chest pain or chest pressure:    Shortness of breath upon exertion:    Short of breath when lying flat:    Irregular heart rhythm:        Vascular    Pain in calf, thigh, or hip brought on by ambulation:    Pain in feet at night that wakes you up from your  sleep:     Blood clot in your veins:    Leg swelling:         Pulmonary    Oxygen at home:    Productive cough:     Wheezing:         Neurologic    Sudden weakness in arms or legs:     Sudden numbness in arms or legs:     Sudden onset of difficulty speaking or slurred speech:    Temporary loss of vision in one eye:     Problems with dizziness:         Gastrointestinal    Blood in stool:     Vomited blood:         Genitourinary    Burning when urinating:     Blood in urine:        Psychiatric    Major depression:         Hematologic    Bleeding problems:    Problems with blood clotting too easily:        Skin    Rashes or ulcers:        Constitutional    Fever or chills:      PHYSICAL EXAM:   Vitals:   04/05/18 1114  BP: (!) 148/107  Pulse: 66  Resp: 16  Temp: (!) 97 F (36.1 C)  TempSrc: Oral  SpO2: 100%  Weight:  185 lb 1.6 oz (84 kg)  Height: 6\' 2"  (1.88 m)    GENERAL: The patient is a well-nourished male, in no acute distress. The vital signs are documented above. CARDIAC: There is a regular rate and rhythm.  VASCULAR: Occluded left radiocephalic fistula.  Palpable left brachial pulse PULMONARY: Non-labored respirations  MUSCULOSKELETAL: There are no major deformities or cyanosis. NEUROLOGIC: No focal weakness or paresthesias are detected. SKIN: There are no ulcers or rashes noted. PSYCHIATRIC: The patient has a normal affect.  STUDIES:   I have reviewed his vein mapping.  He appears to have an adequate left basilic vein  MEDICAL ISSUES:   We discussed proceeding with a staged left basilic vein fistula creation.  I have scheduled the first days for Friday, September 20.  All the patient's questions were answered.    [name], MD Vascular and Vein Specialists of [facility] [phone] Pager [phone]

## 2018-04-05 NOTE — H&P (View-Only) (Signed)
Vascular and Vein Specialist of [facility]  Patient name: John Woodard MRN: [id number] DOB: 03/21/1971 Sex: male   REASON FOR VISIT:    Follow up  [place] ILLNESS:    John Woodard is a 47 y.o. male who returns today for dialysis access.  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  This is now occluded and he has been dialyzing through a catheter on Tuesday Thursday Saturday.  He is here today for new access.  The patient is a very poor historian.  He is medically managed for hypertension.  He is a non-smoker.   PAST MEDICAL HISTORY:   Past Medical History:  Diagnosis Date  . Atrial fibrillation ([place])   . Dialysis patient ([place])   . Dyspnea   . ESRD on dialysis [facility])    [place]  . Hypertension   . Poor historian   . Renal disorder      FAMILY HISTORY:   Family History  Problem Relation Age of Onset  . Hypertension Mother   . Diabetes Mellitus II Sister     SOCIAL HISTORY:   Social History   Tobacco Use  . Smoking status: Never Smoker  . Smokeless tobacco: Never Used  Substance Use Topics  . Alcohol use: No     ALLERGIES:   Allergies  Allergen Reactions  . Naproxen Other (See Comments)    "heart stopped one time"  . Lactose Intolerance (Gi) Diarrhea and Other (See Comments)    BLOODY STOOLS  . Penicillins     UNSPECIFIED REACTION      CURRENT MEDICATIONS:   Current Outpatient Medications  Medication Sig Dispense Refill  . acetaminophen (TYLENOL) 500 MG tablet Take 500 mg by mouth every 6 (six) hours as needed for moderate pain.    Marland Kitchen amLODipine (NORVASC) 10 MG tablet Take 10 mg by mouth at bedtime.    . cinacalcet (SENSIPAR) 30 MG tablet Take 30 mg by mouth every Monday, Wednesday, and Friday.     . cloNIDine (CATAPRES) 0.3 MG tablet Take 1 tablet (0.3 mg total) by mouth 3 (three) times daily. 90 tablet 0  . doxazosin (CARDURA) 4 MG tablet Take 4 mg  by mouth at bedtime.    Marland Kitchen esomeprazole (NEXIUM) 40 MG capsule Take 40 mg by mouth daily before breakfast.    . furosemide (LASIX) 40 MG tablet Take 40 mg by mouth daily.    Marland Kitchen losartan (COZAAR) 100 MG tablet Take 100 mg by mouth daily.    . metoprolol succinate (TOPROL-XL) 50 MG 24 hr tablet Take 1 tablet (50 mg total) by mouth at bedtime. Take with or immediately following a meal. 30 tablet 0  . multivitamin (RENA-VIT) TABS tablet Take 1 tablet by mouth daily.    . sevelamer carbonate (RENVELA) 800 MG tablet Take 800-1,600 mg by mouth See admin instructions. 1600mg  three times daily with meals and 800mg  with one snack.     No current facility-administered medications for this visit.     REVIEW OF SYSTEMS:   [X]  denotes positive finding, [ ]  denotes negative finding Cardiac  Comments:  Chest pain or chest pressure:    Shortness of breath upon exertion:    Short of breath when lying flat:    Irregular heart rhythm:        Vascular    Pain in calf, thigh, or hip brought on by ambulation:    Pain in feet at night that wakes you up from your  sleep:     Blood clot in your veins:    Leg swelling:         Pulmonary    Oxygen at home:    Productive cough:     Wheezing:         Neurologic    Sudden weakness in arms or legs:     Sudden numbness in arms or legs:     Sudden onset of difficulty speaking or slurred speech:    Temporary loss of vision in one eye:     Problems with dizziness:         Gastrointestinal    Blood in stool:     Vomited blood:         Genitourinary    Burning when urinating:     Blood in urine:        Psychiatric    Major depression:         Hematologic    Bleeding problems:    Problems with blood clotting too easily:        Skin    Rashes or ulcers:        Constitutional    Fever or chills:      PHYSICAL EXAM:   Vitals:   04/05/18 1114  BP: (!) 148/107  Pulse: 66  Resp: 16  Temp: (!) 97 F (36.1 C)  TempSrc: Oral  SpO2: 100%  Weight:  185 lb 1.6 oz (84 kg)  Height: 6\' 2"  (1.88 m)    GENERAL: The patient is a well-nourished male, in no acute distress. The vital signs are documented above. CARDIAC: There is a regular rate and rhythm.  VASCULAR: Occluded left radiocephalic fistula.  Palpable left brachial pulse PULMONARY: Non-labored respirations  MUSCULOSKELETAL: There are no major deformities or cyanosis. NEUROLOGIC: No focal weakness or paresthesias are detected. SKIN: There are no ulcers or rashes noted. PSYCHIATRIC: The patient has a normal affect.  STUDIES:   I have reviewed his vein mapping.  He appears to have an adequate left basilic vein  MEDICAL ISSUES:   We discussed proceeding with a staged left basilic vein fistula creation.  I have scheduled the first days for Friday, September 20.  All the patient's questions were answered.    [name], MD Vascular and Vein Specialists of [facility] [phone] Pager [phone]

## 2018-04-05 NOTE — Progress Notes (Signed)
Phone call to [facility] at [employer]/ and letter of pre-op instructions faxed. Requested they review and remind patient instructions and she is going to contact transportation services for ride to hospital. Info included with booking to contact [facility] for help with this patient.

## 2018-04-07 NOTE — Telephone Encounter (Signed)
No notes

## 2018-04-08 ENCOUNTER — Encounter (HOSPITAL_COMMUNITY): Payer: Self-pay | Admitting: *Deleted

## 2018-04-08 NOTE — Progress Notes (Signed)
Anesthesia Chart Review: John Woodard  Case:  [id number] Date/Time:  04/09/18 1030   Procedure:  FIRST STAGE BACILIC VEIN TRANSPOSITION LEFT ARM (Left )   Anesthesia type:  Monitor Anesthesia Care   Pre-op diagnosis:  END STAGE RENAL DISEASE FOR HEMODIALYSIS ACCESS   Location:  [facility] OR ROOM 10 / [place] OR   Surgeon:  Serafina Mitchell, MD      DISCUSSION: Patient is a 47 year old male scheduled for the above procedure.   History includes ESRD (on hemodialysis TTS, [facility]), never smoker, HTN, afib, dyspnea, poor historian, hard of hearing, anemia. [facility] records (Care Everywhere) from 10/30/16 also list history of neurogenic bladder with suprapubic catheter, cognitive impairment with psych overlay. He is s/p plication of LUE AVF on 07/16/17 at [facility].   Duration of his afib is uncertain. There is a 10 year time frame between EKGs that show SR (2008) to when afib was noted on [facility] EKG on 10/31/16. During his 03/2017 hospitalization at [facility] (for hyperkalemia in the setting of 4 missed dialysis sessions), the hospitalist felt patient was a poor candidate for anticoagulation give compliance issues. Echo showed normal LVEF without wall motion abnormalities, grade II diastolic dysfunction, biatrial enlargement, moderate TR. Out-patient PCP or cardiology follow-up recommended regarding further discussion about anticoagulation.   Patient's afib history was previously reviewed with anesthesiologist prior to patient's December 2018 surgery. Patient is a same day work-up, so he will be evaluated on the day of surgery. If HR is controlled, labs and vitals acceptable, and no acute CV/CHF symptoms then I would anticipate that he can proceed with hemodialysis access procedure. Meds include b-blocker therapy. Definitive anesthesia plan following anesthesiologist evaluation.    PROVIDERS: Dr. Kriste Basque, Nephrologist   LABS: He is for labs on the day of surgery.    EKG: EKG  07/01/17: atrial fibrillation (72 bpm). LAD. Septal infarct, age undetermined. Persistent peaked T waves in V4. New negative T wave in V6 which was more flat on 03/28/17 tracing. He developed afib sometime between 03/19/07 and 10/31/16 (Care Everywhere).    CV: Echo 04/01/17:  Study Conclusions: - Left ventricle: The cavity size was normal. There was severeconcentric hypertrophy. Systolic function was vigorous. Theestimated ejection fraction was in the range of 65% to 70%. Wallmotion was normal; there were no regional wall motionabnormalities. Features are consistent with a pseudonormal leftventricular filling pattern, with concomitant abnormal relaxation and increased filling pressure (grade 2 diastolic dysfunction). - Aortic valve: There was mild regurgitation. - Mitral valve: There was mild regurgitation. - Left atrium: The atrium was severely dilated. - Right atrium: The atrium was severely dilated. - Tricuspid valve: There was moderate regurgitation.    Past Medical History:  Diagnosis Date  . Atrial fibrillation ([place])   . Dialysis patient ([place])   . Dyspnea   . ESRD on dialysis [facility])    [place]  . Hypertension   . Poor historian   . Renal disorder     Past Surgical History:  Procedure Laterality Date  . A/V SHUNTOGRAM Left 04/03/2017   Procedure: A/V Shuntogram;  Surgeon: Elam Dutch, MD;  Location: [place] [facility];  Service: Cardiovascular;  Laterality: Left;  . AVF Left   . FISTULOGRAM Left 07/16/2017   Procedure: FISTULOGRAM LEFT ARM ARTERIOVENOUS FISTULA;  Surgeon: Serafina Mitchell, MD;  Location: [facility] OR;  Service: Vascular;  Laterality: Left;  . PERIPHERAL VASCULAR BALLOON ANGIOPLASTY Left 04/03/2017   Procedure: PERIPHERAL VASCULAR  BALLOON ANGIOPLASTY;  Surgeon: Elam Dutch, MD;  Location: [place] [facility] LAB;  Service: Cardiovascular;  Laterality: Left;  Arm AV fistula  . REVISON OF ARTERIOVENOUS FISTULA Left 54/62/7035   Procedure: PLICATION   OF ARTERIOVENOUS FISTULA  LEFT ARM;  Surgeon: Serafina Mitchell, MD;  Location: [place];  Service: Vascular;  Laterality: Left;  Marland Kitchen VASCULAR SURGERY      MEDICATIONS: No current facility-administered medications for this encounter.    Marland Kitchen acetaminophen (TYLENOL) 500 MG tablet  . amLODipine (NORVASC) 10 MG tablet  . cinacalcet (SENSIPAR) 30 MG tablet  . cloNIDine (CATAPRES) 0.3 MG tablet  . doxazosin (CARDURA) 4 MG tablet  . esomeprazole (NEXIUM) 40 MG capsule  . furosemide (LASIX) 40 MG tablet  . losartan (COZAAR) 100 MG tablet  . metoprolol succinate (TOPROL-XL) 50 MG 24 hr tablet  . multivitamin (RENA-VIT) TABS tablet  . sevelamer carbonate (RENVELA) 800 MG tablet    George Hugh [facility] Short Stay [facility]/Anesthesiology Phone [phone] 04/08/2018 3:28 PM

## 2018-04-08 NOTE — Anesthesia Preprocedure Evaluation (Addendum)
Anesthesia Evaluation  Patient identified by MRN, date of birth, ID band Patient awake    Reviewed: Allergy & Precautions, NPO status , Patient's Chart, lab work & pertinent test results, reviewed documented beta blocker date and time   History of Anesthesia Complications Negative for: history of anesthetic complications  Airway Mallampati: II  TM Distance: >3 FB Neck ROM: Full    Dental no notable dental hx. (+) Teeth Intact   Pulmonary shortness of breath,    Pulmonary exam normal breath sounds clear to auscultation       Cardiovascular hypertension, Pt. on home beta blockers and Pt. on medications Normal cardiovascular exam+ dysrhythmias Atrial Fibrillation  Rhythm:Regular Rate:Normal  Echo Sep 2018 Study Conclusions  - Left ventricle: The cavity size was normal. There was severe   concentric hypertrophy. Systolic function was vigorous. The   estimated ejection fraction was in the range of 65% to 70%. Wall   motion was normal; there were no regional wall motion   abnormalities. Features are consistent with a pseudonormal left   ventricular filling pattern, with concomitant abnormal relaxation   and increased filling pressure (grade 2 diastolic dysfunction). - Aortic valve: There was mild regurgitation. - Mitral valve: There was mild regurgitation. - Left atrium: The atrium was severely dilated. - Right atrium: The atrium was severely dilated. - Tricuspid valve: There was moderate regurgitation   Neuro/Psych Cognitive impairment with Psych overlaynegative neurological ROS     GI/Hepatic Neg liver ROS, GERD  Medicated and Controlled,  Endo/Other  negative endocrine ROS  Renal/GU ESRF and DialysisRenal disease Bladder dysfunction (suprapubic catheter)      Musculoskeletal negative musculoskeletal ROS (+)   Abdominal   Peds  Hematology negative hematology ROS (+) anemia ,   Anesthesia Other Findings   Reproductive/Obstetrics                           Anesthesia Physical Anesthesia Plan  ASA: IV  Anesthesia Plan: General   Post-op Pain Management:    Induction: Intravenous  PONV Risk Score and Plan: 3 and Ondansetron, Dexamethasone and Treatment may vary due to age or medical condition  Airway Management Planned: LMA  Additional Equipment:   Intra-op Plan:   Post-operative Plan: Extubation in OR  Informed Consent: I have reviewed the patients History and Physical, chart, labs and discussed the procedure including the risks, benefits and alternatives for the proposed anesthesia with the patient or authorized representative who has indicated his/her understanding and acceptance.   Dental advisory given  Plan Discussed with: CRNA and Surgeon  Anesthesia Plan Comments:       Anesthesia Quick Evaluation

## 2018-04-08 NOTE — Progress Notes (Addendum)
I spoke to Mr Mixon's dialysis nurse this afternoon, patient was on dialysis at that time. Dialysis nurse had instructions from [facility] at [facility]  Regarding time to arrive and what medications to take. I called patients number, no answer, no voice mail.  I called his brother , [name].  [name] said that patient will be arrive via transportation company that takes him to dialysis.  [name] handed [employer] his phone, who said he did not have any information from dialysis about what medications to take. Mr Carlis Abbott said that he has a letter from the post office that told him when to come and  Had a map on it.  I instructed Woodard to not eat or drink anything after midnight, shower , brush teeth and wear clean clothes.  I told [place] to not take any medications in the morning.  I gave [name] the same information, [name] said that he will go over the information with him. [name] signed patient's surgical consent last December.  [name] will be at work but should be reachable on his cell.  [name] will not be able to pick [place] up until after 2000.

## 2018-04-09 ENCOUNTER — Encounter (HOSPITAL_COMMUNITY)
Admission: RE | Disposition: A | Discharge status: Home / Self Care | Payer: Self-pay | Source: Ambulatory Visit | Attending: Surgery

## 2018-04-09 ENCOUNTER — Ambulatory Visit (HOSPITAL_COMMUNITY): Payer: Medicare Other | Admitting: Vascular Surgery

## 2018-04-09 ENCOUNTER — Other Ambulatory Visit: Payer: Self-pay

## 2018-04-09 ENCOUNTER — Telehealth: Payer: Self-pay | Admitting: Surgery

## 2018-04-09 ENCOUNTER — Encounter (HOSPITAL_COMMUNITY): Payer: Self-pay | Admitting: *Deleted

## 2018-04-09 ENCOUNTER — Ambulatory Visit (HOSPITAL_COMMUNITY)
Admission: RE | Admit: 2018-04-09 | Discharge: 2018-04-09 | Disposition: A | Discharge status: Home / Self Care | Payer: Medicare Other | Source: Ambulatory Visit | Attending: Surgery | Admitting: Surgery

## 2018-04-09 DIAGNOSIS — N186 End stage renal disease: Secondary | ICD-10-CM | POA: Diagnosis not present

## 2018-04-09 DIAGNOSIS — I120 Hypertensive chronic kidney disease with stage 5 chronic kidney disease or end stage renal disease: Secondary | ICD-10-CM | POA: Insufficient documentation

## 2018-04-09 DIAGNOSIS — I4891 Unspecified atrial fibrillation: Secondary | ICD-10-CM | POA: Insufficient documentation

## 2018-04-09 DIAGNOSIS — E739 Lactose intolerance, unspecified: Secondary | ICD-10-CM | POA: Insufficient documentation

## 2018-04-09 DIAGNOSIS — Z880 Allergy status to penicillin: Secondary | ICD-10-CM | POA: Diagnosis not present

## 2018-04-09 DIAGNOSIS — I083 Combined rheumatic disorders of mitral, aortic and tricuspid valves: Secondary | ICD-10-CM | POA: Diagnosis not present

## 2018-04-09 DIAGNOSIS — Z79899 Other long term (current) drug therapy: Secondary | ICD-10-CM | POA: Insufficient documentation

## 2018-04-09 DIAGNOSIS — Z886 Allergy status to analgesic agent status: Secondary | ICD-10-CM | POA: Diagnosis not present

## 2018-04-09 DIAGNOSIS — Z992 Dependence on renal dialysis: Secondary | ICD-10-CM | POA: Insufficient documentation

## 2018-04-09 DIAGNOSIS — N185 Chronic kidney disease, stage 5: Secondary | ICD-10-CM | POA: Diagnosis not present

## 2018-04-09 DIAGNOSIS — K219 Gastro-esophageal reflux disease without esophagitis: Secondary | ICD-10-CM | POA: Insufficient documentation

## 2018-04-09 HISTORY — PX: BASCILIC VEIN TRANSPOSITION: SHX5742

## 2018-04-09 LAB — POCT I-STAT 4, (NA,K, GLUC, HGB,HCT)
Glucose, Bld: 87 mg/dL (ref 70–99)
HEMATOCRIT: 38 % — AB (ref 39.0–52.0)
HEMOGLOBIN: 12.9 g/dL — AB (ref 13.0–17.0)
Potassium: 4.6 mmol/L (ref 3.5–5.1)
SODIUM: 137 mmol/L (ref 135–145)

## 2018-04-09 SURGERY — TRANSPOSITION, VEIN, BASILIC
Anesthesia: General | Site: Arm Upper | Laterality: Left

## 2018-04-09 MED ORDER — HEPARIN SODIUM (PORCINE) 1000 UNIT/ML IJ SOLN
INTRAMUSCULAR | Status: DC | PRN
  Administered 2018-04-09: 3000 [IU] via INTRAVENOUS

## 2018-04-09 MED ORDER — MIDAZOLAM HCL 5 MG/5ML IJ SOLN
INTRAMUSCULAR | Status: DC | PRN
  Administered 2018-04-09 (×2): 1 mg via INTRAVENOUS

## 2018-04-09 MED ORDER — CHLORHEXIDINE GLUCONATE 4 % EX LIQD: 60.0000 mL | Freq: Once | CUTANEOUS | Status: DC

## 2018-04-09 MED ORDER — FENTANYL CITRATE (PF) 100 MCG/2ML IJ SOLN: 25.0000 ug | INTRAMUSCULAR | Status: DC | PRN

## 2018-04-09 MED ORDER — LIDOCAINE-EPINEPHRINE (PF) 1 %-1:200000 IJ SOLN: INTRAMUSCULAR | Status: DC | PRN

## 2018-04-09 MED ORDER — FENTANYL CITRATE (PF) 250 MCG/5ML IJ SOLN
INTRAMUSCULAR | Status: AC
  Filled 2018-04-09: qty 5

## 2018-04-09 MED ORDER — HYDROCODONE-ACETAMINOPHEN 7.5-325 MG PO TABS: 1.0000 | ORAL_TABLET | Freq: Once | ORAL | Status: DC | PRN

## 2018-04-09 MED ORDER — MIDAZOLAM HCL 2 MG/2ML IJ SOLN
INTRAMUSCULAR | Status: AC
  Filled 2018-04-09: qty 2

## 2018-04-09 MED ORDER — PHENYLEPHRINE 40 MCG/ML (10ML) SYRINGE FOR IV PUSH (FOR BLOOD PRESSURE SUPPORT)
PREFILLED_SYRINGE | INTRAVENOUS | Status: AC
  Filled 2018-04-09: qty 10

## 2018-04-09 MED ORDER — FENTANYL CITRATE (PF) 100 MCG/2ML IJ SOLN
INTRAMUSCULAR | Status: DC | PRN
  Administered 2018-04-09: 25 ug via INTRAVENOUS
  Administered 2018-04-09: 50 ug via INTRAVENOUS
  Administered 2018-04-09: 25 ug via INTRAVENOUS

## 2018-04-09 MED ORDER — VANCOMYCIN HCL IN DEXTROSE 1-5 GM/200ML-% IV SOLN
1000.0000 mg | INTRAVENOUS | Status: AC
  Administered 2018-04-09: 1000 mg via INTRAVENOUS

## 2018-04-09 MED ORDER — 0.9 % SODIUM CHLORIDE (POUR BTL) OPTIME
TOPICAL | Status: DC | PRN
  Administered 2018-04-09: 1000 mL

## 2018-04-09 MED ORDER — LIDOCAINE-EPINEPHRINE (PF) 1 %-1:200000 IJ SOLN
INTRAMUSCULAR | Status: AC
  Filled 2018-04-09: qty 30

## 2018-04-09 MED ORDER — DEXAMETHASONE SODIUM PHOSPHATE 10 MG/ML IJ SOLN
INTRAMUSCULAR | Status: AC
  Filled 2018-04-09: qty 1

## 2018-04-09 MED ORDER — VANCOMYCIN HCL IN DEXTROSE 1-5 GM/200ML-% IV SOLN
INTRAVENOUS | Status: AC
  Filled 2018-04-09: qty 200

## 2018-04-09 MED ORDER — HEPARIN SODIUM (PORCINE) 1000 UNIT/ML IJ SOLN
INTRAMUSCULAR | Status: AC
  Filled 2018-04-09: qty 1

## 2018-04-09 MED ORDER — SODIUM CHLORIDE 0.9 % IV SOLN
INTRAVENOUS | Status: AC
  Filled 2018-04-09: qty 1.2

## 2018-04-09 MED ORDER — PROPOFOL 10 MG/ML IV BOLUS
INTRAVENOUS | Status: DC | PRN
  Administered 2018-04-09: 200 mg via INTRAVENOUS

## 2018-04-09 MED ORDER — HEMOSTATIC AGENTS (NO CHARGE) OPTIME
TOPICAL | Status: DC | PRN
  Administered 2018-04-09: 1 via TOPICAL

## 2018-04-09 MED ORDER — PROTAMINE SULFATE 10 MG/ML IV SOLN
INTRAVENOUS | Status: DC | PRN
  Administered 2018-04-09: 25 mg via INTRAVENOUS

## 2018-04-09 MED ORDER — SODIUM CHLORIDE 0.9 % IV SOLN
INTRAVENOUS | Status: DC | PRN
  Administered 2018-04-09: 30 ug/min via INTRAVENOUS

## 2018-04-09 MED ORDER — ONDANSETRON HCL 4 MG/2ML IJ SOLN: 4.0000 mg | Freq: Once | INTRAMUSCULAR | Status: DC | PRN

## 2018-04-09 MED ORDER — ONDANSETRON HCL 4 MG/2ML IJ SOLN
INTRAMUSCULAR | Status: DC | PRN
  Administered 2018-04-09: 4 mg via INTRAVENOUS

## 2018-04-09 MED ORDER — SODIUM CHLORIDE 0.9 % IV SOLN
INTRAVENOUS | Status: DC
  Administered 2018-04-09: 08:00:00 via INTRAVENOUS

## 2018-04-09 MED ORDER — ONDANSETRON HCL 4 MG/2ML IJ SOLN
INTRAMUSCULAR | Status: AC
  Filled 2018-04-09: qty 2

## 2018-04-09 MED ORDER — HYDROCODONE-ACETAMINOPHEN 5-325 MG PO TABS: 1.0000 | ORAL_TABLET | ORAL | 0 refills | Status: DC | PRN

## 2018-04-09 MED ORDER — DEXAMETHASONE SODIUM PHOSPHATE 10 MG/ML IJ SOLN
INTRAMUSCULAR | Status: DC | PRN
  Administered 2018-04-09: 5 mg via INTRAVENOUS

## 2018-04-09 MED ORDER — MEPERIDINE HCL 50 MG/ML IJ SOLN: 6.2500 mg | INTRAMUSCULAR | Status: DC | PRN

## 2018-04-09 MED ORDER — PHENYLEPHRINE 40 MCG/ML (10ML) SYRINGE FOR IV PUSH (FOR BLOOD PRESSURE SUPPORT)
PREFILLED_SYRINGE | INTRAVENOUS | Status: DC | PRN
  Administered 2018-04-09: 80 ug via INTRAVENOUS

## 2018-04-09 MED ORDER — SODIUM CHLORIDE 0.9 % IV SOLN
INTRAVENOUS | Status: DC | PRN
  Administered 2018-04-09: 500 mL

## 2018-04-09 MED ORDER — PROTAMINE SULFATE 10 MG/ML IV SOLN
INTRAVENOUS | Status: AC
  Filled 2018-04-09: qty 5

## 2018-04-09 SURGICAL SUPPLY — 32 items
ARMBAND PINK RESTRICT EXTREMIT (MISCELLANEOUS) ×2 IMPLANT
CANISTER SUCT 3000ML PPV (MISCELLANEOUS) ×2 IMPLANT
CLIP VESOCCLUDE MED 24/CT (CLIP) IMPLANT
CLIP VESOCCLUDE MED 6/CT (CLIP) IMPLANT
CLIP VESOCCLUDE SM WIDE 24/CT (CLIP) IMPLANT
CLIP VESOCCLUDE SM WIDE 6/CT (CLIP) IMPLANT
COVER PROBE W GEL 5X96 (DRAPES) ×2 IMPLANT
DERMABOND ADVANCED (GAUZE/BANDAGES/DRESSINGS) ×1
DERMABOND ADVANCED .7 DNX12 (GAUZE/BANDAGES/DRESSINGS) ×1 IMPLANT
ELECT REM PT RETURN 9FT ADLT (ELECTROSURGICAL) ×2
ELECTRODE REM PT RTRN 9FT ADLT (ELECTROSURGICAL) ×1 IMPLANT
GLOVE BIOGEL PI IND STRL 7.5 (GLOVE) ×1 IMPLANT
GLOVE BIOGEL PI INDICATOR 7.5 (GLOVE) ×1
GLOVE SURG SS PI 7.5 STRL IVOR (GLOVE) ×2 IMPLANT
GOWN STRL REUS W/ TWL LRG LVL3 (GOWN DISPOSABLE) ×2 IMPLANT
GOWN STRL REUS W/ TWL XL LVL3 (GOWN DISPOSABLE) ×1 IMPLANT
GOWN STRL REUS W/TWL LRG LVL3 (GOWN DISPOSABLE) ×2
GOWN STRL REUS W/TWL XL LVL3 (GOWN DISPOSABLE) ×1
HEMOSTAT SNOW SURGICEL 2X4 (HEMOSTASIS) ×2 IMPLANT
KIT BASIN OR (CUSTOM PROCEDURE TRAY) ×2 IMPLANT
KIT TURNOVER KIT B (KITS) ×2 IMPLANT
NS IRRIG 1000ML POUR BTL (IV SOLUTION) ×2 IMPLANT
PACK CV ACCESS (CUSTOM PROCEDURE TRAY) ×2 IMPLANT
PAD ARMBOARD 7.5X6 YLW CONV (MISCELLANEOUS) ×4 IMPLANT
SUT PROLENE 6 0 CC (SUTURE) ×4 IMPLANT
SUT SILK 2 0 SH (SUTURE) IMPLANT
SUT VIC AB 3-0 SH 27 (SUTURE) ×2
SUT VIC AB 3-0 SH 27X BRD (SUTURE) ×1 IMPLANT
SUT VICRYL 4-0 PS2 18IN ABS (SUTURE) ×2 IMPLANT
TOWEL GREEN STERILE (TOWEL DISPOSABLE) ×2 IMPLANT
UNDERPAD 30X30 (UNDERPADS AND DIAPERS) ×2 IMPLANT
WATER STERILE IRR 1000ML POUR (IV SOLUTION) ×2 IMPLANT

## 2018-04-09 NOTE — Transfer of Care (Signed)
Immediate Anesthesia Transfer of Care Note  Patient: John Woodard  Procedure(s) Performed: FIRST STAGE BACILIC VEIN TRANSPOSITION LEFT ARM (Left Arm Upper)  Patient Location: PACU  Anesthesia Type:General  Level of Consciousness: drowsy  Airway & Oxygen Therapy: Patient spontaneously breathing with face mask oxygen  Post-op Assessment: Report given to RN and Post -op Vital signs reviewed and stable  Post vital signs: Reviewed and stable  Last Vitals:  Vitals Value Taken Time  BP 120/73 04/09/2018  2:40 PM  Temp    Pulse 70 04/09/2018  2:42 PM  Resp 12 04/09/2018  2:42 PM  SpO2 100 % 04/09/2018  2:42 PM  Vitals shown include unvalidated device data.  Last Pain:  Vitals:   04/09/18 0841  TempSrc: Oral  PainSc:       Patients Stated Pain Goal: 0 (88/71/95 9747)  Complications: No apparent anesthesia complications

## 2018-04-09 NOTE — Telephone Encounter (Signed)
sch appt phone NA mld ltr 05/24/18 1pm Dialysis Duplex 2pm p/o PA

## 2018-04-09 NOTE — Anesthesia Procedure Notes (Signed)
Procedure Name: LMA Insertion Date/Time: 04/09/2018 1:38 PM Performed by: Raenette Rover, CRNA Pre-anesthesia Checklist: Patient identified, Emergency Drugs available, Suction available and Patient being monitored Patient Re-evaluated:Patient Re-evaluated prior to induction Oxygen Delivery Method: Circle system utilized Preoxygenation: Pre-oxygenation with 100% oxygen Induction Type: IV induction LMA: LMA inserted LMA Size: 4.0 Number of attempts: 1 Placement Confirmation: positive ETCO2,  CO2 detector and breath sounds checked- equal and bilateral Tube secured with: Tape Dental Injury: Teeth and Oropharynx as per pre-operative assessment

## 2018-04-09 NOTE — Discharge Instructions (Signed)
[facility]  Discharge Instructions  AV Fistula or Graft Surgery for Dialysis Access  Please refer to the following instructions for your post-procedure care. Your surgeon or physician assistant will discuss any changes with you.  Activity  You may drive the day following your surgery, if you are comfortable and no longer taking prescription pain medication. Resume full activity as the soreness in your incision resolves.  Bathing/Showering  You may shower after you go home. Keep your incision dry for 48 hours. Do not soak in a bathtub, hot tub, or swim until the incision heals completely. You may not shower if you have a hemodialysis catheter.  Incision Care  Clean your incision with mild soap and water after 48 hours. Pat the area dry with a clean towel. You do not need a bandage unless otherwise instructed. Do not apply any ointments or creams to your incision. You may have skin glue on your incision. Do not peel it off. It will come off on its own in about one week. Your arm may swell a bit after surgery. To reduce swelling use pillows to elevate your arm so it is above your heart. Your doctor will tell you if you need to lightly wrap your arm with an ACE bandage.  Diet  Resume your normal diet. There are not special food restrictions following this procedure. In order to heal from your surgery, it is CRITICAL to get adequate nutrition. Your body requires vitamins, minerals, and protein. Vegetables are the best source of vitamins and minerals. Vegetables also provide the perfect balance of protein. Processed food has little nutritional value, so try to avoid this.  Medications  Resume taking all of your medications. If your incision is causing pain, you may take over-the counter pain relievers such as acetaminophen (Tylenol). If you were prescribed a stronger pain medication, please be aware these medications can cause nausea and constipation. Prevent  nausea by taking the medication with a snack or meal. Avoid constipation by drinking plenty of fluids and eating foods with high amount of fiber, such as fruits, vegetables, and grains. Do not take Tylenol if you are taking prescription pain medications.     Follow up Your surgeon may want to see you in the office following your access surgery. If so, this will be arranged at the time of your surgery.  Please call us immediately for any of the following conditions:  Increased pain, redness, drainage (pus) from your incision site Fever of 101 degrees or higher Severe or worsening pain at your incision site Hand pain or numbness.  Reduce your risk of vascular disease:  Stop smoking. If you would like help, call [facility] at 1-800-QUIT-NOW [phone]) or [place] at [place] your cholesterol Maintain a desired weight Control your diabetes Keep your blood pressure down  Dialysis  It will take several weeks to several months for your new dialysis access to be ready for use. Your surgeon will determine when it is OK to use it. Your nephrologist will continue to direct your dialysis. You can continue to use your Permcath until your new access is ready for use.  If you have any questions, please call the office at [phone].   Post Anesthesia Home Care Instructions  Activity: Get plenty of rest for the remainder of the day. A responsible individual must stay with you for 24 hours following the procedure.  For the next 24 hours, DO NOT: -Drive a car -Paediatric nurse -Drink alcoholic  beverages -Take any medication unless instructed by your physician -Make any legal decisions or sign important papers.  Meals: Start with liquid foods such as gelatin or soup. Progress to regular foods as tolerated. Avoid greasy, spicy, heavy foods. If nausea and/or vomiting occur, drink only clear liquids until the nausea and/or vomiting subsides. Call your physician if  vomiting continues.  Special Instructions/Symptoms: Your throat may feel dry or sore from the anesthesia or the breathing tube placed in your throat during surgery. If this causes discomfort, gargle with warm salt water. The discomfort should disappear within 24 hours.  If you had a scopolamine patch placed behind your ear for the management of post- operative nausea and/or vomiting:  1. The medication in the patch is effective for 72 hours, after which it should be removed.  Wrap patch in a tissue and discard in the trash. Wash hands thoroughly with soap and water. 2. You may remove the patch earlier than 72 hours if you experience unpleasant side effects which may include dry mouth, dizziness or visual disturbances. 3. Avoid touching the patch. Wash your hands with soap and water after contact with the patch.

## 2018-04-09 NOTE — Interval H&P Note (Signed)
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 After consideration of risks, benefits and other options for treatment, the patient has consented to  Procedure(s): [place] (Left) as a surgical intervention .  The patient's history has been reviewed, patient examined, no change in status, stable for surgery.  I have reviewed the patient's chart and labs.  Questions were answered to the patient's satisfaction.     [name]

## 2018-04-09 NOTE — Progress Notes (Signed)
Pt dressed & up in recliner chair-eating dinner, no c/o, VSS. Awaiting ride home from brother Alyssa Mancera who said he will be here after work, 8pm.

## 2018-04-10 NOTE — Op Note (Signed)
    Patient name: Markise Haymer MRN: [id number] DOB: [birth year] Sex: male  04/09/2018 Pre-operative Diagnosis: ESRD Post-operative diagnosis:  Same Surgeon:  [name] Assistants:  Nurse Procedure:   Left 1st stage basilic vein fistula Anesthesia:  General Blood Loss:  minimal Specimens:  none  Findings:  19mm vein, ectatic 6 mm brachial artery  Indications:  The patient is in need of new HD access  Procedure:  The patient was identified in the holding area and taken to [facility]  The patient was then placed supine on the table. general anesthesia was administered.  The patient was prepped and draped in the usual sterile fashion.  A time out was called and antibiotics were administered.  Ultrasound was used to evaluate the basilic vein.  This appeared to be a healthy 5 mm vein.  A transverse incision was made at the antecubital crease.  I first dissected the brachial artery.  This was an ectatic 6 mm artery.  Once this was exposed, I dissected out the basilic vein.  This was a generous appearing vein measuring about 5 mm.  It was fully mobilized and marked for orientation.  It was then ligated distally.  It distended nicely with heparin saline.  3000 units of heparin was administered.  The brachial artery was then occluded.  The vein was cut the appropriate length and spatulated to fit the size of the arteriotomy.  A running anastomosis was created with 6-0 Prolene.  Prior to completion the appropriate flushing maneuvers were performed and the anastomosis was completed.  There was an excellent thrill within the fistula.  There were good Doppler signals in the radial and ulnar artery.  Once hemostasis was satisfactory, the incision was closed with 2 layers of 3-0 Vicryl followed by Dermabond.   Disposition: To PACU stable   V. [name], M.D. [facility] [place] Office: [phone] Pager:  [phone]

## 2018-04-12 ENCOUNTER — Encounter (HOSPITAL_COMMUNITY): Payer: Self-pay | Admitting: Surgery

## 2018-04-12 NOTE — Anesthesia Postprocedure Evaluation (Signed)
Anesthesia Post Note  Patient: John Woodard  Procedure(s) Performed: FIRST STAGE BACILIC VEIN TRANSPOSITION LEFT ARM (Left Arm Upper)     Patient location during evaluation: PACU Anesthesia Type: General Level of consciousness: awake and alert Pain management: pain level controlled Vital Signs Assessment: post-procedure vital signs reviewed and stable Respiratory status: spontaneous breathing, nonlabored ventilation, respiratory function stable and patient connected to nasal cannula oxygen Cardiovascular status: blood pressure returned to baseline and stable Postop Assessment: no apparent nausea or vomiting Anesthetic complications: no    Last Vitals:  Vitals:   04/09/18 1758 04/09/18 1841  BP: (!) 144/88 (!) 157/92  Pulse: 76   Resp: 12 20  Temp:    SpO2: 100% 100%    Last Pain:  Vitals:   04/09/18 1841  TempSrc:   PainSc: 0-No pain   Pain Goal: Patients Stated Pain Goal: 0 (04/09/18 0819)               Lidia Collum

## 2018-04-21 ENCOUNTER — Other Ambulatory Visit: Payer: Self-pay

## 2018-04-21 DIAGNOSIS — N186 End stage renal disease: Secondary | ICD-10-CM

## 2018-04-21 DIAGNOSIS — Z992 Dependence on renal dialysis: Principal | ICD-10-CM

## 2018-05-24 ENCOUNTER — Ambulatory Visit (HOSPITAL_COMMUNITY)
Admission: RE | Admit: 2018-05-24 | Discharge: 2018-05-24 | Disposition: A | Discharge status: Home / Self Care | Payer: Medicare Other | Source: Ambulatory Visit | Attending: Surgery | Admitting: Surgery

## 2018-05-24 ENCOUNTER — Other Ambulatory Visit: Payer: Self-pay | Admitting: *Deleted

## 2018-05-24 ENCOUNTER — Encounter: Payer: Self-pay | Admitting: *Deleted

## 2018-05-24 ENCOUNTER — Ambulatory Visit (INDEPENDENT_AMBULATORY_CARE_PROVIDER_SITE_OTHER): Payer: Self-pay | Admitting: Physician Assistant

## 2018-05-24 VITALS — BP 138/94 | HR 74 | Temp 97.3°F | Ht 74.0 in | Wt 188.4 lb

## 2018-05-24 DIAGNOSIS — Z992 Dependence on renal dialysis: Secondary | ICD-10-CM

## 2018-05-24 DIAGNOSIS — N186 End stage renal disease: Secondary | ICD-10-CM | POA: Insufficient documentation

## 2018-05-24 NOTE — Progress Notes (Signed)
POST OPERATIVE OFFICE NOTE    CC:  F/u for surgery  HPI:  This is a 47 y.o. male who is s/p left 1st stage basilic vein transposition on 04/09/18 by Dr. Trula Slade.  He presents today for follow up.   He states he does not have pain in his hand.   He tells me that he has a dentist appointment on 06/02/18 for a hole in his gum.  He is very focused on this issue and I have to steer him back to his fistula.  He is currently dialyzing via a tunneled right IJ catheter on T/T/S at the location on [address].   Pt states he will have to work out transportation with his brother to pick him up after surgery.    Allergies  Allergen Reactions  . Naproxen Anaphylaxis and Other (See Comments)    "heart stopped one time"  . Lactose Intolerance (Gi) Diarrhea and Other (See Comments)    BLOODY STOOLS  . Penicillins     UNSPECIFIED REACTION     Current Outpatient Medications  Medication Sig Dispense Refill  . acetaminophen (TYLENOL) 500 MG tablet Take 500 mg by mouth every 6 (six) hours as needed for moderate pain.    Marland Kitchen amLODipine (NORVASC) 10 MG tablet Take 10 mg by mouth at bedtime.    . cinacalcet (SENSIPAR) 30 MG tablet Take 30 mg by mouth every Monday, Wednesday, and Friday.     . cloNIDine (CATAPRES) 0.3 MG tablet Take 1 tablet (0.3 mg total) by mouth 3 (three) times daily. 90 tablet 0  . doxazosin (CARDURA) 4 MG tablet Take 4 mg by mouth at bedtime.    Marland Kitchen esomeprazole (NEXIUM) 40 MG capsule Take 40 mg by mouth daily before breakfast.    . furosemide (LASIX) 40 MG tablet Take 40 mg by mouth daily.    Marland Kitchen HYDROcodone-acetaminophen (NORCO) 5-325 MG tablet Take 1 tablet by mouth every 4 (four) hours as needed for moderate pain. 10 tablet 0  . losartan (COZAAR) 100 MG tablet Take 100 mg by mouth daily.    . metoprolol succinate (TOPROL-XL) 50 MG 24 hr tablet Take 1 tablet (50 mg total) by mouth at bedtime. Take with or immediately following a meal. 30 tablet 0  . multivitamin (RENA-VIT) TABS tablet  Take 1 tablet by mouth daily.    . sevelamer carbonate (RENVELA) 800 MG tablet Take 800-1,600 mg by mouth See admin instructions. 1600mg  three times daily with meals and 800mg  with one snack.     No current facility-administered medications for this visit.      ROS:  See HPI  Physical Exam:  Today's Vitals   05/24/18 1404  BP: (!) 138/94  Pulse: 74  Temp: (!) 97.3 F (36.3 C)  TempSrc: Oral  SpO2: 100%  Weight: 188 lb 6.4 oz (85.5 kg)  Height: 6\' 2"  (1.88 m)   Body mass index is 24.19 kg/m.  Incision:  Healed nicely Extremities:  + palpable left radial pulse; excellent thrill within the fistula;    Dialysis duplex 05/24/18: Diameter:  0.53cm-0.91cm Depth:  0.38cm-0.73cm  Assessment/Plan:  This is a 47 y.o. male who is s/p: left 1st stage basilic vein transposition on 04/09/18 by Dr. Trula Slade  -pt's fistula is maturing quite nicely.  There is an excellent thrill in the fistula.  I discussed with the pt that he will need a second surgery to bring fistula closer to the skin as it is too deep to stick now.  He did express  understanding of this.  He has a dentist appointment on 06/02/18.  Will plan to schedule 2nd stage BVT after this on a M/W/F by Dr. Trula Slade.  He discussed transportation. He understands that he can take transportation to the hospital, but would be better if his brother could pick him up from the hospital after surgery.  He expressed understanding of this as well and said he would have to work this out.     Leontine Locket, PA-C [facility] [phone]  Clinic MD:  Trula Slade

## 2018-05-24 NOTE — H&P (View-Only) (Signed)
POST OPERATIVE OFFICE NOTE    CC:  F/u for surgery  HPI:  This is a 47 y.o. male who is s/p left 1st stage basilic vein transposition on 04/09/18 by Dr. Trula Slade.  He presents today for follow up.   He states he does not have pain in his hand.   He tells me that he has a dentist appointment on 06/02/18 for a hole in his gum.  He is very focused on this issue and I have to steer him back to his fistula.  He is currently dialyzing via a tunneled right IJ catheter on T/T/S at the location on [place].   Pt states he will have to work out transportation with his brother to pick him up after surgery.    Allergies  Allergen Reactions  . Naproxen Anaphylaxis and Other (See Comments)    "heart stopped one time"  . Lactose Intolerance (Gi) Diarrhea and Other (See Comments)    BLOODY STOOLS  . Penicillins     UNSPECIFIED REACTION     Current Outpatient Medications  Medication Sig Dispense Refill  . acetaminophen (TYLENOL) 500 MG tablet Take 500 mg by mouth every 6 (six) hours as needed for moderate pain.    Marland Kitchen amLODipine (NORVASC) 10 MG tablet Take 10 mg by mouth at bedtime.    . cinacalcet (SENSIPAR) 30 MG tablet Take 30 mg by mouth every Monday, Wednesday, and Friday.     . cloNIDine (CATAPRES) 0.3 MG tablet Take 1 tablet (0.3 mg total) by mouth 3 (three) times daily. 90 tablet 0  . doxazosin (CARDURA) 4 MG tablet Take 4 mg by mouth at bedtime.    Marland Kitchen esomeprazole (NEXIUM) 40 MG capsule Take 40 mg by mouth daily before breakfast.    . furosemide (LASIX) 40 MG tablet Take 40 mg by mouth daily.    Marland Kitchen HYDROcodone-acetaminophen (NORCO) 5-325 MG tablet Take 1 tablet by mouth every 4 (four) hours as needed for moderate pain. 10 tablet 0  . losartan (COZAAR) 100 MG tablet Take 100 mg by mouth daily.    . metoprolol succinate (TOPROL-XL) 50 MG 24 hr tablet Take 1 tablet (50 mg total) by mouth at bedtime. Take with or immediately following a meal. 30 tablet 0  . multivitamin (RENA-VIT) TABS tablet  Take 1 tablet by mouth daily.    . sevelamer carbonate (RENVELA) 800 MG tablet Take 800-1,600 mg by mouth See admin instructions. 1600mg  three times daily with meals and 800mg  with one snack.     No current facility-administered medications for this visit.      ROS:  See HPI  Physical Exam:  Today's Vitals   05/24/18 1404  BP: (!) 138/94  Pulse: 74  Temp: (!) 97.3 F (36.3 C)  TempSrc: Oral  SpO2: 100%  Weight: 188 lb 6.4 oz (85.5 kg)  Height: 6\' 2"  (1.88 m)   Body mass index is 24.19 kg/m.  Incision:  Healed nicely Extremities:  + palpable left radial pulse; excellent thrill within the fistula;    Dialysis duplex 05/24/18: Diameter:  0.53cm-0.91cm Depth:  0.38cm-0.73cm  Assessment/Plan:  This is a 47 y.o. male who is s/p: left 1st stage basilic vein transposition on 04/09/18 by Dr. Trula Slade  -pt's fistula is maturing quite nicely.  There is an excellent thrill in the fistula.  I discussed with the pt that he will need a second surgery to bring fistula closer to the skin as it is too deep to stick now.  He did express  understanding of this.  He has a dentist appointment on 06/02/18.  Will plan to schedule 2nd stage BVT after this on a M/W/F by Dr. Trula Slade.  He discussed transportation. He understands that he can take transportation to the hospital, but would be better if his brother could pick him up from the hospital after surgery.  He expressed understanding of this as well and said he would have to work this out.     Leontine Locket, PA-C [facility] [phone]  Clinic MD:  Trula Slade

## 2018-05-24 NOTE — Progress Notes (Signed)
Call to [facility] to inform patient of 6:30 am arrival for surgery on 06/09/18.

## 2018-06-07 NOTE — Progress Notes (Signed)
I spoke to patient's brother who we typically provide instructions to and give him preop instructions. I attempted to contact patient for medical history and patient was no available. Brother states he will not be able to pick patient up until 2000 due to work. Brother states patient may have to arrive early due to him having work.

## 2018-06-09 ENCOUNTER — Encounter (HOSPITAL_COMMUNITY): Payer: Self-pay | Admitting: Urology

## 2018-06-09 ENCOUNTER — Encounter (HOSPITAL_COMMUNITY)
Admission: RE | Disposition: A | Discharge status: Home / Self Care | Payer: Self-pay | Source: Ambulatory Visit | Attending: Surgery

## 2018-06-09 ENCOUNTER — Ambulatory Visit (HOSPITAL_COMMUNITY): Payer: Medicare Other | Admitting: Anesthesiology

## 2018-06-09 ENCOUNTER — Ambulatory Visit (HOSPITAL_COMMUNITY)
Admission: RE | Admit: 2018-06-09 | Discharge: 2018-06-09 | Disposition: A | Discharge status: Home / Self Care | Payer: Medicare Other | Source: Ambulatory Visit | Attending: Surgery | Admitting: Surgery

## 2018-06-09 ENCOUNTER — Other Ambulatory Visit: Payer: Self-pay

## 2018-06-09 DIAGNOSIS — N186 End stage renal disease: Secondary | ICD-10-CM | POA: Insufficient documentation

## 2018-06-09 DIAGNOSIS — Z880 Allergy status to penicillin: Secondary | ICD-10-CM | POA: Insufficient documentation

## 2018-06-09 DIAGNOSIS — N185 Chronic kidney disease, stage 5: Secondary | ICD-10-CM

## 2018-06-09 HISTORY — PX: FISTULA SUPERFICIALIZATION: SHX6341

## 2018-06-09 LAB — POCT I-STAT 4, (NA,K, GLUC, HGB,HCT)
GLUCOSE: 82 mg/dL (ref 70–99)
HCT: 36 % — ABNORMAL LOW (ref 39.0–52.0)
Hemoglobin: 12.2 g/dL — ABNORMAL LOW (ref 13.0–17.0)
POTASSIUM: 4.6 mmol/L (ref 3.5–5.1)
Sodium: 140 mmol/L (ref 135–145)

## 2018-06-09 SURGERY — FISTULA SUPERFICIALIZATION
Anesthesia: General | Site: Arm Upper | Laterality: Left

## 2018-06-09 MED ORDER — SODIUM CHLORIDE 0.9 % IV SOLN
INTRAVENOUS | Status: DC
  Administered 2018-06-09: 10:00:00 via INTRAVENOUS

## 2018-06-09 MED ORDER — HYDROMORPHONE HCL 1 MG/ML IJ SOLN
INTRAMUSCULAR | Status: AC
  Administered 2018-06-09: 0.5 mg via INTRAVENOUS
  Filled 2018-06-09: qty 1

## 2018-06-09 MED ORDER — SODIUM CHLORIDE 0.9 % IV SOLN
INTRAVENOUS | Status: AC
  Filled 2018-06-09: qty 1.2

## 2018-06-09 MED ORDER — HYDROMORPHONE HCL 1 MG/ML IJ SOLN
0.5000 mg | INTRAMUSCULAR | Status: DC | PRN
  Administered 2018-06-09 (×2): 0.5 mg via INTRAVENOUS

## 2018-06-09 MED ORDER — HYDROCODONE-ACETAMINOPHEN 5-325 MG PO TABS: 1.0000 | ORAL_TABLET | ORAL | 0 refills | Status: DC | PRN

## 2018-06-09 MED ORDER — 0.9 % SODIUM CHLORIDE (POUR BTL) OPTIME
TOPICAL | Status: DC | PRN
  Administered 2018-06-09: 1000 mL

## 2018-06-09 MED ORDER — LABETALOL HCL 5 MG/ML IV SOLN
5.0000 mg | INTRAVENOUS | Status: DC | PRN
  Administered 2018-06-09: 5 mg via INTRAVENOUS

## 2018-06-09 MED ORDER — MIDAZOLAM HCL 2 MG/2ML IJ SOLN
INTRAMUSCULAR | Status: DC | PRN
  Administered 2018-06-09: 1 mg via INTRAVENOUS

## 2018-06-09 MED ORDER — CHLORHEXIDINE GLUCONATE 4 % EX LIQD: 60.0000 mL | Freq: Once | CUTANEOUS | Status: DC

## 2018-06-09 MED ORDER — LIDOCAINE 2% (20 MG/ML) 5 ML SYRINGE
INTRAMUSCULAR | Status: DC | PRN
  Administered 2018-06-09: 100 mg via INTRAVENOUS

## 2018-06-09 MED ORDER — MIDAZOLAM HCL 2 MG/2ML IJ SOLN
INTRAMUSCULAR | Status: AC
  Filled 2018-06-09: qty 2

## 2018-06-09 MED ORDER — HYDROCODONE-ACETAMINOPHEN 5-325 MG PO TABS
ORAL_TABLET | ORAL | Status: AC
  Filled 2018-06-09: qty 1

## 2018-06-09 MED ORDER — VANCOMYCIN HCL IN DEXTROSE 1-5 GM/200ML-% IV SOLN
1000.0000 mg | INTRAVENOUS | Status: AC
  Administered 2018-06-09: 1000 mg via INTRAVENOUS
  Filled 2018-06-09: qty 200

## 2018-06-09 MED ORDER — LIDOCAINE-EPINEPHRINE (PF) 1 %-1:200000 IJ SOLN
INTRAMUSCULAR | Status: AC
  Filled 2018-06-09: qty 30

## 2018-06-09 MED ORDER — LABETALOL HCL 5 MG/ML IV SOLN
INTRAVENOUS | Status: AC
  Filled 2018-06-09: qty 4

## 2018-06-09 MED ORDER — HYDROCODONE-ACETAMINOPHEN 5-325 MG PO TABS: 1.0000 | ORAL_TABLET | ORAL | Status: DC | PRN

## 2018-06-09 MED ORDER — SODIUM CHLORIDE 0.9 % IV SOLN
INTRAVENOUS | Status: DC | PRN
  Administered 2018-06-09: 30 ug/min via INTRAVENOUS

## 2018-06-09 MED ORDER — ONDANSETRON HCL 4 MG/2ML IJ SOLN
INTRAMUSCULAR | Status: DC | PRN
  Administered 2018-06-09: 4 mg via INTRAVENOUS

## 2018-06-09 MED ORDER — ONDANSETRON HCL 4 MG/2ML IJ SOLN
INTRAMUSCULAR | Status: AC
  Filled 2018-06-09: qty 2

## 2018-06-09 MED ORDER — LIDOCAINE 2% (20 MG/ML) 5 ML SYRINGE
INTRAMUSCULAR | Status: AC
  Filled 2018-06-09: qty 5

## 2018-06-09 MED ORDER — FENTANYL CITRATE (PF) 250 MCG/5ML IJ SOLN
INTRAMUSCULAR | Status: DC | PRN
  Administered 2018-06-09: 25 ug via INTRAVENOUS
  Administered 2018-06-09: 100 ug via INTRAVENOUS

## 2018-06-09 MED ORDER — PROPOFOL 10 MG/ML IV BOLUS
INTRAVENOUS | Status: DC | PRN
  Administered 2018-06-09: 150 mg via INTRAVENOUS

## 2018-06-09 MED ORDER — DEXAMETHASONE SODIUM PHOSPHATE 10 MG/ML IJ SOLN
INTRAMUSCULAR | Status: AC
  Filled 2018-06-09: qty 1

## 2018-06-09 MED ORDER — DEXAMETHASONE SODIUM PHOSPHATE 10 MG/ML IJ SOLN
INTRAMUSCULAR | Status: DC | PRN
  Administered 2018-06-09: 5 mg via INTRAVENOUS

## 2018-06-09 MED ORDER — FENTANYL CITRATE (PF) 250 MCG/5ML IJ SOLN
INTRAMUSCULAR | Status: AC
  Filled 2018-06-09: qty 5

## 2018-06-09 MED ORDER — SODIUM CHLORIDE 0.9 % IV SOLN
INTRAVENOUS | Status: DC | PRN
  Administered 2018-06-09: 500 mL

## 2018-06-09 MED ORDER — HEMOSTATIC AGENTS (NO CHARGE) OPTIME
TOPICAL | Status: DC | PRN
  Administered 2018-06-09: 1 via TOPICAL

## 2018-06-09 SURGICAL SUPPLY — 31 items
ARMBAND PINK RESTRICT EXTREMIT (MISCELLANEOUS) ×3 IMPLANT
CANISTER SUCT 3000ML PPV (MISCELLANEOUS) ×3 IMPLANT
CLIP VESOCCLUDE MED 6/CT (CLIP) ×3 IMPLANT
CLIP VESOCCLUDE SM WIDE 6/CT (CLIP) ×3 IMPLANT
COVER PROBE W GEL 5X96 (DRAPES) ×3 IMPLANT
COVER WAND RF STERILE (DRAPES) ×3 IMPLANT
DERMABOND ADVANCED (GAUZE/BANDAGES/DRESSINGS) ×4
DERMABOND ADVANCED .7 DNX12 (GAUZE/BANDAGES/DRESSINGS) ×2 IMPLANT
ELECT REM PT RETURN 9FT ADLT (ELECTROSURGICAL) ×3
ELECTRODE REM PT RTRN 9FT ADLT (ELECTROSURGICAL) ×1 IMPLANT
GLOVE BIOGEL PI IND STRL 7.5 (GLOVE) ×2 IMPLANT
GLOVE BIOGEL PI INDICATOR 7.5 (GLOVE) ×4
GLOVE ECLIPSE 7.0 STRL STRAW (GLOVE) ×3 IMPLANT
GLOVE SURG SS PI 7.5 STRL IVOR (GLOVE) ×3 IMPLANT
GOWN STRL REUS W/ TWL LRG LVL3 (GOWN DISPOSABLE) ×2 IMPLANT
GOWN STRL REUS W/ TWL XL LVL3 (GOWN DISPOSABLE) ×1 IMPLANT
GOWN STRL REUS W/TWL LRG LVL3 (GOWN DISPOSABLE) ×4
GOWN STRL REUS W/TWL XL LVL3 (GOWN DISPOSABLE) ×2
HEMOSTAT SNOW SURGICEL 2X4 (HEMOSTASIS) ×3 IMPLANT
KIT BASIN OR (CUSTOM PROCEDURE TRAY) ×3 IMPLANT
KIT TURNOVER KIT B (KITS) ×3 IMPLANT
NS IRRIG 1000ML POUR BTL (IV SOLUTION) ×3 IMPLANT
PACK CV ACCESS (CUSTOM PROCEDURE TRAY) ×3 IMPLANT
PAD ARMBOARD 7.5X6 YLW CONV (MISCELLANEOUS) ×6 IMPLANT
SUT PROLENE 6 0 CC (SUTURE) ×6 IMPLANT
SUT VIC AB 3-0 SH 27 (SUTURE) ×6
SUT VIC AB 3-0 SH 27X BRD (SUTURE) ×3 IMPLANT
SUT VICRYL 4-0 PS2 18IN ABS (SUTURE) IMPLANT
TOWEL GREEN STERILE (TOWEL DISPOSABLE) ×3 IMPLANT
UNDERPAD 30X30 (UNDERPADS AND DIAPERS) ×3 IMPLANT
WATER STERILE IRR 1000ML POUR (IV SOLUTION) ×3 IMPLANT

## 2018-06-09 NOTE — Anesthesia Postprocedure Evaluation (Signed)
Anesthesia Post Note  Patient: John Woodard  Procedure(s) Performed: FISTULA SUPERFICIALIZATION LEFT UPPER ARM (Left Arm Upper)     Patient location during evaluation: PACU Anesthesia Type: General Level of consciousness: sedated Pain management: pain level controlled Vital Signs Assessment: post-procedure vital signs reviewed and stable Respiratory status: spontaneous breathing and respiratory function stable Cardiovascular status: stable Postop Assessment: no apparent nausea or vomiting Anesthetic complications: no    Last Vitals:  Vitals:   06/09/18 1415 06/09/18 1430  BP:    Pulse: 75 78  Resp: 11 12  Temp:    SpO2: 98% 100%    Last Pain:  Vitals:   06/09/18 1430  PainSc: Asleep                 [name]

## 2018-06-09 NOTE — Anesthesia Procedure Notes (Signed)
Procedure Name: LMA Insertion Date/Time: 06/09/2018 11:46 AM Performed by: Bryson Corona, CRNA Pre-anesthesia Checklist: Suction available, Patient identified, Emergency Drugs available and Patient being monitored Patient Re-evaluated:Patient Re-evaluated prior to induction Oxygen Delivery Method: Circle system utilized Preoxygenation: Pre-oxygenation with 100% oxygen Induction Type: IV induction Ventilation: Mask ventilation without difficulty LMA: LMA inserted LMA Size: 5.0 Number of attempts: 2 Placement Confirmation: positive ETCO2 and breath sounds checked- equal and bilateral Tube secured with: Tape Dental Injury: Teeth and Oropharynx as per pre-operative assessment

## 2018-06-09 NOTE — Anesthesia Preprocedure Evaluation (Addendum)
Anesthesia Evaluation  Patient identified by MRN, date of birth, ID band Patient awake    Reviewed: Allergy & Precautions, NPO status , Patient's Chart, lab work & pertinent test results, reviewed documented beta blocker date and time   History of Anesthesia Complications Negative for: history of anesthetic complications  Airway Mallampati: II  TM Distance: >3 FB Neck ROM: Full    Dental no notable dental hx. (+) Teeth Intact, Dental Advisory Given   Pulmonary shortness of breath,    Pulmonary exam normal breath sounds clear to auscultation       Cardiovascular hypertension, Pt. on home beta blockers and Pt. on medications Normal cardiovascular exam+ dysrhythmias Atrial Fibrillation  Rhythm:Regular Rate:Normal  Echo Sep 2018 Study Conclusions  - Left ventricle: The cavity size was normal. There was severe   concentric hypertrophy. Systolic function was vigorous. The   estimated ejection fraction was in the range of 65% to 70%. Wall   motion was normal; there were no regional wall motion   abnormalities. Features are consistent with a pseudonormal left   ventricular filling pattern, with concomitant abnormal relaxation   and increased filling pressure (grade 2 diastolic dysfunction). - Aortic valve: There was mild regurgitation. - Mitral valve: There was mild regurgitation. - Left atrium: The atrium was severely dilated. - Right atrium: The atrium was severely dilated. - Tricuspid valve: There was moderate regurgitation   Neuro/Psych Cognitive impairment with Psych overlaynegative neurological ROS     GI/Hepatic Neg liver ROS, GERD  Medicated and Controlled,  Endo/Other  negative endocrine ROS  Renal/GU ESRF and DialysisRenal disease Bladder dysfunction (suprapubic catheter)      Musculoskeletal negative musculoskeletal ROS (+)   Abdominal   Peds  Hematology negative hematology ROS (+) anemia ,   Anesthesia  Other Findings   Reproductive/Obstetrics                             Anesthesia Physical  Anesthesia Plan  ASA: IV  Anesthesia Plan: General   Post-op Pain Management:    Induction: Intravenous  PONV Risk Score and Plan: 3 and Ondansetron, Dexamethasone and Treatment may vary due to age or medical condition  Airway Management Planned: LMA  Additional Equipment:   Intra-op Plan:   Post-operative Plan: Extubation in OR  Informed Consent: I have reviewed the patients History and Physical, chart, labs and discussed the procedure including the risks, benefits and alternatives for the proposed anesthesia with the patient or authorized representative who has indicated his/her understanding and acceptance.   Dental advisory given  Plan Discussed with: CRNA and Anesthesiologist  Anesthesia Plan Comments:        Anesthesia Quick Evaluation

## 2018-06-09 NOTE — Interval H&P Note (Signed)
History and Physical Interval Note:  06/09/2018 10:53 AM  John Woodard  has presented today for surgery, with the diagnosis of COMPLICATION WITH FISTULA LEFT ARM  The various methods of treatment have been discussed with the patient and family. After consideration of risks, benefits and other options for treatment, the patient has consented to  Procedure(s): FISTULA SUPERFICIALIZATION LEFT ARM (Left) as a surgical intervention .  The patient's history has been reviewed, patient examined, no change in status, stable for surgery.  I have reviewed the patient's chart and labs.  Questions were answered to the patient's satisfaction.     [name]

## 2018-06-09 NOTE — Op Note (Signed)
    Patient name: John Woodard MRN: [id number] DOB: 07-15-1971 Sex: male  06/09/2018 Pre-operative Diagnosis: End-stage renal disease Post-operative diagnosis:  Same Surgeon:  [name] Assistants: Laurence Slate Procedure:   Second stage basilic vein fistula creation Anesthesia: General Blood Loss: Minimal Specimens:  none  Findings: Excellent caliber vein, greater than 6 mm throughout  Indications: The patient comes in today for second stage basilic vein creation.  He does not have any symptoms of steal syndrome.  Preoperative ultrasound revealed an excellent caliber vein.  Procedure:  The patient was identified in the holding area and taken to [address]  The patient was then placed supine on the table. general anesthesia was administered.  The patient was prepped and draped in the usual sterile fashion.  A time out was called and antibiotics were administered.  Ultrasound was used to map the course of the basilic vein in the upper arm.  2 longitudinal incisions were made in the upper arm.  Through these incisions the basilic vein was circumferentially exposed and isolated.  The sensory nerve was protected throughout.  Side branches were ligated between silk ties.  Once the vein was fully isolated, it was marked for orientation.  I then used a curved tunneler to create a subcutaneous tunnel.  The vein was then occluded with vascular clamps.  It was transected near the antecubital crease.  The vein was then brought through the previously created tunnel making sure to maintain proper orientation.  A primary end to end anastomosis between the 2 cut ends of the vein was performed with running 6-0 Prolene.  Prior to completion, the appropriate flushing maneuvers were performed and the anastomosis was completed.  There was an excellent palpable thrill within the fistula and there were brisk Doppler signals in the radial and ulnar artery.  The wound was then irrigated and hemostasis was achieved.   The subcutaneous tissue was then closed with running 3-0 Vicryl and the skin was closed with 4-0 Vicryl followed by Dermabond.  There were no immediate complications.   Disposition: To PACU stable   V. [name], M.D. [facility] [place] Office: [phone] Pager:  [phone]

## 2018-06-09 NOTE — Progress Notes (Signed)
Pt is A/Ox4 when assessed today. Pt states he can hear and see, but it is noted in chart that he reads lips. Pt was able to answer questions appropriately when asked looking straight at the patient and speaking slowly (having to repeat myself a couple times). Some questions were not understood by patient and charted appropriately. Pt stated what procedure he is having today and why he needs this procedure. Pt also states he does not feel sick today and is not hurting. Pt states he is unable to brush his teeth d/t a loose tooth that hurts him.   I spoke with brother John Woodard who stated he will be there to pick pt up after work tonight. Pt will have an adult with him at home tonight as well. Per John Woodard, pt can sign his own consent form if he feels comfortable doing so.   Dr. Tobias Alexander notified that pt has an adult to stay with him as well as pt cognitive ability.

## 2018-06-09 NOTE — Progress Notes (Signed)
Called and spoke with Amparo Bristol (brother), he is unable to pick his brother up until 830pm. No one else is available to pick him up.  Rowe Pavy, RN

## 2018-06-09 NOTE — Discharge Instructions (Signed)
° °  [facility] ° °Discharge Instructions ° °AV Fistula or Graft Surgery for Dialysis Access ° °Please refer to the following instructions for your post-procedure care. Your surgeon or physician assistant will discuss any changes with you. ° °Activity ° °You may drive the day following your surgery, if you are comfortable and no longer taking prescription pain medication. Resume full activity as the soreness in your incision resolves. ° °Bathing/Showering ° °You may shower after you go home. Keep your incision dry for 48 hours. Do not soak in a bathtub, hot tub, or swim until the incision heals completely. You may not shower if you have a hemodialysis catheter. ° °Incision Care ° °Clean your incision with mild soap and water after 48 hours. Pat the area dry with a clean towel. You do not need a bandage unless otherwise instructed. Do not apply any ointments or creams to your incision. You may have skin glue on your incision. Do not peel it off. It will come off on its own in about one week. Your arm may swell a bit after surgery. To reduce swelling use pillows to elevate your arm so it is above your heart. Your doctor will tell you if you need to lightly wrap your arm with an ACE bandage. ° °Diet ° °Resume your normal diet. There are not special food restrictions following this procedure. In order to heal from your surgery, it is CRITICAL to get adequate nutrition. Your body requires vitamins, minerals, and protein. Vegetables are the best source of vitamins and minerals. Vegetables also provide the perfect balance of protein. Processed food has little nutritional value, so try to avoid this. ° °Medications ° °Resume taking all of your medications. If your incision is causing pain, you may take over-the counter pain relievers such as acetaminophen (Tylenol). If you were prescribed a stronger pain medication, please be aware these medications can cause nausea and constipation. Prevent  nausea by taking the medication with a snack or meal. Avoid constipation by drinking plenty of fluids and eating foods with high amount of fiber, such as fruits, vegetables, and grains. Do not take Tylenol if you are taking prescription pain medications. ° ° ° ° °Follow up °Your surgeon may want to see you in the office following your access surgery. If so, this will be arranged at the time of your surgery. ° °Please call us immediately for any of the following conditions: ° °Increased pain, redness, drainage (pus) from your incision site °Fever of 101 degrees or higher °Severe or worsening pain at your incision site °Hand pain or numbness. ° °Reduce your risk of vascular disease: ° °Stop smoking. If you would like help, call [facility] at 1-800-QUIT-NOW (1-800-784-8669) or [facility] at 336-586-4000 ° °Manage your cholesterol °Maintain a desired weight °Control your diabetes °Keep your blood pressure down ° °Dialysis ° °It will take several weeks to several months for your new dialysis access to be ready for use. Your surgeon will determine when it is OK to use it. Your nephrologist will continue to direct your dialysis. You can continue to use your Permcath until your new access is ready for use. ° °If you have any questions, please call the office at [phone]. ° °

## 2018-06-09 NOTE — Transfer of Care (Signed)
Immediate Anesthesia Transfer of Care Note  Patient: John Woodard  Procedure(s) Performed: FISTULA SUPERFICIALIZATION LEFT UPPER ARM (Left Arm Upper)  Patient Location: PACU  Anesthesia Type:General  Level of Consciousness: awake and alert   Airway & Oxygen Therapy: Patient Spontanous Breathing and Patient connected to face mask oxygen  Post-op Assessment: Report given to RN and Post -op Vital signs reviewed and stable  Post vital signs: Reviewed and stable  Last Vitals:  Vitals Value Taken Time  BP 144/109 06/09/2018  1:09 PM  Temp    Pulse 62 06/09/2018  1:11 PM  Resp 16 06/09/2018  1:11 PM  SpO2 95 % 06/09/2018  1:11 PM  Vitals shown include unvalidated device data.  Last Pain: There were no vitals filed for this visit.    Patients Stated Pain Goal: 0 (09/09/24 6916)  Complications: No apparent anesthesia complications

## 2018-06-10 ENCOUNTER — Encounter (HOSPITAL_COMMUNITY): Payer: Self-pay | Admitting: Surgery

## 2018-06-10 ENCOUNTER — Telehealth: Payer: Self-pay | Admitting: Surgery

## 2018-06-10 NOTE — Telephone Encounter (Signed)
-----   Message from Mena Goes, RN sent at 06/09/2018  2:25 PM EST ----- Regarding: 3-4 weeks in PA clinic   ----- Message ----- From: Ulyses Amor, PA-C Sent: 06/09/2018  12:59 PM EST To: [facility] Admin [facility], [facility] Charge [facility]  S/P left second stage basilic fistula f/u in [facility] clinic on a Monday in 3-4 weeks.

## 2018-06-10 NOTE — Telephone Encounter (Signed)
sch appt spk to pt bro 07/05/18 1pm p/o PA

## 2018-07-02 NOTE — Progress Notes (Signed)
POST OPERATIVE OFFICE NOTE    CC:  F/u for surgery  HPI:  This is a 47 y.o. male who is s/p 2nd stage left basilic vein transposition on 06/09/18 by Dr. Trula Slade.  He underwent the 1st stage on 04/09/18 also by Dr. Trula Slade.  He presents today for follow up.  He states that he had some irritation around the incisions and arm after surgery but this has improved.  He denies any pain or numbness in his left hand.    He dialyzes T/T/S at the center on [address].    Allergies  Allergen Reactions  . Naproxen Anaphylaxis and Other (See Comments)    CARDIAC ARREST "heart stopped one time"  . Lactose Intolerance (Gi) Diarrhea and Other (See Comments)    BLOODY STOOLS  . Penicillins     UNSPECIFIED REACTION     Current Outpatient Medications  Medication Sig Dispense Refill  . acetaminophen (TYLENOL) 325 MG tablet Take 325 mg by mouth every 6 (six) hours as needed for moderate pain.     Marland Kitchen amLODipine (NORVASC) 10 MG tablet Take 10 mg by mouth at bedtime.    . cinacalcet (SENSIPAR) 30 MG tablet Take 30 mg by mouth every Monday, Wednesday, and Friday.     . cloNIDine (CATAPRES) 0.3 MG tablet Take 1 tablet (0.3 mg total) by mouth 3 (three) times daily. 90 tablet 0  . docusate sodium (COLACE) 100 MG capsule Take 100 mg by mouth 2 (two) times daily as needed for constipation.    Marland Kitchen doxazosin (CARDURA) 4 MG tablet Take 4 mg by mouth at bedtime.    Marland Kitchen esomeprazole (NEXIUM) 40 MG capsule Take 40 mg by mouth daily before breakfast.    . furosemide (LASIX) 40 MG tablet Take 40 mg by mouth daily.    Marland Kitchen HYDROcodone-acetaminophen (NORCO) 5-325 MG tablet Take 1 tablet by mouth every 4 (four) hours as needed for moderate pain. 15 tablet 0  . losartan (COZAAR) 100 MG tablet Take 100 mg by mouth daily.    . metoprolol succinate (TOPROL-XL) 50 MG 24 hr tablet Take 1 tablet (50 mg total) by mouth at bedtime. Take with or immediately following a meal. 30 tablet 0  . multivitamin (RENA-VIT) TABS tablet Take 1  tablet by mouth daily.    . sevelamer carbonate (RENVELA) 800 MG tablet Take 800-1,600 mg by mouth See admin instructions. 1600mg  three times daily with meals and 800mg  with one snack.     No current facility-administered medications for this visit.      ROS:  See HPI  Physical Exam:  Incision:  Healing nicely Extremities:  Easily palpable left radial pulse.  Good grip left hand and sensation is intact.  The fistula is easily palpable and has an excellent thrill and bruit.    Assessment/Plan:  This is a 47 y.o. male who is s/p: 2nd stage left basilic vein transposition on 06/09/18 by Dr. Trula Slade.  He underwent the 1st stage on 04/09/18 also by Dr. Trula Slade.  -pt's fistula is easily palpable and has an excellent thrill and bruit.  -may start using fistula at next dialysis session.  Pt feels he can't use the fistula until his wound have completely healed, however, his incisions are healing nicely and the fistula is not near his incisions and is okay to stick.  I reiterated  this to the pt.  -he will f/u as needed.    Leontine Locket, PA-C [facility] [phone]  Clinic MD:  None

## 2018-07-05 ENCOUNTER — Ambulatory Visit (INDEPENDENT_AMBULATORY_CARE_PROVIDER_SITE_OTHER): Payer: Self-pay | Admitting: Physician Assistant

## 2018-07-05 DIAGNOSIS — Z992 Dependence on renal dialysis: Secondary | ICD-10-CM

## 2018-07-05 DIAGNOSIS — N186 End stage renal disease: Secondary | ICD-10-CM

## 2019-07-21 ENCOUNTER — Emergency Department (HOSPITAL_COMMUNITY): Payer: Medicare Other

## 2019-07-21 ENCOUNTER — Other Ambulatory Visit: Payer: Self-pay

## 2019-07-21 ENCOUNTER — Encounter (HOSPITAL_COMMUNITY): Payer: Self-pay

## 2019-07-21 ENCOUNTER — Emergency Department (HOSPITAL_COMMUNITY)
Admission: EM | Admit: 2019-07-21 | Discharge: 2019-07-21 | Disposition: A | Discharge status: Home / Self Care | Payer: Medicare Other | Attending: Emergency Medicine | Admitting: Emergency Medicine

## 2019-07-21 DIAGNOSIS — N186 End stage renal disease: Secondary | ICD-10-CM | POA: Diagnosis not present

## 2019-07-21 DIAGNOSIS — Z992 Dependence on renal dialysis: Secondary | ICD-10-CM | POA: Insufficient documentation

## 2019-07-21 DIAGNOSIS — R55 Syncope and collapse: Secondary | ICD-10-CM | POA: Insufficient documentation

## 2019-07-21 DIAGNOSIS — I120 Hypertensive chronic kidney disease with stage 5 chronic kidney disease or end stage renal disease: Secondary | ICD-10-CM | POA: Insufficient documentation

## 2019-07-21 DIAGNOSIS — Z79899 Other long term (current) drug therapy: Secondary | ICD-10-CM | POA: Diagnosis not present

## 2019-07-21 LAB — HEPATIC FUNCTION PANEL
ALT: 22 U/L (ref 0–44)
AST: 28 U/L (ref 15–41)
Albumin: 3.7 g/dL (ref 3.5–5.0)
Alkaline Phosphatase: 47 U/L (ref 38–126)
Bilirubin, Direct: 0.1 mg/dL (ref 0.0–0.2)
Indirect Bilirubin: 0.5 mg/dL (ref 0.3–0.9)
Total Bilirubin: 0.6 mg/dL (ref 0.3–1.2)
Total Protein: 6.9 g/dL (ref 6.5–8.1)

## 2019-07-21 LAB — CBC
HCT: 29.3 % — ABNORMAL LOW (ref 39.0–52.0)
Hemoglobin: 9 g/dL — ABNORMAL LOW (ref 13.0–17.0)
MCH: 27.7 pg (ref 26.0–34.0)
MCHC: 30.7 g/dL (ref 30.0–36.0)
MCV: 90.2 fL (ref 80.0–100.0)
Platelets: 80 10*3/uL — ABNORMAL LOW (ref 150–400)
RBC: 3.25 MIL/uL — ABNORMAL LOW (ref 4.22–5.81)
RDW: 15.9 % — ABNORMAL HIGH (ref 11.5–15.5)
WBC: 3.7 10*3/uL — ABNORMAL LOW (ref 4.0–10.5)
nRBC: 0.5 % — ABNORMAL HIGH (ref 0.0–0.2)

## 2019-07-21 LAB — BASIC METABOLIC PANEL
Anion gap: 17 — ABNORMAL HIGH (ref 5–15)
BUN: 66 mg/dL — ABNORMAL HIGH (ref 6–20)
CO2: 28 mmol/L (ref 22–32)
Calcium: 9.4 mg/dL (ref 8.9–10.3)
Chloride: 96 mmol/L — ABNORMAL LOW (ref 98–111)
Creatinine, Ser: 14.27 mg/dL — ABNORMAL HIGH (ref 0.61–1.24)
GFR calc Af Amer: 4 mL/min — ABNORMAL LOW (ref >60)
GFR calc non Af Amer: 4 mL/min — ABNORMAL LOW (ref >60)
Glucose, Bld: 93 mg/dL (ref 70–99)
Potassium: 5.3 mmol/L — ABNORMAL HIGH (ref 3.5–5.1)
Sodium: 141 mmol/L (ref 135–145)

## 2019-07-21 LAB — CBG MONITORING, ED: Glucose-Capillary: 82 mg/dL (ref 70–99)

## 2019-07-21 LAB — TROPONIN I (HIGH SENSITIVITY)
Troponin I (High Sensitivity): 22 ng/L — ABNORMAL HIGH (ref <18)
Troponin I (High Sensitivity): 19 ng/L — ABNORMAL HIGH (ref <18)

## 2019-07-21 MED ORDER — SODIUM CHLORIDE 0.9% FLUSH: 3.0000 mL | Freq: Once | INTRAVENOUS | Status: DC

## 2019-07-21 MED ORDER — CLONIDINE HCL 0.1 MG PO TABS
0.1000 mg | ORAL_TABLET | Freq: Once | ORAL | Status: AC
  Administered 2019-07-21: 0.1 mg via ORAL
  Filled 2019-07-21: qty 1

## 2019-07-21 NOTE — Discharge Instructions (Addendum)
Go back to dialysis as scheduled on Saturday given you missed your dialysis today.   If you begin feeling short of breath or have chest pain before you go back to dialysis you will need to return to the ED IMMEDIATELY.

## 2019-07-21 NOTE — ED Provider Notes (Signed)
Care assumed from [facility], [place] at shift change.  See her note for full H&P.  Per her note, "LEVEL 5 CAVEAT - POOR HISTORIAN  John Woodard is a 48 y.o. male with PMHx a fib, ESRD on dialysis T Th S, HTN who presents to the ED for syncopal episode. Pt poor historian and unable to provide much information. Per triage report pt from the kidney center for syncopal episode. He did not receive his treatment today. Triage report does state that pt is having episodes of unresponsiveness in the middle of answering questions although reports that these episodes are his baseline.   Pt states he had just used the restroom when he felt lightheaded and passed out. He is unsure if he hit his head. He denies pain anywhere currently. Unable to give any additional information at this time as he goes off on long tangents that are unrelated.   The history is provided by the patient, medical records and the EMS personnel. "   Physical Exam  BP (!) 185/118   Pulse 81   Temp 98 F (36.7 C) (Oral)   Resp 13   SpO2 100%   Physical Exam Vitals and nursing note reviewed.  Constitutional:      General: He is not in acute distress.    Appearance: He is well-developed. He is not ill-appearing.  HENT:     Head: Normocephalic and atraumatic.  Eyes:     Conjunctiva/sclera: Conjunctivae normal.  Cardiovascular:     Rate and Rhythm: Normal rate.  Pulmonary:     Effort: Pulmonary effort is normal.  Skin:    General: Skin is warm and dry.  Neurological:     Mental Status: He is alert and oriented to person, place, and time.       ED Course/Procedures   Clinical Course as of Jul 21 2039  Thu Jul 21, 2019  1410 Hx of pancytopenia  CBC(!) [MV]    Clinical Course User Index [MV] Eustaquio Maize, PA-C    Procedures  Results for orders placed or performed during the hospital encounter of [id number]  Basic metabolic panel  Result Value Ref Range   Sodium 141 135 - 145 mmol/L   Potassium 5.3 (H)  3.5 - 5.1 mmol/L   Chloride 96 (L) 98 - 111 mmol/L   CO2 28 22 - 32 mmol/L   Glucose, Bld 93 70 - 99 mg/dL   BUN 66 (H) 6 - 20 mg/dL   Creatinine, Ser 14.27 (H) 0.61 - 1.24 mg/dL   Calcium 9.4 8.9 - 10.3 mg/dL   GFR calc non Af Amer 4 (L) >60 mL/min   GFR calc Af Amer 4 (L) >60 mL/min   Anion gap 17 (H) 5 - 15  CBC  Result Value Ref Range   WBC 3.7 (L) 4.0 - 10.5 K/uL   RBC 3.25 (L) 4.22 - 5.81 MIL/uL   Hemoglobin 9.0 (L) 13.0 - 17.0 g/dL   HCT 29.3 (L) 39.0 - 52.0 %   MCV 90.2 80.0 - 100.0 fL   MCH 27.7 26.0 - 34.0 pg   MCHC 30.7 30.0 - 36.0 g/dL   RDW 15.9 (H) 11.5 - 15.5 %   Platelets 80 (L) 150 - 400 K/uL   nRBC 0.5 (H) 0.0 - 0.2 %  Hepatic function panel  Result Value Ref Range   Total Protein 6.9 6.5 - 8.1 g/dL   Albumin 3.7 3.5 - 5.0 g/dL   AST 28 15 - 41 U/L  ALT 22 0 - 44 U/L   Alkaline Phosphatase 47 38 - 126 U/L   Total Bilirubin 0.6 0.3 - 1.2 mg/dL   Bilirubin, Direct 0.1 0.0 - 0.2 mg/dL   Indirect Bilirubin 0.5 0.3 - 0.9 mg/dL  CBG monitoring, ED  Result Value Ref Range   Glucose-Capillary 82 70 - 99 mg/dL  Troponin I (High Sensitivity)  Result Value Ref Range   Troponin I (High Sensitivity) 22 (H) <18 ng/L  Troponin I (High Sensitivity)  Result Value Ref Range   Troponin I (High Sensitivity) 19 (H) <18 ng/L   DG Chest 2 View  Result Date: 07/21/2019 CLINICAL DATA:  Pt brought in by [facility] from the kidney center for syncopal episodes. Pt did not receive his tx today, per EMS pt has episode prior to being dialyzed. EXAM: CHEST - 2 VIEW COMPARISON:  Chest radiograph 03/28/2017 FINDINGS: Stable cardiomediastinal contours. The heart size is enlarged. Central venous congestion. No new focal infiltrate. No pneumothorax or large pleural effusion. No acute finding in the visualized skeleton. IMPRESSION: Cardiomegaly. Central venous congestion. No evidence of active disease. Electronically Signed   By: Audie Pinto M.D.   On: 07/21/2019 14:54   CT Head Wo  Contrast  Result Date: 07/21/2019 CLINICAL DATA:  Syncope, hypertension EXAM: CT HEAD WITHOUT CONTRAST TECHNIQUE: Contiguous axial images were obtained from the base of the skull through the vertex without intravenous contrast. COMPARISON:  12/06/2005 FINDINGS: Brain: No evidence of acute infarction, hemorrhage, hydrocephalus, extra-axial collection or mass lesion/mass effect. Mild cerebral volume loss. Vascular: Mild atherosclerotic calcifications involving the large vessels of the skull base. No unexpected hyperdense vessel. Skull: Normal. Negative for fracture or focal lesion. Sinuses/Orbits: Paranasal sinuses are clear except for a mucous retention cyst versus sinonasal polyp within the left maxillary sinus. Orbital structures within normal limits. Other: None. IMPRESSION: No acute intracranial findings. Electronically Signed   By: Davina Poke D.O.   On: 07/21/2019 15:23    EKG Interpretation  Date/Time:  Thursday July 21 2019 15:33:38 EST Ventricular Rate:  75 PR Interval:    QRS Duration: 91 QT Interval:  416 QTC Calculation: 465 R Axis:   -54 Text Interpretation: Atrial fibrillation Ventricular premature complex Left anterior fascicular block ST elev, probable normal early repol pattern When compared to prior, similar a flutter. No STEMI Confirmed by Antony Blackbird [phone]) on 07/21/2019 3:56:57 PM         MDM   48 year old male presenting for evaluation of syncopal episode that occurred while at dialysis earlier today.  CBC with pancytopenia which patient has history of BMP with mildly elevated potassium, elevated BUN/creatinine consistent with history of dialysis. Liver enzymes normal Initial troponin marginally elevated at 22, delta troponin is downtrending.    - Very low suspicion for ACS event  EKG with Atrial fibrillation Ventricular premature complex Left anterior fascicular block ST elev, probable normal early repol pattern When compared to prior, similar a  flutter. No STEMI   CXR Cardiomegaly. Central venous congestion. No evidence of active disease.   CT head with  No acute intracranial findings.   At shift change, patient pending delta troponin.  Repeat troponin downtrending.  I doubt that this episode was related to cardiac cause.  Based on history, seems more consistent with vasovagal episode.  Patient has been monitored in the emergency department for over 8 hours and has remained stable.  No indication for emergent dialysis at this time.  I feel that he is appropriate for follow-up with his PCP  with strict return precautions.  Patient discharged in stable condition.   Bishop Dublin 07/21/19 2040    Tegeler, Gwenyth Allegra, MD 07/21/19 2115

## 2019-07-21 NOTE — ED Provider Notes (Addendum)
[name] EMERGENCY DEPARTMENT Provider Note   CSN: [id number] Arrival date & time: 07/21/19  1224     History Chief Complaint  Patient presents with  . Loss of Consciousness   LEVEL 5 CAVEAT - POOR HISTORIAN  John Woodard is a 48 y.o. male with PMHx a fib, ESRD on dialysis T Th S, HTN who presents to the ED for syncopal episode. Pt poor historian and unable to provide much information. Per triage report pt from the kidney center for syncopal episode. He did not receive his treatment today. Triage report does state that pt is having episodes of unresponsiveness in the middle of answering questions although reports that these episodes are his baseline.   Pt states he had just used the restroom when he felt lightheaded and passed out. He is unsure if he hit his head. He denies pain anywhere currently. Unable to give any additional information at this time as he goes off on long tangents that are unrelated.   The history is provided by the patient, medical records and the EMS personnel.       Past Medical History:  Diagnosis Date  . Atrial fibrillation ([place])   . Dialysis patient ([place])   . Dyspnea   . ESRD on dialysis [facility])    [place]  . Hypertension   . Poor historian   . Renal disorder     Patient Active Problem List   Diagnosis Date Noted  . Hyperkalemia 03/28/2017  . Uremia 03/28/2017  . Pancytopenia ([place]) 03/28/2017  . GERD (gastroesophageal reflux disease) 03/28/2017  . Hypertension   . ESRD on dialysis [facility])     Past Surgical History:  Procedure Laterality Date  . A/V SHUNTOGRAM Left 04/03/2017   Procedure: A/V Shuntogram;  Surgeon: Elam Dutch, MD;  Location: [place] CV LAB;  Service: Cardiovascular;  Laterality: Left;  . AVF Left   . BASCILIC VEIN TRANSPOSITION Left 04/09/2018   Procedure: FIRST STAGE BACILIC VEIN TRANSPOSITION LEFT ARM;  Surgeon: Serafina Mitchell, MD;  Location: [place];  Service: Vascular;  Laterality:  Left;  . FISTULA SUPERFICIALIZATION Left 06/09/2018   Procedure: FISTULA SUPERFICIALIZATION LEFT UPPER ARM;  Surgeon: Serafina Mitchell, MD;  Location: [place];  Service: Vascular;  Laterality: Left;  . FISTULOGRAM Left 07/16/2017   Procedure: FISTULOGRAM LEFT ARM ARTERIOVENOUS FISTULA;  Surgeon: Serafina Mitchell, MD;  Location: [facility] OR;  Service: Vascular;  Laterality: Left;  . PERIPHERAL VASCULAR BALLOON ANGIOPLASTY Left 04/03/2017   Procedure: PERIPHERAL VASCULAR BALLOON ANGIOPLASTY;  Surgeon: Elam Dutch, MD;  Location: [place] CV LAB;  Service: Cardiovascular;  Laterality: Left;  Arm AV fistula  . REVISON OF ARTERIOVENOUS FISTULA Left [id number]   Procedure: PLICATION  OF ARTERIOVENOUS FISTULA  LEFT ARM;  Surgeon: Serafina Mitchell, MD;  Location: [place];  Service: Vascular;  Laterality: Left;  Marland Kitchen VASCULAR SURGERY         Family History  Problem Relation Age of Onset  . Hypertension Mother   . Diabetes Mellitus II Sister     Social History   Tobacco Use  . Smoking status: Never Smoker  . Smokeless tobacco: Never Used  Substance Use Topics  . Alcohol use: No  . Drug use: No    Home Medications Prior to Admission medications   Medication Sig Start Date End Date Taking? Authorizing Provider  acetaminophen (TYLENOL) 325 MG tablet Take 325 mg by mouth every 6 (six) hours as needed for moderate pain.  [provider]  amLODipine (NORVASC) 10 MG tablet Take 10 mg by mouth at bedtime.    [provider]  cinacalcet (SENSIPAR) 30 MG tablet Take 30 mg by mouth every Monday, Wednesday, and Friday.     [provider]  cloNIDine (CATAPRES) 0.3 MG tablet Take 1 tablet (0.3 mg total) by mouth 3 (three) times daily. 04/07/17   Aline August, MD  docusate sodium (COLACE) 100 MG capsule Take 100 mg by mouth 2 (two) times daily as needed for constipation.    [provider]  doxazosin (CARDURA) 4 MG tablet Take 4 mg by mouth at bedtime.    [provider]  esomeprazole (NEXIUM) 40 MG capsule Take 40 mg by mouth daily before breakfast.    [provider]  furosemide (LASIX) 40 MG tablet Take 40 mg by mouth daily.    [provider]  HYDROcodone-acetaminophen (NORCO) 5-325 MG tablet Take 1 tablet by mouth every 4 (four) hours as needed for moderate pain. 06/09/18   Ulyses Amor, PA-C  losartan (COZAAR) 100 MG tablet Take 100 mg by mouth daily.    [provider]  metoprolol succinate (TOPROL-XL) 50 MG 24 hr tablet Take 1 tablet (50 mg total) by mouth at bedtime. Take with or immediately following a meal. 04/07/17   Aline August, MD  multivitamin (RENA-VIT) TABS tablet Take 1 tablet by mouth daily.    [provider]  sevelamer carbonate (RENVELA) 800 MG tablet Take 800-1,600 mg by mouth See admin instructions. 1600mg  three times daily with meals and 800mg  with one snack.    [provider]    Allergies    Naproxen, Lactose intolerance (gi), and Penicillins  Review of Systems   Review of Systems  Unable to perform ROS: Other  Constitutional: Negative for fever.  Neurological: Positive for syncope.    Physical Exam Updated Vital Signs BP (!) 178/118   Pulse 69   Temp 98 F (36.7 C) (Oral)   Resp 13   SpO2 100%   Physical Exam Vitals and nursing note reviewed.  Constitutional:      Appearance: He is not ill-appearing or diaphoretic.  HENT:     Head: Normocephalic and atraumatic.     Comments: No raccoon's sign or battle's sign. Negative hemotympanum bilaterally.     Right Ear: Tympanic membrane normal.     Left Ear: Tympanic membrane normal.  Eyes:     Extraocular Movements: Extraocular movements intact.     Conjunctiva/sclera: Conjunctivae normal.     Pupils: Pupils are equal, round, and reactive to light.  Cardiovascular:     Rate and Rhythm: Normal rate and regular rhythm.     Pulses: Normal pulses.  Pulmonary:     Effort: Pulmonary effort is normal.      Breath sounds: Normal breath sounds. No wheezing, rhonchi or rales.  Abdominal:     Palpations: Abdomen is soft.     Tenderness: There is no abdominal tenderness. There is no guarding or rebound.  Musculoskeletal:     Cervical back: Neck supple.  Skin:    General: Skin is warm and dry.  Neurological:     Mental Status: He is alert.     GCS: GCS eye subscore is 4. GCS verbal subscore is 5. GCS motor subscore is 6.     Cranial Nerves: Cranial nerves are intact.     Motor: Motor function is intact.     Coordination: Finger-Nose-Finger Test normal.  Comments: Pt alert and oriented to self. He is able to follow commands without difficulty although he needs to be redirected several times during exam.      ED Results / Procedures / Treatments   Labs (all labs ordered are listed, but only abnormal results are displayed) Labs Reviewed  BASIC METABOLIC PANEL - Abnormal; Notable for the following components:      Result Value   Potassium 5.3 (*)    Chloride 96 (*)    BUN 66 (*)    Creatinine, Ser 14.27 (*)    GFR calc non Af Amer 4 (*)    GFR calc Af Amer 4 (*)    Anion gap 17 (*)    All other components within normal limits  CBC - Abnormal; Notable for the following components:   WBC 3.7 (*)    RBC 3.25 (*)    Hemoglobin 9.0 (*)    HCT 29.3 (*)    RDW 15.9 (*)    Platelets 80 (*)    nRBC 0.5 (*)    All other components within normal limits  TROPONIN I (HIGH SENSITIVITY) - Abnormal; Notable for the following components:   Troponin I (High Sensitivity) 22 (*)    All other components within normal limits  HEPATIC FUNCTION PANEL  CBG MONITORING, ED  TROPONIN I (HIGH SENSITIVITY)  TROPONIN I (HIGH SENSITIVITY)    EKG EKG Interpretation  Date/Time:  Thursday July 21 2019 15:33:38 EST Ventricular Rate:  75 PR Interval:    QRS Duration: 91 QT Interval:  416 QTC Calculation: 465 R Axis:   -54 Text Interpretation: Atrial fibrillation Ventricular premature complex Left  anterior fascicular block ST elev, probable normal early repol pattern When compared to prior, similar a flutter. No STEMI Confirmed by Antony Blackbird [phone]) on 07/21/2019 3:56:57 PM   Radiology DG Chest 2 View  Result Date: 07/21/2019 CLINICAL DATA:  Pt brought in by [facility] from the kidney center for syncopal episodes. Pt did not receive his tx today, per EMS pt has episode prior to being dialyzed. EXAM: CHEST - 2 VIEW COMPARISON:  Chest radiograph 03/28/2017 FINDINGS: Stable cardiomediastinal contours. The heart size is enlarged. Central venous congestion. No new focal infiltrate. No pneumothorax or large pleural effusion. No acute finding in the visualized skeleton. IMPRESSION: Cardiomegaly. Central venous congestion. No evidence of active disease. Electronically Signed   By: Audie Pinto M.D.   On: 07/21/2019 14:54   CT Head Wo Contrast  Result Date: 07/21/2019 CLINICAL DATA:  Syncope, hypertension EXAM: CT HEAD WITHOUT CONTRAST TECHNIQUE: Contiguous axial images were obtained from the base of the skull through the vertex without intravenous contrast. COMPARISON:  12/06/2005 FINDINGS: Brain: No evidence of acute infarction, hemorrhage, hydrocephalus, extra-axial collection or mass lesion/mass effect. Mild cerebral volume loss. Vascular: Mild atherosclerotic calcifications involving the large vessels of the skull base. No unexpected hyperdense vessel. Skull: Normal. Negative for fracture or focal lesion. Sinuses/Orbits: Paranasal sinuses are clear except for a mucous retention cyst versus sinonasal polyp within the left maxillary sinus. Orbital structures within normal limits. Other: None. IMPRESSION: No acute intracranial findings. Electronically Signed   By: Davina Poke D.O.   On: 07/21/2019 15:23    Procedures Procedures (including critical care time)  Medications Ordered in ED Medications  sodium chloride flush (NS) 0.9 % injection 3 mL (has no administration in time range)    cloNIDine (CATAPRES) tablet 0.1 mg (has no administration in time range)    ED Course  I have reviewed the  triage vital signs and the nursing notes.  Pertinent labs & imaging results that were available during my care of the patient were reviewed by me and considered in my medical decision making (see chart for details).  48 year old male presents the ED today with syncopal episode that happened while he was at his dialysis center awaiting dialysis.  He states he was getting up from the bathroom when he had a syncopal episode.  He is unsure if he hit his head.  Patient is poor historian unable to give much more information.  He is able to follow commands without difficulty once he redirect him.  Patient likes to go off on long tangents but is able to be easily redirected.  He has no focal neuro deficits on his exam today.  I do not suspect stroke.  Is no report of seizure-like activity.  Will check screening labs, chest x-ray, CT head.   Chest x-ray with findings of some vascular congestion distant with dialysis patient.  He does not appear volume overloaded at this time.  His creatinine is stable compared to baseline.  His potassium is mildly elevated at 5.3 although not in a range that needs to be treated currently.  Patient did miss dialysis today and will need to go again Saturday to keep on his regular schedule. He is advised if he begins feeling short of breath between time of discharge and dialysis that he will need to come to the ED. He is in agreement.  LFTs within normal limits.  CBC consistent with patient's baseline with history of pancytopenia.   CT head without acute findings today.  Troponin of 22.  Unfortunately a another troponin was not redrawn in a timely manner.  Currently awaiting on this.  Orthostatics within normal limits although patient's blood pressure has continued to creep up while in the ED today.  Will order clonidine for him as he normally takes this 3 times daily.   At  shift change case signed out to [employer], PA-C, who will dispo patient after repeat troponin.   Clinical Course as of Jul 20 1914  Thu Jul 21, 2019  1410 Hx of pancytopenia  CBC(!) [MV]    Clinical Course User Index [MV] Eustaquio Maize, PA-C   MDM Rules/Calculators/A&P                       Final Clinical Impression(s) / ED Diagnoses Final diagnoses:  Syncope and collapse  ESRD (end stage renal disease) [facility])    Rx / DC Orders ED Discharge Orders    None       Eustaquio Maize, PA-C 07/21/19 1915    Eustaquio Maize, PA-C 07/21/19 1937    Tegeler, Gwenyth Allegra, MD 07/21/19 2115

## 2019-07-21 NOTE — ED Notes (Signed)
Discharge instructions discussed with pt. Pt verbalized understanding with no questions at this time.  

## 2019-07-21 NOTE — ED Triage Notes (Signed)
Pt brought in by [facility] from the kidney center for syncopal episodes. Pt did not receive his tx today, per EMS pt has episode prior to being dialyzed. Pt poor historian. Pt having episodes of unresponsiveness in the middle of answering questions. Per EMS pt has been having these episodes at his baseline. EMS states they were also unable to get a clear story from pt.

## 2020-07-26 ENCOUNTER — Ambulatory Visit: Payer: Medicare Other | Admitting: Internal Medicine

## 2020-08-03 ENCOUNTER — Other Ambulatory Visit: Payer: Self-pay

## 2020-08-03 ENCOUNTER — Telehealth: Payer: Self-pay

## 2020-08-03 ENCOUNTER — Ambulatory Visit (INDEPENDENT_AMBULATORY_CARE_PROVIDER_SITE_OTHER): Payer: Medicare Other | Admitting: Physician Assistant

## 2020-08-03 VITALS — BP 97/68 | HR 83 | Temp 98.4°F | Resp 20 | Ht 74.0 in | Wt 179.1 lb

## 2020-08-03 DIAGNOSIS — E213 Hyperparathyroidism, unspecified: Secondary | ICD-10-CM | POA: Insufficient documentation

## 2020-08-03 DIAGNOSIS — Z992 Dependence on renal dialysis: Secondary | ICD-10-CM

## 2020-08-03 DIAGNOSIS — N186 End stage renal disease: Secondary | ICD-10-CM | POA: Diagnosis not present

## 2020-08-03 DIAGNOSIS — D509 Iron deficiency anemia, unspecified: Secondary | ICD-10-CM | POA: Insufficient documentation

## 2020-08-03 DIAGNOSIS — N390 Urinary tract infection, site not specified: Secondary | ICD-10-CM | POA: Insufficient documentation

## 2020-08-03 DIAGNOSIS — F32A Depression, unspecified: Secondary | ICD-10-CM | POA: Insufficient documentation

## 2020-08-03 DIAGNOSIS — F79 Unspecified intellectual disabilities: Secondary | ICD-10-CM | POA: Insufficient documentation

## 2020-08-03 DIAGNOSIS — N319 Neuromuscular dysfunction of bladder, unspecified: Secondary | ICD-10-CM | POA: Insufficient documentation

## 2020-08-03 NOTE — Telephone Encounter (Signed)
Spoke with patient's brother John Woodard regarding his need for surgery on left arm fistula according from office visit today with PA. Scheduled pt for 08/17/20. Brother stated he is unable to bring pt for surgery. Office will attempt to arrange [facility] transportation. Reviewed date/time, npo after MN and preadmission nurse will call to review medications prior to surgery. Brother/[name] verbalized understanding and agrees to best contact for information.

## 2020-08-03 NOTE — Progress Notes (Addendum)
Established Dialysis Access   History of Present Illness   John Woodard is a 50 y.o. (09-16-1970) male who presents for re-evaluation of permanent access.  He is s/p second stage basilic vein transposition by Dr. Trula Slade on 06/09/2018.  He was referred to our office due to scabbing overlying left arm AV fistula.  Patient comes to office by himself dropped off by [facility].  He is unable to hold a conversation and answers to questions are unrelated.  We contacted the patient's brother who informed us that the patient is extremely hard of hearing and only able to communicate in writing.  The patient dialyzes on a TTS schedule and ESRD is managed by Dr. Justin Mend.  Current Outpatient Medications  Medication Sig Dispense Refill  . acetaminophen (TYLENOL) 325 MG tablet Take 325 mg by mouth every 6 (six) hours as needed for moderate pain.     Marland Kitchen amLODipine (NORVASC) 10 MG tablet Take 10 mg by mouth at bedtime.    . cinacalcet (SENSIPAR) 30 MG tablet Take 30 mg by mouth every Monday, Wednesday, and Friday.     . cloNIDine (CATAPRES) 0.3 MG tablet Take 1 tablet (0.3 mg total) by mouth 3 (three) times daily. 90 tablet 0  . docusate sodium (COLACE) 100 MG capsule Take 100 mg by mouth 2 (two) times daily as needed for constipation.    Marland Kitchen doxazosin (CARDURA) 4 MG tablet Take 4 mg by mouth at bedtime.    Marland Kitchen esomeprazole (NEXIUM) 40 MG capsule Take 40 mg by mouth daily before breakfast.    . furosemide (LASIX) 40 MG tablet Take 40 mg by mouth daily.    Marland Kitchen HYDROcodone-acetaminophen (NORCO) 5-325 MG tablet Take 1 tablet by mouth every 4 (four) hours as needed for moderate pain. 15 tablet 0  . losartan (COZAAR) 100 MG tablet Take 100 mg by mouth daily.    . metoprolol succinate (TOPROL-XL) 50 MG 24 hr tablet Take 1 tablet (50 mg total) by mouth at bedtime. Take with or immediately following a meal. 30 tablet 0  . multivitamin (RENA-VIT) TABS tablet Take 1 tablet by mouth daily.    . sevelamer carbonate (RENVELA) 800 MG  tablet Take 800-1,600 mg by mouth See admin instructions. 1600mg  three times daily with meals and 800mg  with one snack.    Marland Kitchen amLODipine (NORVASC) 5 MG tablet Take 10 mg by mouth at bedtime.    . polyethylene glycol powder (GLYCOLAX/MIRALAX) 17 GM/SCOOP powder Take by mouth.     No current facility-administered medications for this visit.    REVIEW OF SYSTEMS (negative unless checked):   Cardiac:  []  Chest pain or chest pressure? []  Shortness of breath upon activity? []  Shortness of breath when lying flat? []  Irregular heart rhythm?  Vascular:  []  Pain in calf, thigh, or hip brought on by walking? []  Pain in feet at night that wakes you up from your sleep? []  Blood clot in your veins? []  Leg swelling?  Pulmonary:  []  Oxygen at home? []  Productive cough? []  Wheezing?  Neurologic:  []  Sudden weakness in arms or legs? []  Sudden numbness in arms or legs? []  Sudden onset of difficult speaking or slurred speech? []  Temporary loss of vision in one eye? []  Problems with dizziness?  Gastrointestinal:  []  Blood in stool? []  Vomited blood?  Genitourinary:  []  Burning when urinating? []  Blood in urine?  Psychiatric:  []  Major depression  Hematologic:  []  Bleeding problems? []  Problems with blood clotting?  Dermatologic:  []  Rashes  or ulcers?  Constitutional:  []  Fever or chills?  Ear/Nose/Throat:  []  Change in hearing? []  Nose bleeds? []  Sore throat?  Musculoskeletal:  []  Back pain? []  Joint pain? []  Muscle pain?   Physical Examination   Vitals:   08/03/20 0934  BP: 97/68  Pulse: 83  Resp: 20  Temp: 98.4 F (36.9 C)  TempSrc: Temporal  SpO2: 99%  Weight: 179 lb 1.6 oz (81.2 kg)  Height: 6\' 2"  (1.88 m)   Body mass index is 23 kg/m.  General:  WDWN in NAD; vital signs documented above Gait: Not observed HENT: WNL, normocephalic Pulmonary: normal non-labored breathing Cardiac: regular HR Abdomen: soft, NT, no masses Skin: without  rashes Vascular Exam/Pulses: symmetrical radial pulses Extremities: palpable thrill L arm AVF; hypopigmentation; moderate sized scab near arterial anastomosis Musculoskeletal: no muscle wasting or atrophy  Neurologic: A&O X 3;  No focal weakness or paresthesias are detected Psychiatric:  The pt has Normal affect.       Medical Decision Making   John Woodard is a 50 y.o. male who presents with ESRD requiring hemodialysis.    L arm basilic vein fistula with concerning scab near arterial anastomosis  Patient will require plication of L arm fistula on a non dialysis day in the near future  All questions were answered and patient agrees to proceed with surgery  For now, this area will be avoided during dialysis   Dagoberto Ligas PA-C [facility] Office: [facility] MD: Donzetta Matters

## 2020-08-07 ENCOUNTER — Telehealth: Payer: Self-pay | Admitting: Physician Assistant

## 2020-08-07 ENCOUNTER — Other Ambulatory Visit: Payer: Self-pay | Admitting: *Deleted

## 2020-08-07 DIAGNOSIS — N186 End stage renal disease: Secondary | ICD-10-CM

## 2020-08-07 NOTE — Telephone Encounter (Signed)
Mr. Saleem was scheduled for left arm basilic vein fistula plication later this month.  Dr. Johnney Ou, the patient's nephrologist, notified us that the fistula has since thrombosed and he is now dialyzing with a TDC.  Plan will now be to bring patient back to the office for vein mapping in preparation for new dialysis access.  Dagoberto Ligas, PA-C [facility] [phone] 08/07/2020  1:00 PM

## 2020-08-09 DIAGNOSIS — T829XXA Unspecified complication of cardiac and vascular prosthetic device, implant and graft, initial encounter: Secondary | ICD-10-CM | POA: Insufficient documentation

## 2020-08-17 ENCOUNTER — Other Ambulatory Visit: Payer: Self-pay

## 2020-08-17 ENCOUNTER — Ambulatory Visit (HOSPITAL_COMMUNITY)
Admission: RE | Admit: 2020-08-17 | Discharge: 2020-08-17 | Disposition: A | Discharge status: Home / Self Care | Payer: Medicare Other | Source: Ambulatory Visit | Attending: Vascular Surgery | Admitting: Vascular Surgery

## 2020-08-17 ENCOUNTER — Ambulatory Visit (INDEPENDENT_AMBULATORY_CARE_PROVIDER_SITE_OTHER): Payer: Medicare Other | Admitting: Physician Assistant

## 2020-08-17 ENCOUNTER — Encounter (HOSPITAL_COMMUNITY): Payer: Self-pay | Admitting: *Deleted

## 2020-08-17 ENCOUNTER — Telehealth: Payer: Self-pay

## 2020-08-17 ENCOUNTER — Ambulatory Visit: Admit: 2020-08-17 | Payer: Medicare Other | Admitting: Surgery

## 2020-08-17 ENCOUNTER — Ambulatory Visit (INDEPENDENT_AMBULATORY_CARE_PROVIDER_SITE_OTHER)
Admission: RE | Admit: 2020-08-17 | Discharge: 2020-08-17 | Disposition: A | Discharge status: Home / Self Care | Payer: Medicare Other | Source: Ambulatory Visit | Attending: Vascular Surgery | Admitting: Vascular Surgery

## 2020-08-17 VITALS — BP 127/85 | HR 88 | Temp 98.5°F | Resp 20 | Ht 74.0 in | Wt 183.8 lb

## 2020-08-17 DIAGNOSIS — Z992 Dependence on renal dialysis: Secondary | ICD-10-CM

## 2020-08-17 DIAGNOSIS — N186 End stage renal disease: Secondary | ICD-10-CM | POA: Diagnosis not present

## 2020-08-17 SURGERY — FISTULA SUPERFICIALIZATION
Anesthesia: Choice | Laterality: Left

## 2020-08-17 NOTE — Progress Notes (Signed)
Office Note     CC:  follow up Requesting Provider:  No ref. provider found  HPI: John Woodard is a 50 y.o. (31-Jul-1970) male who presents for re-evaluation of permanent access.  He is s/p second stage basilic vein transposition by Dr. Trula Slade on 06/09/2018.  He was referred to our office due to scabbing overlying left arm AV fistula and was last seen on 08/03/20. At that appointment he was scheduled for Plication of this area, which was originally planned for today in the OR. However, on I/18 Dr. Johnney Ou called to inform [facility] that the patients fistula had since thrombosed and they had placed a TDC so that he could dialyze.   He presents today for upper extremity vein mapping in preparation for new access placement.  Patient comes to office by himself dropped off by [facility].  He is extremely hard of hearing and only able to communicate in writing. I did my best of my ability to do this, but patient is also a poor historian and expressed extensive frustration and confusion about why he is here today and why his other accesses have failed.  The patient is currently dialyzing via right IJ TDC on a TTS schedule. His ESRD is managed by Dr. Justin Mend. Past Medical History:  Diagnosis Date  . Atrial fibrillation ([place])   . Dialysis patient ([place])   . Dyspnea   . ESRD on dialysis [facility])    [place]  . Hypertension   . Poor historian   . Renal disorder     Past Surgical History:  Procedure Laterality Date  . A/V SHUNTOGRAM Left 04/03/2017   Procedure: A/V Shuntogram;  Surgeon: Elam Dutch, MD;  Location: [place] [facility];  Service: Cardiovascular;  Laterality: Left;  . AVF Left   . BASCILIC VEIN TRANSPOSITION Left 04/09/2018   Procedure: FIRST STAGE BACILIC VEIN TRANSPOSITION LEFT ARM;  Surgeon: Serafina Mitchell, MD;  Location: [place];  Service: Vascular;  Laterality: Left;  . FISTULA SUPERFICIALIZATION Left 06/09/2018   Procedure: FISTULA SUPERFICIALIZATION LEFT UPPER ARM;  Surgeon:  Serafina Mitchell, MD;  Location: [place];  Service: Vascular;  Laterality: Left;  . FISTULOGRAM Left 07/16/2017   Procedure: FISTULOGRAM LEFT ARM ARTERIOVENOUS FISTULA;  Surgeon: Serafina Mitchell, MD;  Location: [facility] OR;  Service: Vascular;  Laterality: Left;  . PERIPHERAL VASCULAR BALLOON ANGIOPLASTY Left 04/03/2017   Procedure: PERIPHERAL VASCULAR BALLOON ANGIOPLASTY;  Surgeon: Elam Dutch, MD;  Location: [place] [facility];  Service: Cardiovascular;  Laterality: Left;  Arm AV fistula  . REVISON OF ARTERIOVENOUS FISTULA Left [id number]   Procedure: PLICATION  OF ARTERIOVENOUS FISTULA  LEFT ARM;  Surgeon: Serafina Mitchell, MD;  Location: [place];  Service: Vascular;  Laterality: Left;  Marland Kitchen VASCULAR SURGERY      Social History   Socioeconomic History  . Marital status: Single    Spouse name: Not on file  . Number of children: Not on file  . Years of education: Not on file  . Highest education level: Not on file  Occupational History  . Not on file  Tobacco Use  . Smoking status: Never Smoker  . Smokeless tobacco: Never Used  Substance and Sexual Activity  . Alcohol use: No  . Drug use: No  . Sexual activity: Not on file  Other Topics Concern  . Not on file  Social History Narrative  . Not on file   Social Determinants of Health   Financial Resource Strain: Not on file  Food Insecurity: Not on file  Transportation Needs: Not on file  Physical Activity: Not on file  Stress: Not on file  Social Connections: Not on file  Intimate Partner Violence: Not on file    Family History  Problem Relation Age of Onset  . Hypertension Mother   . Diabetes Mellitus II Sister     Current Outpatient Medications  Medication Sig Dispense Refill  . acetaminophen (TYLENOL) 325 MG tablet Take 325 mg by mouth every 6 (six) hours as needed for moderate pain.     Marland Kitchen amLODipine (NORVASC) 10 MG tablet Take 10 mg by mouth at bedtime.    Marland Kitchen amLODipine (NORVASC) 5 MG tablet Take 10 mg by mouth at  bedtime.    . cinacalcet (SENSIPAR) 30 MG tablet Take 30 mg by mouth every Monday, Wednesday, and Friday.     . cloNIDine (CATAPRES) 0.3 MG tablet Take 1 tablet (0.3 mg total) by mouth 3 (three) times daily. 90 tablet 0  . docusate sodium (COLACE) 100 MG capsule Take 100 mg by mouth 2 (two) times daily as needed for constipation.    Marland Kitchen doxazosin (CARDURA) 4 MG tablet Take 4 mg by mouth at bedtime.    Marland Kitchen esomeprazole (NEXIUM) 40 MG capsule Take 40 mg by mouth daily before breakfast.    . furosemide (LASIX) 40 MG tablet Take 40 mg by mouth daily.    Marland Kitchen HYDROcodone-acetaminophen (NORCO) 5-325 MG tablet Take 1 tablet by mouth every 4 (four) hours as needed for moderate pain. 15 tablet 0  . losartan (COZAAR) 100 MG tablet Take 100 mg by mouth daily.    . metoprolol succinate (TOPROL-XL) 50 MG 24 hr tablet Take 1 tablet (50 mg total) by mouth at bedtime. Take with or immediately following a meal. 30 tablet 0  . multivitamin (RENA-VIT) TABS tablet Take 1 tablet by mouth daily.    . polyethylene glycol powder (GLYCOLAX/MIRALAX) 17 GM/SCOOP powder Take by mouth.    . sevelamer carbonate (RENVELA) 800 MG tablet Take 800-1,600 mg by mouth See admin instructions. '1600mg'$  three times daily with meals and '800mg'$  with one snack.     No current facility-administered medications for this visit.    Allergies  Allergen Reactions  . Naproxen Anaphylaxis and Other (See Comments)    CARDIAC ARREST "heart stopped one time"  . Lactose Intolerance (Gi) Diarrhea and Other (See Comments)    BLOODY STOOLS  . Penicillins     UNSPECIFIED REACTION      REVIEW OF SYSTEMS:  Negative unless stated in HPI Very difficult to obtain due to patients status   PHYSICAL EXAMINATION:  Vitals:   08/17/20 0849  BP: 127/85  Pulse: 88  Resp: 20  Temp: 98.5 F (36.9 C)  TempSrc: Temporal  SpO2: 96%  Weight: 183 lb 12.8 oz (83.4 kg)  Height: '6\' 2"'$  (1.88 m)    General:  WDWN in NAD; vital signs documented above Gait:  Normal HENT: WNL, normocephalic Pulmonary: normal non-labored breathing Cardiac: regular HR, without  Murmurs Vascular Exam/Pulses:2+ radial pulses bilaterally, hands warm. 5/5 grip strength bilaterally Left BC fistula thrombosed. No palpable thrill. No audible bruit. Old occluded left radiocephalic fistula  Musculoskeletal: no muscle wasting or atrophy  Neurologic: A&O X 3;  No focal weakness or paresthesias are detected Psychiatric:  The pt has Normal affect.   Non-Invasive Vascular Imaging:   Independently reviewed right upper extremity arterial duplex which shows patent ulnar, radial and brachial arteries with triphasic flow  +-----------------+-------------+----------+--------+  Right Cephalic  Diameter (cm)Depth (cm)Findings  +-----------------+-------------+----------+--------+  Shoulder       0.45              +-----------------+-------------+----------+--------+  Prox upper arm    0.45              +-----------------+-------------+----------+--------+  Mid upper arm    0.49              +-----------------+-------------+----------+--------+  Dist upper arm    0.40              +-----------------+-------------+----------+--------+  Antecubital fossa  0.54              +-----------------+-------------+----------+--------+  Prox forearm     0.32              +-----------------+-------------+----------+--------+  Mid forearm     0.34              +-----------------+-------------+----------+--------+  Dist forearm     0.40              +-----------------+-------------+----------+--------+   +-----------------+-------------+----------+--------------------+  Right Basilic  Diameter (cm)Depth (cm)   Findings     +-----------------+-------------+----------+--------------------+  Prox upper arm    0.79                     +-----------------+-------------+----------+--------------------+  Mid upper arm    0.58                    +-----------------+-------------+----------+--------------------+  Dist upper arm    0.63                    +-----------------+-------------+----------+--------------------+  Antecubital fossa  0.46        large branch 0.56 cm  +-----------------+-------------+----------+--------------------+   Summary: Right: Patent cephalic and basilic veins.    ASSESSMENT/PLAN:: 50 y.o. male here for follow up for evaluation of new permanent access.  Left brachiocephalic AV fistula now occluded. Duplex today shows patent right upper extremity arteries and adequate vein conduits for right upper extremity fistula. He would best be suited for a single stage BC fistula. He is currently dialyzing on TTS via right IJ TDC. -Will have him scheduled for right upper extremity first stage Brachiocephalic AV fistula by Dr. Marcell Anger, PA-C [facility] [phone]  Clinic MD:  Dr. Donzetta Matters

## 2020-08-17 NOTE — Progress Notes (Signed)
Patient presented to the hospital at [facility] for surgery. Patient was unaware surgery was cancelled and that he has an appointment for [place] and vein mapping at 0800/0830. Transportation services called and patient transported via car to [employer] will need transportation home as brother will not be available to pick him up. Chat message sent to [facility] scheduler.

## 2020-08-17 NOTE — Telephone Encounter (Signed)
Opened in error

## 2020-08-20 ENCOUNTER — Ambulatory Visit: Payer: Medicare Other | Admitting: Internal Medicine

## 2020-08-24 ENCOUNTER — Other Ambulatory Visit: Payer: Self-pay

## 2020-08-24 ENCOUNTER — Encounter: Payer: Self-pay | Admitting: Internal Medicine

## 2020-08-24 ENCOUNTER — Ambulatory Visit (INDEPENDENT_AMBULATORY_CARE_PROVIDER_SITE_OTHER): Payer: Medicare Other | Admitting: Internal Medicine

## 2020-08-24 VITALS — BP 126/84 | HR 64 | Temp 98.4°F | Resp 16 | Ht 74.0 in | Wt 196.0 lb

## 2020-08-24 DIAGNOSIS — N186 End stage renal disease: Secondary | ICD-10-CM

## 2020-08-24 DIAGNOSIS — D539 Nutritional anemia, unspecified: Secondary | ICD-10-CM

## 2020-08-24 DIAGNOSIS — Z794 Long term (current) use of insulin: Secondary | ICD-10-CM

## 2020-08-24 DIAGNOSIS — E0822 Diabetes mellitus due to underlying condition with diabetic chronic kidney disease: Secondary | ICD-10-CM

## 2020-08-24 DIAGNOSIS — Z992 Dependence on renal dialysis: Secondary | ICD-10-CM | POA: Diagnosis not present

## 2020-08-24 LAB — VITAMIN B12: Vitamin B-12: 660 pg/mL (ref 211–911)

## 2020-08-24 LAB — CBC WITH DIFFERENTIAL/PLATELET
Basophils Absolute: 0 10*3/uL (ref 0.0–0.1)
Basophils Relative: 0.7 % (ref 0.0–3.0)
Eosinophils Absolute: 0.1 10*3/uL (ref 0.0–0.7)
Eosinophils Relative: 2.7 % (ref 0.0–5.0)
HCT: 26.1 % — ABNORMAL LOW (ref 39.0–52.0)
Hemoglobin: 8.5 g/dL — ABNORMAL LOW (ref 13.0–17.0)
Lymphocytes Relative: 26.7 % (ref 12.0–46.0)
Lymphs Abs: 1 10*3/uL (ref 0.7–4.0)
MCHC: 32.4 g/dL (ref 30.0–36.0)
MCV: 89.4 fl (ref 78.0–100.0)
Monocytes Absolute: 0.3 10*3/uL (ref 0.1–1.0)
Monocytes Relative: 8.1 % (ref 3.0–12.0)
Neutro Abs: 2.2 10*3/uL (ref 1.4–7.7)
Neutrophils Relative %: 61.8 % (ref 43.0–77.0)
Platelets: 200 10*3/uL (ref 150.0–400.0)
RBC: 2.92 Mil/uL — ABNORMAL LOW (ref 4.22–5.81)
RDW: 16.7 % — ABNORMAL HIGH (ref 11.5–15.5)
WBC: 3.6 10*3/uL — ABNORMAL LOW (ref 4.0–10.5)

## 2020-08-24 LAB — POCT GLYCOSYLATED HEMOGLOBIN (HGB A1C): Hemoglobin A1C: 4.3 % (ref 4.0–5.6)

## 2020-08-24 LAB — POCT GLUCOSE (DEVICE FOR HOME USE): Glucose Fasting, POC: 118 mg/dL — AB (ref 70–99)

## 2020-08-24 LAB — FOLATE: Folate: 21.9 ng/mL (ref >5.9)

## 2020-08-24 LAB — IRON: Iron: 59 ug/dL (ref 42–165)

## 2020-08-24 LAB — TSH: TSH: 2.8 u[IU]/mL (ref 0.35–4.50)

## 2020-08-24 LAB — FERRITIN: Ferritin: 1166.1 ng/mL — ABNORMAL HIGH (ref 22.0–322.0)

## 2020-08-24 NOTE — Patient Instructions (Signed)
[employer] medicine (25th ed., pp. [phone]). [place], [place]: Elsevier.">  Anemia  Anemia is a condition in which there is not enough red blood cells or hemoglobin in the blood. Hemoglobin is a substance in red blood cells that carries oxygen. When you do not have enough red blood cells or hemoglobin (are anemic), your body cannot get enough oxygen and your organs may not work properly. As a result, you may feel very tired or have other problems. What are the causes? Common causes of anemia include:  Excessive bleeding. Anemia can be caused by excessive bleeding inside or outside the body, including bleeding from the intestines or from heavy menstrual periods in females.  Poor nutrition.  Long-lasting (chronic) kidney, thyroid, and liver disease.  Bone marrow disorders, spleen problems, and blood disorders.  Cancer and treatments for cancer.  HIV (human immunodeficiency virus) and AIDS (acquired immunodeficiency syndrome).  Infections, medicines, and autoimmune disorders that destroy red blood cells. What are the signs or symptoms? Symptoms of this condition include:  Minor weakness.  Dizziness.  Headache, or difficulties concentrating and sleeping.  Heartbeats that feel irregular or faster than normal (palpitations).  Shortness of breath, especially with exercise.  Pale skin, lips, and nails, or cold hands and feet.  Indigestion and nausea. Symptoms may occur suddenly or develop slowly. If your anemia is mild, you may not have symptoms. How is this diagnosed? This condition is diagnosed based on blood tests, your medical history, and a physical exam. In some cases, a test may be needed in which cells are removed from the soft tissue inside of a bone and looked at under a microscope (bone marrow biopsy). Your health care provider may also check your stool (feces) for blood and may do additional testing to look for the cause of your bleeding. Other tests may  include:  Imaging tests, such as a CT scan or MRI.  A procedure to see inside your esophagus and stomach (endoscopy).  A procedure to see inside your colon and rectum (colonoscopy). How is this treated? Treatment for this condition depends on the cause. If you continue to lose a lot of blood, you may need to be treated at a hospital. Treatment may include:  Taking supplements of iron, vitamin Q68, or folic acid.  Taking a hormone medicine (erythropoietin) that can help to stimulate red blood cell growth.  Having a blood transfusion. This may be needed if you lose a lot of blood.  Making changes to your diet.  Having surgery to remove your spleen. Follow these instructions at home:  Take over-the-counter and prescription medicines only as told by your health care provider.  Take supplements only as told by your health care provider.  Follow any diet instructions that you were given by your health care provider.  Keep all follow-up visits as told by your health care provider. This is important. Contact a health care provider if:  You develop new bleeding anywhere in the body. Get help right away if:  You are very weak.  You are short of breath.  You have pain in your abdomen or chest.  You are dizzy or feel faint.  You have trouble concentrating.  You have bloody stools, black stools, or tarry stools.  You vomit repeatedly or you vomit up blood. These symptoms may represent a serious problem that is an emergency. Do not wait to see if the symptoms will go away. Get medical help right away. Call your local emergency services (911 in the [place] Do not  drive yourself to the hospital. Summary  Anemia is a condition in which you do not have enough red blood cells or enough of a substance in your red blood cells that carries oxygen (hemoglobin).  Symptoms may occur suddenly or develop slowly.  If your anemia is mild, you may not have symptoms.  This condition is  diagnosed with blood tests, a medical history, and a physical exam. Other tests may be needed.  Treatment for this condition depends on the cause of the anemia. This information is not intended to replace advice given to you by your health care provider. Make sure you discuss any questions you have with your health care provider. Document Revised: 06/14/2019 Document Reviewed: 06/14/2019 Elsevier Patient Education  2021 Elsevier Inc.  

## 2020-08-24 NOTE — Progress Notes (Signed)
Subjective:  Patient ID: John Woodard, male    DOB: 01/07/1971  Age: 50 y.o. MRN: [id number]  CC: Anemia  This visit occurred during the SARS-CoV-2 public health emergency.  Safety protocols were in place, including screening questions prior to the visit, additional usage of staff PPE, and extensive cleaning of exam room while observing appropriate contact time as indicated for disinfecting solutions.    HPI John Woodard presents for establishing.  He is not able to tell me much about himself other than that he sees a urologist to manage his suprapubic catheter and is undergoing hemodialysis 3 days a week in [place].  He has a history of diabetes and end-stage renal disease.  History John Woodard has a past medical history of Atrial fibrillation ([place]), Dialysis patient ([place]), Dyspnea, ESRD on dialysis ([place]), Hypertension, Poor historian, and Renal disorder.   He has a past surgical history that includes AVF (Left); Vascular surgery; A/V SHUNTOGRAM (Left, 04/03/2017); PERIPHERAL VASCULAR BALLOON ANGIOPLASTY (Left, 04/03/2017); [place]ison of arteriovenous fistula (Left, 07/16/2017); Fistulogram (Left, 07/16/2017); Bascilic vein transposition (Left, 04/09/2018); and Fistula superficialization (Left, 06/09/2018).   His family history includes Diabetes Mellitus II in his sister; Hypertension in his mother.He reports that he has never smoked. He has never used smokeless tobacco. He reports that he does not drink alcohol and does not use drugs.  Outpatient Medications Prior to Visit  Medication Sig Dispense Refill  . acetaminophen (TYLENOL) 325 MG tablet Take 325 mg by mouth every 6 (six) hours as needed for moderate pain.     Marland Kitchen amLODipine (NORVASC) 10 MG tablet Take 10 mg by mouth at bedtime.    Marland Kitchen amLODipine (NORVASC) 5 MG tablet Take 10 mg by mouth at bedtime.    . cinacalcet (SENSIPAR) 30 MG tablet Take 30 mg by mouth every Monday, Wednesday, and Friday.     . cloNIDine (CATAPRES) 0.3 MG tablet Take  1 tablet (0.3 mg total) by mouth 3 (three) times daily. 90 tablet 0  . docusate sodium (COLACE) 100 MG capsule Take 100 mg by mouth 2 (two) times daily as needed for constipation.    Marland Kitchen doxazosin (CARDURA) 4 MG tablet Take 4 mg by mouth at bedtime.    Marland Kitchen esomeprazole (NEXIUM) 40 MG capsule Take 40 mg by mouth daily before breakfast.    . furosemide (LASIX) 40 MG tablet Take 40 mg by mouth daily.    Marland Kitchen HYDROcodone-acetaminophen (NORCO) 5-325 MG tablet Take 1 tablet by mouth every 4 (four) hours as needed for moderate pain. 15 tablet 0  . losartan (COZAAR) 100 MG tablet Take 100 mg by mouth daily.    . metoprolol succinate (TOPROL-XL) 50 MG 24 hr tablet Take 1 tablet (50 mg total) by mouth at bedtime. Take with or immediately following a meal. 30 tablet 0  . multivitamin (RENA-VIT) TABS tablet Take 1 tablet by mouth daily.    . polyethylene glycol powder (GLYCOLAX/MIRALAX) 17 GM/SCOOP powder Take by mouth.    . sevelamer carbonate (RENVELA) 800 MG tablet Take 800-1,600 mg by mouth See admin instructions. '1600mg'$  three times daily with meals and '800mg'$  with one snack.     No facility-administered medications prior to visit.    ROS Review of Systems  Reason unable to perform ROS: unable to report symptoms.    Objective:  BP 126/84   Pulse 64   Temp 98.4 F (36.9 C) (Oral)   Resp 16   Ht '6\' 2"'$  (1.88 m)   Wt 196 lb (88.9 kg)  SpO2 97%   BMI 25.16 kg/m   Physical Exam Vitals reviewed.  HENT:     Nose: Nose normal.     Mouth/Throat:     Mouth: Mucous membranes are moist. Mucous membranes are pale.  Eyes:     General: No scleral icterus.    Conjunctiva/sclera: Conjunctivae normal.  Cardiovascular:     Rate and Rhythm: Normal rate and regular rhythm.     Heart sounds: No murmur heard. No gallop.   Pulmonary:     Effort: Pulmonary effort is normal.     Breath sounds: No stridor. No wheezing, rhonchi or rales.  Chest:    Abdominal:     General: Abdomen is flat.     Palpations:  There is no mass.     Tenderness: There is no abdominal tenderness. There is no guarding.  Genitourinary:   Musculoskeletal:        General: Normal range of motion.     Cervical back: Neck supple.     Right lower leg: Edema (trace pitting edema) present.     Left lower leg: Edema (trace pitting edema) present.  Lymphadenopathy:     Cervical: No cervical adenopathy.  Skin:    General: Skin is warm and dry.     Coloration: Skin is pale.     Findings: No rash.  Neurological:     General: No focal deficit present.     Mental Status: He is alert.  Psychiatric:        Mood and Affect: Mood normal.        Behavior: Behavior normal.     Lab Results  Component Value Date   WBC 3.6 (L) 08/24/2020   HGB 8.5 Repeated and verified X2. (L) 08/24/2020   HCT 26.1 (L) 08/24/2020   PLT 200.0 08/24/2020   GLUCOSE 93 07/21/2019   ALT 22 07/21/2019   AST 28 07/21/2019   NA 141 07/21/2019   K 5.3 (H) 07/21/2019   CL 96 (L) 07/21/2019   CREATININE 14.27 (H) 07/21/2019   BUN 66 (H) 07/21/2019   CO2 28 07/21/2019   TSH 2.80 08/24/2020   INR 0.98 04/03/2017   HGBA1C 4.3 08/24/2020    Assessment & Plan:   John Woodard was seen today for anemia.  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 -     POCT Glucose (Device for Home Use) -     POCT glycosylated hemoglobin (Hb A1C)  Deficiency anemia- I will screen him for vitamin deficiencies.  This is likely the anemia of chronic disease related to end-stage renal disease. -     CBC with Differential/Platelet; Future -     Vitamin B12; Future -     Iron; Future -     TSH; Future -     Vitamin B1; Future -     Folate; Future -     Ferritin; Future -     CBC with Differential/Platelet -     Vitamin B12 -     Iron -     TSH -     Vitamin B1 -     Folate -      Ferritin   I am having John Woodard maintain his esomeprazole, multivitamin, sevelamer carbonate, cinacalcet, amLODipine, furosemide, doxazosin, acetaminophen, losartan, cloNIDine, metoprolol succinate, docusate sodium, HYDROcodone-acetaminophen, amLODipine, and polyethylene glycol powder.  No orders of the defined types were placed in this encounter.    Follow-up: Return in about 3 months (around 11/21/2020).  Scarlette Calico, MD

## 2020-08-27 ENCOUNTER — Encounter (HOSPITAL_COMMUNITY): Payer: Self-pay | Admitting: Surgery

## 2020-08-27 NOTE — Progress Notes (Addendum)
PCP - Dr Scarlette Calico Dialysis - Dr Pearson Grippe, [facility] Cardiologist - n/a  Chest x-ray - n/a EKG - DOS 08/30/20 Stress Test - n/a ECHO - 04/01/17 Cardiac Cath - n/a  Anesthesia review: Yes  STOP now taking any Aspirin (unless otherwise instructed by your surgeon), Aleve, Naproxen, Ibuprofen, Motrin, Advil, Goody's, BC's, all herbal medications, fish oil, and all vitamins.   Coronavirus Screening Covid test on Wed 08/29/20. Do you have any of the following symptoms:  Cough yes/no: No Fever (>100.53F)  yes/no: No Runny nose yes/no: No Sore throat yes/no: No Difficulty breathing/shortness of breath  yes/no: No  Have you traveled in the last 14 days and where? yes/no: No  Patient verbalized understanding of instructions that were given via phone.  Patient to call Dr Stephens [place] office to get instructions for dialysis prior to surgery and transportation arrangements if needed.

## 2020-08-29 ENCOUNTER — Other Ambulatory Visit: Payer: Self-pay

## 2020-08-29 ENCOUNTER — Encounter (HOSPITAL_COMMUNITY): Payer: Self-pay | Admitting: Vascular Surgery

## 2020-08-29 ENCOUNTER — Encounter (HOSPITAL_COMMUNITY): Payer: Self-pay | Admitting: Surgery

## 2020-08-29 ENCOUNTER — Telehealth: Payer: Self-pay

## 2020-08-29 ENCOUNTER — Other Ambulatory Visit (HOSPITAL_COMMUNITY)
Admission: RE | Admit: 2020-08-29 | Discharge: 2020-08-29 | Disposition: A | Discharge status: Home / Self Care | Payer: Medicare Other | Source: Ambulatory Visit | Attending: Surgery | Admitting: Surgery

## 2020-08-29 DIAGNOSIS — Z01812 Encounter for preprocedural laboratory examination: Secondary | ICD-10-CM | POA: Insufficient documentation

## 2020-08-29 DIAGNOSIS — Z20822 Contact with and (suspected) exposure to covid-19: Secondary | ICD-10-CM | POA: Insufficient documentation

## 2020-08-29 LAB — SARS CORONAVIRUS 2 (TAT 6-24 HRS): SARS Coronavirus 2: NEGATIVE

## 2020-08-29 NOTE — Progress Notes (Signed)
Patient is scheduled for surgery tomorrow 08/30/20 with Dr Trula Slade from [phone].  Patient's brother [name] [phone]) will be picking patient up when he gets off work at 4 pm. [name] should arrive at [facility] between 4:30 pm - 5:00 pm.  I instructed [name] to call PACU [phone]) when he arrives at Entrance A, [address] on the left hand side.  Informed [name] that someone would bring [place] to the car.  [name] verbalized understanding and did not have any questions. PACU number was given to [facility].  Delsa Sale from MD's office, Lovena Le, RN from PACU and Rolla Flatten, AD PRE-OP was notified of this arrangement.

## 2020-08-29 NOTE — Telephone Encounter (Signed)
Patient has dialysis TTS and has a RUA 1st stage BC fistula surgery scheduled on tomorrow with Dr. Trula Slade. Spoke with [facility] who agreed to change pts dialysis day to Friday of this week. Left message with pts brother [name] of the change. Verbalized understanding.   Confirmed transportation to covid test site and surgery is arranged with [facility] transportation.

## 2020-08-29 NOTE — Progress Notes (Signed)
Anesthesia Chart Review: John Woodard   Case: [id number] Date/Time: 08/30/20 0715   Procedure: RIGHT UPPER EXTREMITY FIRST STAGE BRACHIOCEPHALIC ARTERIOVENOUS (AV) FISTULA CREATION (Right )   Anesthesia type: Choice   Pre-op diagnosis: ESRD   Location: [facility] OR ROOM 11 / [facility]   Surgeons: Serafina Mitchell, MD      DISCUSSION: Patient is a 50 year old male scheduled for the above procedure. Records indicate he current has a right IJ tunneled catheter 08/08/20. [facility] is changing hemodialysis on 08/31/20 since surgery is scheduled for 08/30/20 (normally TTS schedule).   History includes never smoker, ESRD (TTS), HTN, afib, GERD, hard of hearing, dyspnea, poor historian, anemia. Records also indicate that he has a history of neurogenic bladder with suprapubic catheter, cognitive impairment with psych overlay. Multiple HD access procedures including plication of LUE AVF [id number], first stage left basilic vein transposition 04/09/18 and second stage 06/09/18, at [facility].    Last primary care evaluation by John Lima, MD was on 08/24/20 to establish care and follow-up chronic medical conditions. Patient does not see cardiology, but does follow with urology and nephrology. In 03/2017, he was not necessarily thought to be a good anticoagulation candidate due to compliance issues.  Echo showed normal LVEF without wall motion abnormalities, grade II diastolic dysfunction, biatrial enlargement, moderate TR. Medication regimen does include a b-blocker.     08/29/20 preoperative COVID-19 test is in process. [place] COVID-19 booster 04/25/20. He is for labs and anesthesia team evaluation on the day of surgery. Last EKG seen is > 74 year old (07/21/19).    VS: Ht '6\' 2"'$  (1.88 m)   Wt 88.9 kg   BMI 25.16 kg/m   BP Readings from Last 3 Encounters:  08/24/20 126/84  08/17/20 127/85  08/03/20 97/68   Pulse Readings from Last 3 Encounters:  08/24/20 64  08/17/20 88  08/03/20 83     PROVIDERS: John Lima, MD is PCP    LABS: For day of surgery. Labs as of 08/24/20 include: Lab Results  Component Value Date   WBC 3.6 (L) 08/24/2020   HGB 8.5 Repeated and verified X2. (L) 08/24/2020   HCT 26.1 (L) 08/24/2020   PLT 200.0 08/24/2020   GLUCOSE 93 07/21/2019   ALT 22 07/21/2019   AST 28 07/21/2019   NA 141 07/21/2019   K 5.3 (H) 07/21/2019   CL 96 (L) 07/21/2019   CREATININE 14.27 (H) 07/21/2019   BUN 66 (H) 07/21/2019   CO2 28 07/21/2019   TSH 2.80 08/24/2020   HGBA1C 4.3 08/24/2020    EKG: He has been in afib since at least 2018--as well as persistent peaked T waves. Currently, last EKG noted is from 07/21/19 and showed afib at 75 bpm, occasional PVC, LAFB, ST elevation, probable normal early repolarization pattern.     CV: Echo 04/01/17: Study Conclusions: - Left ventricle: The cavity size was normal. There was severeconcentric hypertrophy. Systolic function was vigorous. Theestimated ejection fraction was in the range of 65% to 70%. Wallmotion was normal; there were no regional wall motionabnormalities. Features are consistent with a pseudonormal leftventricular filling pattern, with concomitant abnormal relaxation and increased filling pressure (grade 2 diastolic dysfunction). - Aortic valve: There was mild regurgitation. - Mitral valve: There was mild regurgitation. - Left atrium: The atrium was severely dilated. - Right atrium: The atrium was severely dilated. - Tricuspid valve: There was moderate regurgitation.    Past Medical History:  Diagnosis Date  . Anemia   .  Atrial fibrillation ([place])   . Constipation   . Dialysis patient ([place])   . Dyspnea   . Dysrhythmia    a-fib  . ESRD on dialysis [facility])    Hemo [place] T, Th , Sat  . GERD (gastroesophageal reflux disease)   . Hard of hearing   . Hypertension   . Poor historian   . Renal disorder     Past Surgical History:  Procedure Laterality Date  . A/V SHUNTOGRAM Left  04/03/2017   Procedure: A/V Shuntogram;  Surgeon: Elam Dutch, MD;  Location: [place] CV LAB;  Service: Cardiovascular;  Laterality: Left;  . AVF Left   . BASCILIC VEIN TRANSPOSITION Left 04/09/2018   Procedure: FIRST STAGE BACILIC VEIN TRANSPOSITION LEFT ARM;  Surgeon: Serafina Mitchell, MD;  Location: [place];  Service: Vascular;  Laterality: Left;  . COLONOSCOPY  2018  . FISTULA SUPERFICIALIZATION Left 06/09/2018   Procedure: FISTULA SUPERFICIALIZATION LEFT UPPER ARM;  Surgeon: Serafina Mitchell, MD;  Location: [place];  Service: Vascular;  Laterality: Left;  . FISTULOGRAM Left 07/16/2017   Procedure: FISTULOGRAM LEFT ARM ARTERIOVENOUS FISTULA;  Surgeon: Serafina Mitchell, MD;  Location: [facility] OR;  Service: Vascular;  Laterality: Left;  . FLEXIBLE SIGMOIDOSCOPY  10/2016  . PERIPHERAL VASCULAR BALLOON ANGIOPLASTY Left 04/03/2017   Procedure: PERIPHERAL VASCULAR BALLOON ANGIOPLASTY;  Surgeon: Elam Dutch, MD;  Location: [place] CV LAB;  Service: Cardiovascular;  Laterality: Left;  Arm AV fistula  . REVISON OF ARTERIOVENOUS FISTULA Left [id number]   Procedure: PLICATION  OF ARTERIOVENOUS FISTULA  LEFT ARM;  Surgeon: Serafina Mitchell, MD;  Location: [place];  Service: Vascular;  Laterality: Left;  Marland Kitchen VASCULAR SURGERY      MEDICATIONS: No current facility-administered medications for this encounter.   Marland Kitchen acetaminophen (TYLENOL) 325 MG tablet  . amLODipine (NORVASC) 5 MG tablet  . cinacalcet (SENSIPAR) 30 MG tablet  . cloNIDine (CATAPRES) 0.3 MG tablet  . docusate sodium (COLACE) 100 MG capsule  . doxazosin (CARDURA) 4 MG tablet  . esomeprazole (NEXIUM) 40 MG capsule  . furosemide (LASIX) 40 MG tablet  . losartan (COZAAR) 100 MG tablet  . metoprolol succinate (TOPROL-XL) 50 MG 24 hr tablet  . multivitamin (RENA-VIT) TABS tablet  . sevelamer carbonate (RENVELA) 800 MG tablet  . HYDROcodone-acetaminophen (NORCO) 5-325 MG tablet  . polyethylene glycol powder (GLYCOLAX/MIRALAX) 17  GM/SCOOP powder    John Gianotti, PA-C Surgical Short Stay/Anesthesiology [facility] Phone [phone] [facility] Phone [phone] 08/29/2020 1:28 PM

## 2020-08-29 NOTE — Progress Notes (Addendum)
Called Becky at [facility]. Went to  [facility].  LMOM informing her that patient will be calling for dialysis instructions prior to surgery and possible transportation arrangements for DOS.  Called patient's Brother [name] at [phone] to see if he could provide transportation for tomorrow's surgery.  [name] is working Architectural technologist and is unable to provide transportation for patient.

## 2020-08-29 NOTE — Anesthesia Preprocedure Evaluation (Deleted)
Anesthesia Evaluation  Patient identified by MRN, date of birth, ID band Patient awake    Reviewed: Allergy & Precautions, H&P , NPO status , Patient's Chart, lab work & pertinent test results  Airway Mallampati: II   Neck ROM: full    Dental   Pulmonary    breath sounds clear to auscultation       Cardiovascular hypertension, + dysrhythmias Atrial Fibrillation  Rhythm:regular     Neuro/Psych PSYCHIATRIC DISORDERS Depression  Neuromuscular disease    GI/Hepatic GERD  ,  Endo/Other  diabetes  Renal/GU ESRF and DialysisRenal disease     Musculoskeletal   Abdominal   Peds  Hematology   Anesthesia Other Findings   Reproductive/Obstetrics                            Anesthesia Physical Anesthesia Plan  ASA: III  Anesthesia Plan: General   Post-op Pain Management:    Induction: Intravenous  PONV Risk Score and Plan: 2 and Ondansetron, Dexamethasone and Treatment may vary due to age or medical condition  Airway Management Planned: LMA  Additional Equipment:   Intra-op Plan:   Post-operative Plan: Extubation in OR  Informed Consent: I have reviewed the patients History and Physical, chart, labs and discussed the procedure including the risks, benefits and alternatives for the proposed anesthesia with the patient or authorized representative who has indicated his/her understanding and acceptance.     Dental advisory given  Plan Discussed with: CRNA, Anesthesiologist and Surgeon  Anesthesia Plan Comments: (PAT note written 08/29/2020 by Myra Gianotti, PA-C. )       Anesthesia Quick Evaluation

## 2020-08-30 ENCOUNTER — Other Ambulatory Visit: Payer: Self-pay

## 2020-08-30 ENCOUNTER — Emergency Department (HOSPITAL_COMMUNITY): Payer: Medicare Other

## 2020-08-30 ENCOUNTER — Emergency Department (HOSPITAL_COMMUNITY)
Admission: EM | Admit: 2020-08-30 | Discharge: 2020-08-30 | Disposition: A | Discharge status: Home / Self Care | Payer: Medicare Other | Attending: Emergency Medicine | Admitting: Emergency Medicine

## 2020-08-30 ENCOUNTER — Encounter (HOSPITAL_COMMUNITY)
Admission: EM | Disposition: A | Discharge status: Home / Self Care | Payer: Self-pay | Source: Home / Self Care | Attending: Emergency Medicine

## 2020-08-30 ENCOUNTER — Ambulatory Visit (HOSPITAL_COMMUNITY): Admission: RE | Admit: 2020-08-30 | Payer: Medicare Other | Source: Home / Self Care | Admitting: Surgery

## 2020-08-30 ENCOUNTER — Encounter (HOSPITAL_COMMUNITY): Payer: Self-pay | Admitting: Emergency Medicine

## 2020-08-30 DIAGNOSIS — E1122 Type 2 diabetes mellitus with diabetic chronic kidney disease: Secondary | ICD-10-CM | POA: Insufficient documentation

## 2020-08-30 DIAGNOSIS — Z992 Dependence on renal dialysis: Secondary | ICD-10-CM | POA: Diagnosis not present

## 2020-08-30 DIAGNOSIS — N186 End stage renal disease: Secondary | ICD-10-CM | POA: Diagnosis not present

## 2020-08-30 DIAGNOSIS — Z79899 Other long term (current) drug therapy: Secondary | ICD-10-CM | POA: Diagnosis not present

## 2020-08-30 DIAGNOSIS — W01198A Fall on same level from slipping, tripping and stumbling with subsequent striking against other object, initial encounter: Secondary | ICD-10-CM | POA: Insufficient documentation

## 2020-08-30 DIAGNOSIS — S0990XA Unspecified injury of head, initial encounter: Secondary | ICD-10-CM | POA: Insufficient documentation

## 2020-08-30 DIAGNOSIS — R609 Edema, unspecified: Secondary | ICD-10-CM | POA: Diagnosis not present

## 2020-08-30 DIAGNOSIS — W19XXXA Unspecified fall, initial encounter: Secondary | ICD-10-CM

## 2020-08-30 DIAGNOSIS — I120 Hypertensive chronic kidney disease with stage 5 chronic kidney disease or end stage renal disease: Secondary | ICD-10-CM | POA: Diagnosis not present

## 2020-08-30 HISTORY — DX: Anemia, unspecified: D64.9

## 2020-08-30 HISTORY — DX: Gastro-esophageal reflux disease without esophagitis: K21.9

## 2020-08-30 HISTORY — DX: Constipation, unspecified: K59.00

## 2020-08-30 HISTORY — DX: Cardiac arrhythmia, unspecified: I49.9

## 2020-08-30 HISTORY — DX: Unspecified hearing loss, unspecified ear: H91.90

## 2020-08-30 LAB — BASIC METABOLIC PANEL
Anion gap: 17 — ABNORMAL HIGH (ref 5–15)
BUN: 54 mg/dL — ABNORMAL HIGH (ref 6–20)
CO2: 29 mmol/L (ref 22–32)
Calcium: 9.1 mg/dL (ref 8.9–10.3)
Chloride: 92 mmol/L — ABNORMAL LOW (ref 98–111)
Creatinine, Ser: 14.03 mg/dL — ABNORMAL HIGH (ref 0.61–1.24)
GFR, Estimated: 4 mL/min — ABNORMAL LOW (ref >60)
Glucose, Bld: 91 mg/dL (ref 70–99)
Potassium: 4.3 mmol/L (ref 3.5–5.1)
Sodium: 138 mmol/L (ref 135–145)

## 2020-08-30 LAB — CBC WITH DIFFERENTIAL/PLATELET
Abs Immature Granulocytes: 0.01 10*3/uL (ref 0.00–0.07)
Basophils Absolute: 0 10*3/uL (ref 0.0–0.1)
Basophils Relative: 1 %
Eosinophils Absolute: 0.1 10*3/uL (ref 0.0–0.5)
Eosinophils Relative: 3 %
HCT: 27.1 % — ABNORMAL LOW (ref 39.0–52.0)
Hemoglobin: 8.2 g/dL — ABNORMAL LOW (ref 13.0–17.0)
Immature Granulocytes: 0 %
Lymphocytes Relative: 37 %
Lymphs Abs: 1.3 10*3/uL (ref 0.7–4.0)
MCH: 28.2 pg (ref 26.0–34.0)
MCHC: 30.3 g/dL (ref 30.0–36.0)
MCV: 93.1 fL (ref 80.0–100.0)
Monocytes Absolute: 0.4 10*3/uL (ref 0.1–1.0)
Monocytes Relative: 11 %
Neutro Abs: 1.7 10*3/uL (ref 1.7–7.7)
Neutrophils Relative %: 48 %
Platelets: 105 10*3/uL — ABNORMAL LOW (ref 150–400)
RBC: 2.91 MIL/uL — ABNORMAL LOW (ref 4.22–5.81)
RDW: 15.2 % (ref 11.5–15.5)
WBC: 3.6 10*3/uL — ABNORMAL LOW (ref 4.0–10.5)
nRBC: 0.6 % — ABNORMAL HIGH (ref 0.0–0.2)

## 2020-08-30 SURGERY — ARTERIOVENOUS (AV) FISTULA CREATION
Anesthesia: Choice | Laterality: Right

## 2020-08-30 MED ORDER — EPHEDRINE 5 MG/ML INJ
INTRAVENOUS | Status: AC
  Filled 2020-08-30: qty 10

## 2020-08-30 MED ORDER — PROPOFOL 10 MG/ML IV BOLUS
INTRAVENOUS | Status: AC
  Filled 2020-08-30: qty 40

## 2020-08-30 MED ORDER — ONDANSETRON HCL 4 MG/2ML IJ SOLN
INTRAMUSCULAR | Status: AC
  Filled 2020-08-30: qty 2

## 2020-08-30 MED ORDER — CLONIDINE HCL 0.2 MG PO TABS
0.3000 mg | ORAL_TABLET | Freq: Once | ORAL | Status: AC
  Administered 2020-08-30: 0.3 mg via ORAL
  Filled 2020-08-30: qty 1

## 2020-08-30 MED ORDER — PHENYLEPHRINE 40 MCG/ML (10ML) SYRINGE FOR IV PUSH (FOR BLOOD PRESSURE SUPPORT)
PREFILLED_SYRINGE | INTRAVENOUS | Status: AC
  Filled 2020-08-30: qty 10

## 2020-08-30 MED ORDER — DEXAMETHASONE SODIUM PHOSPHATE 10 MG/ML IJ SOLN
INTRAMUSCULAR | Status: AC
  Filled 2020-08-30: qty 1

## 2020-08-30 MED ORDER — LIDOCAINE 2% (20 MG/ML) 5 ML SYRINGE
INTRAMUSCULAR | Status: AC
  Filled 2020-08-30: qty 5

## 2020-08-30 MED ORDER — ROCURONIUM BROMIDE 10 MG/ML (PF) SYRINGE
PREFILLED_SYRINGE | INTRAVENOUS | Status: AC
  Filled 2020-08-30: qty 10

## 2020-08-30 MED ORDER — MIDAZOLAM HCL 2 MG/2ML IJ SOLN
INTRAMUSCULAR | Status: AC
  Filled 2020-08-30: qty 2

## 2020-08-30 MED ORDER — FENTANYL CITRATE (PF) 250 MCG/5ML IJ SOLN
INTRAMUSCULAR | Status: AC
  Filled 2020-08-30: qty 5

## 2020-08-30 MED ORDER — SUCCINYLCHOLINE CHLORIDE 200 MG/10ML IV SOSY
PREFILLED_SYRINGE | INTRAVENOUS | Status: AC
  Filled 2020-08-30: qty 10

## 2020-08-30 NOTE — ED Triage Notes (Signed)
Patient had a syncopal episode this morning while at registration for AV fistula placement today , Alert and oriented at arrival , denies pain /respirations unlabored .

## 2020-08-30 NOTE — ED Notes (Signed)
Asked pt if he took his daily medications today. Pt states yes. Explained to pt bp is high and is he sure. Difficult to communicate with pt. Dr Melina Copa aware

## 2020-08-30 NOTE — ED Provider Notes (Signed)
[place] EMERGENCY DEPARTMENT Provider Note   CSN: [id number] Arrival date & time: 08/30/20  0630     History Chief Complaint  Patient presents with  . Syncope    John Woodard is a 50 y.o. male.  He has a history of A. fib and end-stage renal disease on dialysis Tuesday Thursday Saturday.  He was here today at the hospital for an AV fistula placement.  Currently has a dialysis catheter in his right upper chest.  He was dropped off by his brother.  Reportedly had a syncopal event while registering and was brought down to the emergency department.  He is awake and alert.  Very poor historian.  Denies pain.  I called his brother who dropped him off.  He was not there while the patient had the episode.  He says he is passed out once before.  He attributes it to not having eaten today.  I was able to call surgical day and talk to a nurse up there.  They said that while he was registering at the front desk he fell and hit his head.  There was no syncopal event.  He is still anticipated for surgery today with Dr. Nechama Guard.  The history is provided by the patient and a relative.  Fall The current episode started 1 to 2 hours ago. The problem has not changed since onset.Pertinent negatives include no chest pain, no abdominal pain, no headaches and no shortness of breath. Nothing aggravates the symptoms. Nothing relieves the symptoms. He has tried nothing for the symptoms. The treatment provided no relief.       Past Medical History:  Diagnosis Date  . Anemia   . Atrial fibrillation ([place])   . Constipation   . Dialysis patient ([place])   . Dyspnea   . Dysrhythmia    a-fib  . ESRD on dialysis [facility])    Hemo [place] T, Th , Sat  . GERD (gastroesophageal reflux disease)   . Hard of hearing   . Hypertension   . Poor historian   . Renal disorder     Patient Active Problem List   Diagnosis Date Noted  . Diabetes mellitus due to underlying condition with chronic  kidney disease on chronic dialysis, with long-term current use of insulin ([place]) 08/24/2020  . Deficiency anemia 08/24/2020  . Depression 08/03/2020  . Frequent UTI 08/03/2020  . Hyperparathyroidism ([place]) 08/03/2020  . Iron deficiency anemia 08/03/2020  . Mental retardation 08/03/2020  . Neurogenic bladder 08/03/2020  . Hyperkalemia 03/28/2017  . Uremia 03/28/2017  . Pancytopenia ([place]) 03/28/2017  . GERD (gastroesophageal reflux disease) 03/28/2017  . Hypertension   . ESRD on dialysis ([place])   . Accelerated hypertension 10/30/2016  . Occult blood positive stool 10/30/2016  . Diabetic polyneuropathy associated with type 2 diabetes mellitus ([place]) 03/07/2016  . Edema of foot 03/07/2016  . Onychomycosis due to dermatophyte 03/07/2016    Past Surgical History:  Procedure Laterality Date  . A/V SHUNTOGRAM Left 04/03/2017   Procedure: A/V Shuntogram;  Surgeon: Elam Dutch, MD;  Location: [place] CV LAB;  Service: Cardiovascular;  Laterality: Left;  . AVF Left   . BASCILIC VEIN TRANSPOSITION Left 04/09/2018   Procedure: FIRST STAGE BACILIC VEIN TRANSPOSITION LEFT ARM;  Surgeon: Serafina Mitchell, MD;  Location: [place];  Service: Vascular;  Laterality: Left;  . COLONOSCOPY  2018  . FISTULA SUPERFICIALIZATION Left 06/09/2018   Procedure: FISTULA SUPERFICIALIZATION LEFT UPPER ARM;  Surgeon: Serafina Mitchell, MD;  Location[facility];  Service: Vascular;  Laterality: Left;  . FISTULOGRAM Left 07/16/2017   Procedure: FISTULOGRAM LEFT ARM ARTERIOVENOUS FISTULA;  Surgeon: Serafina Mitchell, MD;  Location: [facility] OR;  Service: Vascular;  Laterality: Left;  . FLEXIBLE SIGMOIDOSCOPY  10/2016  . PERIPHERAL VASCULAR BALLOON ANGIOPLASTY Left 04/03/2017   Procedure: PERIPHERAL VASCULAR BALLOON ANGIOPLASTY;  Surgeon: Elam Dutch, MD;  Location: [place] CV LAB;  Service: Cardiovascular;  Laterality: Left;  Arm AV fistula  . REVISON OF ARTERIOVENOUS FISTULA Left [id number]   Procedure: PLICATION  OF  ARTERIOVENOUS FISTULA  LEFT ARM;  Surgeon: Serafina Mitchell, MD;  Location: [place];  Service: Vascular;  Laterality: Left;  Marland Kitchen VASCULAR SURGERY         Family History  Problem Relation Age of Onset  . Hypertension Mother   . Diabetes Mellitus II Sister     Social History   Tobacco Use  . Smoking status: Never Smoker  . Smokeless tobacco: Never Used  Vaping Use  . Vaping Use: Never used  Substance Use Topics  . Alcohol use: No  . Drug use: No    Home Medications Prior to Admission medications   Medication Sig Start Date End Date Taking? Authorizing Provider  acetaminophen (TYLENOL) 325 MG tablet Take 325 mg by mouth every 6 (six) hours as needed for moderate pain.     [provider]  amLODipine (NORVASC) 5 MG tablet Take 10 mg by mouth at bedtime. 07/06/20   [provider]  cinacalcet (SENSIPAR) 30 MG tablet Take 30 mg by mouth See admin instructions. Tues, Thurs, Sat    [provider]  cloNIDine (CATAPRES) 0.3 MG tablet Take 1 tablet (0.3 mg total) by mouth 3 (three) times daily. 04/07/17   Aline August, MD  docusate sodium (COLACE) 100 MG capsule Take 100 mg by mouth at bedtime.    [provider]  doxazosin (CARDURA) 4 MG tablet Take 4 mg by mouth at bedtime.    [provider]  esomeprazole (NEXIUM) 40 MG capsule Take 40 mg by mouth daily before breakfast.    [provider]  furosemide (LASIX) 40 MG tablet Take 40 mg by mouth daily.    [provider]  HYDROcodone-acetaminophen (NORCO) 5-325 MG tablet Take 1 tablet by mouth every 4 (four) hours as needed for moderate pain. Patient not taking: Reported on 08/29/2020 06/09/18   Ulyses Amor, PA-C  losartan (COZAAR) 100 MG tablet Take 100 mg by mouth daily.    [provider]  metoprolol succinate (TOPROL-XL) 50 MG 24 hr tablet Take 1 tablet (50 mg total) by mouth at bedtime. Take with or immediately following a meal. 04/07/17   Aline August, MD   multivitamin (RENA-VIT) TABS tablet Take 1 tablet by mouth at bedtime.    [provider]  polyethylene glycol powder (GLYCOLAX/MIRALAX) 17 GM/SCOOP powder Take by mouth.    [provider]  sevelamer carbonate (RENVELA) 800 MG tablet Take 800-1,600 mg by mouth See admin instructions. '1600mg'$  three times daily with meals and '800mg'$  with one snack.    [provider]    Allergies    Naproxen, Lactose intolerance (gi), and Penicillins  Review of Systems   Review of Systems  HENT: Negative for sore throat.   Eyes: Negative for visual disturbance.  Respiratory: Negative for shortness of breath.   Cardiovascular: Negative for chest pain.  Gastrointestinal: Negative for abdominal pain.  Genitourinary: Negative for dysuria.  Musculoskeletal: Negative for back pain.  Skin: Negative for rash.  Neurological: Negative for syncope and headaches.    Physical Exam Updated Vital Signs BP (!) 163/122   Pulse 66   Temp 98 F (36.7 C) (Oral)   Resp 14   SpO2 100%   Physical Exam Vitals and nursing note reviewed.  Constitutional:      Appearance: Normal appearance. He is well-developed and well-nourished.  HENT:     Head: Normocephalic and atraumatic.  Eyes:     Conjunctiva/sclera: Conjunctivae normal.  Cardiovascular:     Rate and Rhythm: Normal rate. Rhythm irregular.     Heart sounds: No murmur heard.     Comments: Dialysis catheter right upper chest no signs of infection Pulmonary:     Effort: Pulmonary effort is normal. No respiratory distress.     Breath sounds: Normal breath sounds.  Abdominal:     Palpations: Abdomen is soft.     Tenderness: There is no abdominal tenderness. There is no guarding or rebound.     Comments: Suprapubic catheter  Musculoskeletal:        General: No deformity or signs of injury. Normal range of motion.     Cervical back: Neck supple.     Right lower leg: Edema present.     Left lower leg: Edema present.  Skin:     General: Skin is warm and dry.     Capillary Refill: Capillary refill takes less than 2 seconds.  Neurological:     General: No focal deficit present.     Mental Status: He is alert.  Psychiatric:        Mood and Affect: Mood and affect normal.     ED Results / Procedures / Treatments   Labs (all labs ordered are listed, but only abnormal results are displayed) Labs Reviewed  CBC WITH DIFFERENTIAL/PLATELET - Abnormal; Notable for the following components:      Result Value   WBC 3.6 (*)    RBC 2.91 (*)    Hemoglobin 8.2 (*)    HCT 27.1 (*)    Platelets 105 (*)    nRBC 0.6 (*)    All other components within normal limits  BASIC METABOLIC PANEL - Abnormal; Notable for the following components:   Chloride 92 (*)    BUN 54 (*)    Creatinine, Ser 14.03 (*)    GFR, Estimated 4 (*)    Anion gap 17 (*)    All other components within normal limits    EKG EKG Interpretation  Date/Time:  Thursday August 30 2020 08:10:01 EST Ventricular Rate:  70 PR Interval:    QRS Duration: 85 QT Interval:  421 QTC Calculation: 455 R Axis:   -40 Text Interpretation: Atrial fibrillation Left axis deviation Anterior infarct, old No significant change since last tracing Confirmed by Aletta Edouard [phone]) on 08/30/2020 8:42:57 AM   Radiology CT Head Wo Contrast  Result Date: 08/30/2020 CLINICAL DATA:  Head trauma Abnormal mental status EXAM: CT HEAD WITHOUT CONTRAST TECHNIQUE: Contiguous axial images were obtained from the base of the skull through the vertex without intravenous contrast. COMPARISON:  07/21/2019 FINDINGS: Brain: No evidence of acute infarction, hemorrhage, hydrocephalus, extra-axial collection or mass lesion/mass effect. Diffuse cortical atrophy, advanced for patient's age. Vascular: No hyperdense vessel or unexpected calcification. Skull: Normal. Negative for fracture or focal lesion. Sinuses/Orbits: No acute finding. Other: None. IMPRESSION: No acute intracranial abnormality.  Electronically Signed   By: Miachel Roux M.D.   On: 08/30/2020 10:04    Procedures Procedures  Medications Ordered in ED Medications  cloNIDine (CATAPRES) tablet 0.3 mg (0.3 mg Oral Given 08/30/20 1028)    ED Course  I have reviewed the triage vital signs and the nursing notes.  Pertinent labs & imaging results that were available during my care of the patient were reviewed by me and considered in my medical decision making (see chart for details).  Clinical Course as of 08/30/20 2027  Thu Aug 30, 2020  1012 Received a secure message from Dr. Trula Slade vascular surgery that he is canceled his case.  He may eat and drink. [MB]  [id number] Nobody can come pick up patient.  Brother said he will be there between 430 and 5.  Would be nice to have him here to make sure that the patient is at his baseline before we discharge him. [MB]    Clinical Course User Index [MB] Hayden Rasmussen, MD   MDM Rules/Calculators/A&P                         This patient complains of fall and possible head injury; this involves an extensive number of treatment Options and is a complaint that carries with it a high risk of complications and Morbidity. The differential includes syncope, bleed, hypovolemia, stroke, metabolic derangement, arrhythmia  I ordered, reviewed and interpreted labs, which included CBC with low white count low hemoglobin stable from priors, low platelets slightly worse than normal, chemistries with elevated BUN/creatinine consistent with his renal failure with normal potassium I ordered medication to his routine clonidine for his hypertension I ordered imaging studies which included CT head and I independently    visualized and interpreted imaging which showed no acute findings Additional history obtained from day surgery staff and patient's brother Previous records obtained and reviewed in epic, patient pending new fistula creation  After the interventions stated above, I reevaluated the  patient and found patient to be resting comfortably.  He has been on the cardiac monitor while in the department with no significant abnormalities.  Hypertensive with history of same.  Better than presentation.  His brother is coming up to pick him up.  Signed out to oncoming provider Dr. Vallery Ridge to confirm the patient is at his baseline.  If so patient can be discharged to follow-up with his providers.   Final Clinical Impression(s) / ED Diagnoses Final diagnoses:  Fall, initial encounter  Injury of head, initial encounter    Rx / DC Orders ED Discharge Orders    None       Hayden Rasmussen, MD 08/30/20 2030

## 2020-08-30 NOTE — ED Notes (Signed)
Pt d.c by MD and is provided w/ d/c instructions and follow up care out of the ED in wheel chair

## 2020-08-30 NOTE — ED Notes (Signed)
Pt stood and walked/lipmed on his own around his bed and back. Pt states that his left leg does not hurt but his right heel hurts. Pt denies any n/d.

## 2020-08-30 NOTE — ED Notes (Signed)
Spoke with pt brother [name], will be here 1630-1700. Tot review what pt baseline is

## 2020-08-30 NOTE — Discharge Instructions (Signed)
You were seen in the emergency department for evaluation of injuries after a fall.  You had a CAT scan of your head along with some lab work.  We did not see any significant abnormalities.  You were able to ambulate in the department.  Dr. Trula Slade did not want to do your surgery today and this will need to be rescheduled.  Please contact their office.  Return to the emergency department for any worsening or concerning symptoms

## 2020-09-02 LAB — VITAMIN B1: Vitamin B1 (Thiamine): 18 nmol/L (ref 8–30)

## 2020-09-17 ENCOUNTER — Other Ambulatory Visit (HOSPITAL_COMMUNITY)
Admission: RE | Admit: 2020-09-17 | Discharge: 2020-09-17 | Disposition: A | Discharge status: Home / Self Care | Payer: Medicare Other | Source: Ambulatory Visit | Attending: Surgery | Admitting: Surgery

## 2020-09-17 DIAGNOSIS — Z01812 Encounter for preprocedural laboratory examination: Secondary | ICD-10-CM | POA: Diagnosis present

## 2020-09-17 DIAGNOSIS — Z20822 Contact with and (suspected) exposure to covid-19: Secondary | ICD-10-CM | POA: Diagnosis not present

## 2020-09-18 ENCOUNTER — Encounter (HOSPITAL_COMMUNITY): Payer: Self-pay | Admitting: Surgery

## 2020-09-18 LAB — SARS CORONAVIRUS 2 (TAT 6-24 HRS): SARS Coronavirus 2: NEGATIVE

## 2020-09-18 NOTE — Progress Notes (Signed)
Spoke with pt for pre-op call. Pt is very hard to understand. He kept saying he needed someone from the hospital to pick him up at a bus stop. I told him that I would call his brother [name] and give instructions to him. I did tell pt not to eat or drink after midnight. I then called [name] and he explained that [facility] will be picking pt up in the AM and should have him here no later than 11:45 AM. [name] states it's easier for pt to read instructions instead of being told. [name] states he can pick pt up after surgery, but he cannot get here until after 7 PM due to work

## 2020-09-19 ENCOUNTER — Ambulatory Visit (HOSPITAL_COMMUNITY): Payer: Medicare Other | Admitting: Anesthesiology

## 2020-09-19 ENCOUNTER — Ambulatory Visit (HOSPITAL_COMMUNITY)
Admission: RE | Admit: 2020-09-19 | Discharge: 2020-09-19 | Disposition: A | Discharge status: Home / Self Care | Payer: Medicare Other | Attending: Surgery | Admitting: Surgery

## 2020-09-19 ENCOUNTER — Encounter (HOSPITAL_COMMUNITY)
Admission: RE | Disposition: A | Discharge status: Home / Self Care | Payer: Self-pay | Source: Home / Self Care | Attending: Surgery

## 2020-09-19 ENCOUNTER — Other Ambulatory Visit: Payer: Self-pay

## 2020-09-19 ENCOUNTER — Encounter (HOSPITAL_COMMUNITY): Payer: Self-pay | Admitting: Surgery

## 2020-09-19 DIAGNOSIS — E739 Lactose intolerance, unspecified: Secondary | ICD-10-CM | POA: Diagnosis not present

## 2020-09-19 DIAGNOSIS — Z880 Allergy status to penicillin: Secondary | ICD-10-CM | POA: Diagnosis not present

## 2020-09-19 DIAGNOSIS — I120 Hypertensive chronic kidney disease with stage 5 chronic kidney disease or end stage renal disease: Secondary | ICD-10-CM | POA: Diagnosis not present

## 2020-09-19 DIAGNOSIS — E1122 Type 2 diabetes mellitus with diabetic chronic kidney disease: Secondary | ICD-10-CM | POA: Diagnosis not present

## 2020-09-19 DIAGNOSIS — N186 End stage renal disease: Secondary | ICD-10-CM | POA: Insufficient documentation

## 2020-09-19 DIAGNOSIS — Z833 Family history of diabetes mellitus: Secondary | ICD-10-CM | POA: Insufficient documentation

## 2020-09-19 DIAGNOSIS — T82868A Thrombosis of vascular prosthetic devices, implants and grafts, initial encounter: Secondary | ICD-10-CM | POA: Insufficient documentation

## 2020-09-19 DIAGNOSIS — Z8249 Family history of ischemic heart disease and other diseases of the circulatory system: Secondary | ICD-10-CM | POA: Insufficient documentation

## 2020-09-19 DIAGNOSIS — Z79899 Other long term (current) drug therapy: Secondary | ICD-10-CM | POA: Diagnosis not present

## 2020-09-19 DIAGNOSIS — X58XXXA Exposure to other specified factors, initial encounter: Secondary | ICD-10-CM | POA: Diagnosis not present

## 2020-09-19 DIAGNOSIS — Z886 Allergy status to analgesic agent status: Secondary | ICD-10-CM | POA: Insufficient documentation

## 2020-09-19 DIAGNOSIS — Y848 Other medical procedures as the cause of abnormal reaction of the patient, or of later complication, without mention of misadventure at the time of the procedure: Secondary | ICD-10-CM | POA: Diagnosis not present

## 2020-09-19 DIAGNOSIS — I4891 Unspecified atrial fibrillation: Secondary | ICD-10-CM | POA: Insufficient documentation

## 2020-09-19 DIAGNOSIS — Z992 Dependence on renal dialysis: Secondary | ICD-10-CM | POA: Insufficient documentation

## 2020-09-19 DIAGNOSIS — N185 Chronic kidney disease, stage 5: Secondary | ICD-10-CM | POA: Diagnosis not present

## 2020-09-19 HISTORY — PX: AV FISTULA PLACEMENT: SHX1204

## 2020-09-19 LAB — POCT I-STAT, CHEM 8
BUN: 46 mg/dL — ABNORMAL HIGH (ref 6–20)
Calcium, Ion: 1.03 mmol/L — ABNORMAL LOW (ref 1.15–1.40)
Chloride: 96 mmol/L — ABNORMAL LOW (ref 98–111)
Creatinine, Ser: 12.9 mg/dL — ABNORMAL HIGH (ref 0.61–1.24)
Glucose, Bld: 78 mg/dL (ref 70–99)
HCT: 33 % — ABNORMAL LOW (ref 39.0–52.0)
Hemoglobin: 11.2 g/dL — ABNORMAL LOW (ref 13.0–17.0)
Potassium: 5.1 mmol/L (ref 3.5–5.1)
Sodium: 137 mmol/L (ref 135–145)
TCO2: 29 mmol/L (ref 22–32)

## 2020-09-19 SURGERY — ARTERIOVENOUS (AV) FISTULA CREATION
Anesthesia: General | Site: Arm Lower | Laterality: Right

## 2020-09-19 MED ORDER — LIDOCAINE-EPINEPHRINE (PF) 1 %-1:200000 IJ SOLN
INTRAMUSCULAR | Status: AC
  Filled 2020-09-19: qty 30

## 2020-09-19 MED ORDER — FENTANYL CITRATE (PF) 250 MCG/5ML IJ SOLN
INTRAMUSCULAR | Status: AC
  Filled 2020-09-19: qty 5

## 2020-09-19 MED ORDER — SODIUM CHLORIDE 0.9 % IV SOLN
INTRAVENOUS | Status: DC | PRN
  Administered 2020-09-19: 15:00:00 500 mL

## 2020-09-19 MED ORDER — PHENYLEPHRINE HCL (PRESSORS) 10 MG/ML IV SOLN
INTRAVENOUS | Status: DC | PRN
  Administered 2020-09-19 (×2): 80 ug via INTRAVENOUS

## 2020-09-19 MED ORDER — VANCOMYCIN HCL IN DEXTROSE 1-5 GM/200ML-% IV SOLN
INTRAVENOUS | Status: AC
  Administered 2020-09-19: 1000 mg via INTRAVENOUS
  Filled 2020-09-19: qty 200

## 2020-09-19 MED ORDER — PROPOFOL 10 MG/ML IV BOLUS
INTRAVENOUS | Status: AC
  Filled 2020-09-19: qty 20

## 2020-09-19 MED ORDER — ONDANSETRON HCL 4 MG/2ML IJ SOLN
INTRAMUSCULAR | Status: DC | PRN
  Administered 2020-09-19: 4 mg via INTRAVENOUS

## 2020-09-19 MED ORDER — PROPOFOL 10 MG/ML IV BOLUS
INTRAVENOUS | Status: DC | PRN
  Administered 2020-09-19: 150 mg via INTRAVENOUS

## 2020-09-19 MED ORDER — VANCOMYCIN HCL IN DEXTROSE 1-5 GM/200ML-% IV SOLN: 1000.0000 mg | INTRAVENOUS | Status: AC

## 2020-09-19 MED ORDER — HEPARIN SODIUM (PORCINE) 1000 UNIT/ML IJ SOLN
1.9000 mL | Freq: Once | INTRAMUSCULAR | Status: AC
  Administered 2020-09-19: 1900 [IU] via INTRAVENOUS
  Filled 2020-09-19: qty 1.9

## 2020-09-19 MED ORDER — SODIUM CHLORIDE 0.9 % IV SOLN: INTRAVENOUS | Status: DC

## 2020-09-19 MED ORDER — 0.9 % SODIUM CHLORIDE (POUR BTL) OPTIME
TOPICAL | Status: DC | PRN
  Administered 2020-09-19: 1000 mL

## 2020-09-19 MED ORDER — DEXAMETHASONE SODIUM PHOSPHATE 10 MG/ML IJ SOLN
INTRAMUSCULAR | Status: DC | PRN
  Administered 2020-09-19: 5 mg via INTRAVENOUS

## 2020-09-19 MED ORDER — CHLORHEXIDINE GLUCONATE 4 % EX LIQD: 60.0000 mL | Freq: Once | CUTANEOUS | Status: DC

## 2020-09-19 MED ORDER — SODIUM CHLORIDE 0.9 % IV SOLN
INTRAVENOUS | Status: AC
  Filled 2020-09-19: qty 1.2

## 2020-09-19 MED ORDER — CHLORHEXIDINE GLUCONATE 0.12 % MT SOLN
OROMUCOSAL | Status: AC
  Administered 2020-09-19: 15 mL
  Filled 2020-09-19: qty 15

## 2020-09-19 MED ORDER — FENTANYL CITRATE (PF) 100 MCG/2ML IJ SOLN
INTRAMUSCULAR | Status: DC | PRN
  Administered 2020-09-19 (×2): 25 ug via INTRAVENOUS

## 2020-09-19 MED ORDER — HYDROCODONE-ACETAMINOPHEN 5-325 MG PO TABS: 1.0000 | ORAL_TABLET | Freq: Four times a day (QID) | ORAL | 0 refills | Status: DC | PRN

## 2020-09-19 SURGICAL SUPPLY — 33 items
ARMBAND PINK RESTRICT EXTREMIT (MISCELLANEOUS) ×4 IMPLANT
CANISTER SUCT 3000ML PPV (MISCELLANEOUS) ×2 IMPLANT
CLIP VESOCCLUDE MED 6/CT (CLIP) ×2 IMPLANT
CLIP VESOCCLUDE SM WIDE 6/CT (CLIP) ×2 IMPLANT
COVER PROBE W GEL 5X96 (DRAPES) ×2 IMPLANT
COVER WAND RF STERILE (DRAPES) IMPLANT
DERMABOND ADHESIVE PROPEN (GAUZE/BANDAGES/DRESSINGS) ×1
DERMABOND ADVANCED (GAUZE/BANDAGES/DRESSINGS) ×1
DERMABOND ADVANCED .7 DNX12 (GAUZE/BANDAGES/DRESSINGS) ×1 IMPLANT
DERMABOND ADVANCED .7 DNX6 (GAUZE/BANDAGES/DRESSINGS) ×1 IMPLANT
ELECT REM PT RETURN 9FT ADLT (ELECTROSURGICAL) ×2
ELECTRODE REM PT RTRN 9FT ADLT (ELECTROSURGICAL) ×1 IMPLANT
GLOVE BIOGEL PI IND STRL 7.5 (GLOVE) ×1 IMPLANT
GLOVE BIOGEL PI INDICATOR 7.5 (GLOVE) ×1
GLOVE SURG SS PI 7.5 STRL IVOR (GLOVE) ×2 IMPLANT
GLOVE SURG UNDER POLY LF SZ6.5 (GLOVE) ×6 IMPLANT
GOWN STRL REUS W/ TWL LRG LVL3 (GOWN DISPOSABLE) ×2 IMPLANT
GOWN STRL REUS W/ TWL XL LVL3 (GOWN DISPOSABLE) ×1 IMPLANT
GOWN STRL REUS W/TWL LRG LVL3 (GOWN DISPOSABLE) ×4
GOWN STRL REUS W/TWL XL LVL3 (GOWN DISPOSABLE) ×2
HEMOSTAT SNOW SURGICEL 2X4 (HEMOSTASIS) IMPLANT
KIT BASIN OR (CUSTOM PROCEDURE TRAY) ×2 IMPLANT
KIT TURNOVER KIT B (KITS) ×2 IMPLANT
NS IRRIG 1000ML POUR BTL (IV SOLUTION) ×2 IMPLANT
PACK CV ACCESS (CUSTOM PROCEDURE TRAY) ×2 IMPLANT
PAD ARMBOARD 7.5X6 YLW CONV (MISCELLANEOUS) ×4 IMPLANT
SUT PROLENE 6 0 CC (SUTURE) ×4 IMPLANT
SUT VIC AB 3-0 SH 27 (SUTURE) ×2
SUT VIC AB 3-0 SH 27X BRD (SUTURE) ×1 IMPLANT
SUT VICRYL 4-0 PS2 18IN ABS (SUTURE) ×2 IMPLANT
TOWEL GREEN STERILE (TOWEL DISPOSABLE) ×2 IMPLANT
UNDERPAD 30X36 HEAVY ABSORB (UNDERPADS AND DIAPERS) ×2 IMPLANT
WATER STERILE IRR 1000ML POUR (IV SOLUTION) ×2 IMPLANT

## 2020-09-19 NOTE — Op Note (Signed)
    Patient name: Sahil Sum MRN: [id number] DOB: [birth year] Sex: male  09/19/2020 Pre-operative Diagnosis: ESRD Post-operative diagnosis:  Same Surgeon:  [name] Assistants: Aldona Bar Ryne Procedure:   Right radiocephalic fistula Anesthesia: General Blood Loss: Minimal Specimens: None  Findings: 3 mm cephalic vein at the wrist.  2.5 mm radial artery with mild calcification  Indications: The patient comes in today for new dialysis access.  Procedure:  The patient was identified in the holding area and taken to [place] [facility]  The patient was then placed supine on the table. general anesthesia was administered.  The patient was prepped and draped in the usual sterile fashion.  A time out was called and antibiotics were administered.  A PA was necessary to expedite the procedure and assist with technical details.  Ultrasound was used to evaluate the cephalic vein in the upper and lower arm.  It appeared to be adequate for radiocephalic fistula.  A longitudinal incision was made just proximal to the wrist.  I first dissected out the cephalic vein which was a 3 mm vein throughout.  Several side branches were ligated between silk ties.  The vein was marked for orientation.  I then dissected out the radial artery.  This was a 2.5 mm artery with mild calcification.  The cephalic vein was ligated distally with a 2-0 silk tie.  It distended nicely with heparin saline.  Next, the radial artery was occluded with Serafin clamps.  A #11 blade was used to make an arteriotomy which was extended longitudinally with Potts scissors.  The vein was cut to the appropriate length and spatulated to fit the size the arteriotomy.  A running anastomosis was created with 6-0 Prolene.  Prior to completion the appropriate flushing maneuvers were performed and the anastomosis was completed.  I then inspected the course of the vein to make sure there were no kinks.  There was an excellent thrill within the fistula and a  brisk radial artery Doppler signal distal to the fistula and a brisk ulnar Doppler signal.  The wound was then irrigated.  Hemostasis was achieved.  The incision was closed with 2 layers of Vicryl followed by Dermabond.  There were no immediate complications.   Disposition: To PACU stable.   Theotis Burrow, M.D., [facility] [place] Office: [phone] Pager:  [phone]

## 2020-09-19 NOTE — Discharge Instructions (Signed)
   [facility]  Discharge Instructions  AV Fistula or Graft Surgery for Dialysis Access  Please refer to the following instructions for your post-procedure care. Your surgeon or physician assistant will discuss any changes with you.  Activity  You may drive the day following your surgery, if you are comfortable and no longer taking prescription pain medication. Resume full activity as the soreness in your incision resolves.  Bathing/Showering  You may shower after you go home. Keep your incision dry for 48 hours. Do not soak in a bathtub, hot tub, or swim until the incision heals completely. You may not shower if you have a hemodialysis catheter.  Incision Care  Clean your incision with mild soap and water after 48 hours. Pat the area dry with a clean towel. You do not need a bandage unless otherwise instructed. Do not apply any ointments or creams to your incision. You may have skin glue on your incision. Do not peel it off. It will come off on its own in about one week. Your arm may swell a bit after surgery. To reduce swelling use pillows to elevate your arm so it is above your heart. Your doctor will tell you if you need to lightly wrap your arm with an ACE bandage.  Diet  Resume your normal diet. There are not special food restrictions following this procedure. In order to heal from your surgery, it is CRITICAL to get adequate nutrition. Your body requires vitamins, minerals, and protein. Vegetables are the best source of vitamins and minerals. Vegetables also provide the perfect balance of protein. Processed food has little nutritional value, so try to avoid this.  Medications  Resume taking all of your medications. If your incision is causing pain, you may take over-the counter pain relievers such as acetaminophen (Tylenol). If you were prescribed a stronger pain medication, please be aware these medications can cause nausea and constipation. Prevent  nausea by taking the medication with a snack or meal. Avoid constipation by drinking plenty of fluids and eating foods with high amount of fiber, such as fruits, vegetables, and grains.  Do not take Tylenol if you are taking prescription pain medications.  Follow up Your surgeon may want to see you in the office following your access surgery. If so, this will be arranged at the time of your surgery.  Please call us immediately for any of the following conditions:  . Increased pain, redness, drainage (pus) from your incision site . Fever of 101 degrees or higher . Severe or worsening pain at your incision site . Hand pain or numbness. .  Reduce your risk of vascular disease:  . Stop smoking. If you would like help, call [facility] at 1-800-QUIT-NOW 785-012-1900) or [place] at (478)056-7460  . Manage your cholesterol . Maintain a desired weight . Control your diabetes . Keep your blood pressure down  Dialysis  It will take several weeks to several months for your new dialysis access to be ready for use. Your surgeon will determine when it is okay to use it. Your nephrologist will continue to direct your dialysis. You can continue to use your Permcath until your new access is ready for use.   09/19/2020 John Woodard [id number] [birth year]  Surgeon(s): Serafina Mitchell, MD  Procedure(s): Right radial cephalic AV fistula creation   x Do not stick fistula for 12 weeks    If you have any questions, please call the office at [phone].

## 2020-09-19 NOTE — Anesthesia Procedure Notes (Signed)
Procedure Name: LMA Insertion Date/Time: 09/19/2020 3:25 PM Performed by: Babs Bertin, CRNA Pre-anesthesia Checklist: Patient identified, Emergency Drugs available, Suction available and Patient being monitored Patient Re-evaluated:Patient Re-evaluated prior to induction Oxygen Delivery Method: Circle System Utilized Preoxygenation: Pre-oxygenation with 100% oxygen Induction Type: IV induction Ventilation: Mask ventilation without difficulty LMA: LMA inserted LMA Size: 4.0 Number of attempts: 1 Airway Equipment and Method: Bite block Placement Confirmation: positive ETCO2 Tube secured with: Tape Dental Injury: Teeth and Oropharynx as per pre-operative assessment

## 2020-09-19 NOTE — Anesthesia Preprocedure Evaluation (Signed)
Anesthesia Evaluation  Patient identified by MRN, date of birth, ID band Patient awake    Reviewed: Allergy & Precautions, NPO status , Patient's Chart, lab work & pertinent test results, reviewed documented beta blocker date and time   Airway Mallampati: II  TM Distance: >3 FB Neck ROM: Full    Dental  (+) Teeth Intact   Pulmonary    Pulmonary exam normal        Cardiovascular hypertension, Pt. on medications and Pt. on home beta blockers + dysrhythmias Atrial Fibrillation  Rhythm:Regular Rate:Normal     Neuro/Psych Depression negative neurological ROS     GI/Hepatic Neg liver ROS, GERD  Medicated,  Endo/Other  diabetes  Renal/GU ESRF and DialysisRenal disease  negative genitourinary   Musculoskeletal negative musculoskeletal ROS (+)   Abdominal (+)  Abdomen: soft. Bowel sounds: normal.  Peds  Hematology  (+) anemia ,   Anesthesia Other Findings   Reproductive/Obstetrics                             Anesthesia Physical Anesthesia Plan  ASA: III  Anesthesia Plan: General   Post-op Pain Management:    Induction: Intravenous  PONV Risk Score and Plan: 2 and Ondansetron, Dexamethasone, Midazolam and Treatment may vary due to age or medical condition  Airway Management Planned: Mask and LMA  Additional Equipment: None  Intra-op Plan:   Post-operative Plan: Extubation in OR  Informed Consent: I have reviewed the patients History and Physical, chart, labs and discussed the procedure including the risks, benefits and alternatives for the proposed anesthesia with the patient or authorized representative who has indicated his/her understanding and acceptance.     Dental advisory given  Plan Discussed with: CRNA  Anesthesia Plan Comments: (ECHO 2018: - Left ventricle: The cavity size was normal. There was severe  concentric hypertrophy. Systolic function was vigorous. The   estimated ejection fraction was in the range of 65% to 70%. Wall  motion was normal; there were no regional wall motion  abnormalities. Features are consistent with a pseudonormal left  ventricular filling pattern, with concomitant abnormal relaxation  and increased filling pressure (grade 2 diastolic dysfunction).  - Aortic valve: There was mild regurgitation.  - Mitral valve: There was mild regurgitation.  - Left atrium: The atrium was severely dilated.  - Right atrium: The atrium was severely dilated.  - Tricuspid valve: There was moderate regurgitation.  Lab Results      Component                Value               Date                      WBC                      3.6 (L)             08/30/2020                HGB                      8.2 (L)             08/30/2020                HCT  27.1 (L)            08/30/2020                MCV                      93.1                08/30/2020                PLT                      105 (L)             08/30/2020           Lab Results      Component                Value               Date                      NA                       138                 08/30/2020                K                        4.3                 08/30/2020                CO2                      29                  08/30/2020                GLUCOSE                  91                  08/30/2020                BUN                      54 (H)              08/30/2020                CREATININE               14.03 (H)           08/30/2020                CALCIUM                  9.1                 08/30/2020                GFRNONAA                 4 (L)               08/30/2020  GFRAA                    4 (L)               07/21/2019          )        Anesthesia Quick Evaluation

## 2020-09-19 NOTE — Progress Notes (Signed)
Vanc '1000mg'$  started at 1320.  Unable to link due to HD catheter not being documented as an option.

## 2020-09-19 NOTE — H&P (Signed)
Office Note     CC:  follow up Requesting Provider:  No ref. provider found  HPI: John Woodard is a 50 y.o. (05/29/1971) male who presents for re-evaluation ofpermanent access. He is s/p second stage basilic vein transposition by Dr. Samuel Bouche 06/09/2018.He was referred to our office due to scabbing overlying left arm AV fistula and was last seen on 08/03/20. At that appointment he was scheduled for Plication of this area, which was originally planned for today in the OR. However, on I/18 Dr. Johnney Ou called to inform [facility] that the patients fistula had since thrombosed and they had placed a TDC so that he could dialyze.   He presents today for upper extremity vein mapping in preparation for new access placement.  Patient comes to office by himself dropped off by [facility]. He is extremely hard of hearing and only able to communicate in writing. I did my best of my ability to do this, but patient is also a poor historian and expressed extensive frustration and confusion about why he is here today and why his other accesses have failed.  The patient is currently dialyzing via right IJ TDC on a TTS schedule. His ESRD is managed by Dr. Justin Mend.     Past Medical History:  Diagnosis Date  . Atrial fibrillation ([place])   . Dialysis patient ([place])   . Dyspnea   . ESRD on dialysis [facility])    [place]  . Hypertension   . Poor historian   . Renal disorder          Past Surgical History:  Procedure Laterality Date  . A/V SHUNTOGRAM Left 04/03/2017   Procedure: A/V Shuntogram;  Surgeon: Elam Dutch, MD;  Location: [place] [facility];  Service: Cardiovascular;  Laterality: Left;  . AVF Left   . BASCILIC VEIN TRANSPOSITION Left 04/09/2018   Procedure: FIRST STAGE BACILIC VEIN TRANSPOSITION LEFT ARM;  Surgeon: Serafina Mitchell, MD;  Location: [place];  Service: Vascular;  Laterality: Left;  . FISTULA SUPERFICIALIZATION Left 06/09/2018   Procedure: FISTULA SUPERFICIALIZATION  LEFT UPPER ARM;  Surgeon: Serafina Mitchell, MD;  Location: [place];  Service: Vascular;  Laterality: Left;  . FISTULOGRAM Left 07/16/2017   Procedure: FISTULOGRAM LEFT ARM ARTERIOVENOUS FISTULA;  Surgeon: Serafina Mitchell, MD;  Location: [facility] OR;  Service: Vascular;  Laterality: Left;  . PERIPHERAL VASCULAR BALLOON ANGIOPLASTY Left 04/03/2017   Procedure: PERIPHERAL VASCULAR BALLOON ANGIOPLASTY;  Surgeon: Elam Dutch, MD;  Location: [place] [facility];  Service: Cardiovascular;  Laterality: Left;  Arm AV fistula  . REVISON OF ARTERIOVENOUS FISTULA Left [id number]   Procedure: PLICATION  OF ARTERIOVENOUS FISTULA  LEFT ARM;  Surgeon: Serafina Mitchell, MD;  Location: [place];  Service: Vascular;  Laterality: Left;  Marland Kitchen VASCULAR SURGERY      Social History        Socioeconomic History  . Marital status: Single    Spouse name: Not on file  . Number of children: Not on file  . Years of education: Not on file  . Highest education level: Not on file  Occupational History  . Not on file  Tobacco Use  . Smoking status: Never Smoker  . Smokeless tobacco: Never Used  Substance and Sexual Activity  . Alcohol use: No  . Drug use: No  . Sexual activity: Not on file  Other Topics Concern  . Not on file  Social History Narrative  . Not on file   Social Determinants of Health  Financial Resource Strain: Not on file  Food Insecurity: Not on file  Transportation Needs: Not on file  Physical Activity: Not on file  Stress: Not on file  Social Connections: Not on file  Intimate Partner Violence: Not on file         Family History  Problem Relation Age of Onset  . Hypertension Mother   . Diabetes Mellitus II Sister           Current Outpatient Medications  Medication Sig Dispense Refill  . acetaminophen (TYLENOL) 325 MG tablet Take 325 mg by mouth every 6 (six) hours as needed for moderate pain.     Marland Kitchen amLODipine (NORVASC) 10 MG tablet Take 10 mg by mouth at bedtime.     Marland Kitchen amLODipine (NORVASC) 5 MG tablet Take 10 mg by mouth at bedtime.    . cinacalcet (SENSIPAR) 30 MG tablet Take 30 mg by mouth every Monday, Wednesday, and Friday.     . cloNIDine (CATAPRES) 0.3 MG tablet Take 1 tablet (0.3 mg total) by mouth 3 (three) times daily. 90 tablet 0  . docusate sodium (COLACE) 100 MG capsule Take 100 mg by mouth 2 (two) times daily as needed for constipation.    Marland Kitchen doxazosin (CARDURA) 4 MG tablet Take 4 mg by mouth at bedtime.    Marland Kitchen esomeprazole (NEXIUM) 40 MG capsule Take 40 mg by mouth daily before breakfast.    . furosemide (LASIX) 40 MG tablet Take 40 mg by mouth daily.    Marland Kitchen HYDROcodone-acetaminophen (NORCO) 5-325 MG tablet Take 1 tablet by mouth every 4 (four) hours as needed for moderate pain. 15 tablet 0  . losartan (COZAAR) 100 MG tablet Take 100 mg by mouth daily.    . metoprolol succinate (TOPROL-XL) 50 MG 24 hr tablet Take 1 tablet (50 mg total) by mouth at bedtime. Take with or immediately following a meal. 30 tablet 0  . multivitamin (RENA-VIT) TABS tablet Take 1 tablet by mouth daily.    . polyethylene glycol powder (GLYCOLAX/MIRALAX) 17 GM/SCOOP powder Take by mouth.    . sevelamer carbonate (RENVELA) 800 MG tablet Take 800-1,600 mg by mouth See admin instructions. '1600mg'$  three times daily with meals and '800mg'$  with one snack.     No current facility-administered medications for this visit.         Allergies  Allergen Reactions  . Naproxen Anaphylaxis and Other (See Comments)    CARDIAC ARREST "heart stopped one time"  . Lactose Intolerance (Gi) Diarrhea and Other (See Comments)    BLOODY STOOLS  . Penicillins     UNSPECIFIED REACTION      REVIEW OF SYSTEMS:  Negative unless stated in HPI Very difficult to obtain due to patients status   PHYSICAL EXAMINATION:     Vitals:   08/17/20 0849  BP: 127/85  Pulse: 88  Resp: 20  Temp: 98.5 F (36.9 C)  TempSrc: Temporal  SpO2: 96%  Weight: 183 lb  12.8 oz (83.4 kg)  Height: '6\' 2"'$  (1.88 m)    General:  WDWN in NAD; vital signs documented above Gait: Normal HENT: WNL, normocephalic Pulmonary: normal non-labored breathing Cardiac: regular HR, without  Murmurs Vascular Exam/Pulses:2+ radial pulses bilaterally, hands warm. 5/5 grip strength bilaterally Left BC fistula thrombosed. No palpable thrill. No audible bruit. Old occluded left radiocephalic fistula  Musculoskeletal: no muscle wasting or atrophy       Neurologic: A&O X 3;  No focal weakness or paresthesias are detected Psychiatric:  The pt has Normal  affect.   Non-Invasive Vascular Imaging:   Independently reviewed right upper extremity arterial duplex which shows patent ulnar, radial and brachial arteries with triphasic flow  +-----------------+-------------+----------+--------+  Right Cephalic  Diameter (cm)Depth (cm)Findings  +-----------------+-------------+----------+--------+  Shoulder       0.45              +-----------------+-------------+----------+--------+  Prox upper arm    0.45              +-----------------+-------------+----------+--------+  Mid upper arm    0.49              +-----------------+-------------+----------+--------+  Dist upper arm    0.40              +-----------------+-------------+----------+--------+  Antecubital fossa  0.54              +-----------------+-------------+----------+--------+  Prox forearm     0.32              +-----------------+-------------+----------+--------+  Mid forearm     0.34              +-----------------+-------------+----------+--------+  Dist forearm     0.40              +-----------------+-------------+----------+--------+   +-----------------+-------------+----------+--------------------+  Right Basilic  Diameter (cm)Depth (cm)    Findings     +-----------------+-------------+----------+--------------------+  Prox upper arm    0.79                    +-----------------+-------------+----------+--------------------+  Mid upper arm    0.58                    +-----------------+-------------+----------+--------------------+  Dist upper arm    0.63                    +-----------------+-------------+----------+--------------------+  Antecubital fossa  0.46        large branch 0.56 cm  +-----------------+-------------+----------+--------------------+   Summary: Right: Patent cephalic and basilic veins.    ASSESSMENT/PLAN:: 50 y.o. male here for follow up for evaluation of new permanent access.  Left brachiocephalic AV fistula now occluded. Duplex today shows patent right upper extremity arteries and adequate vein conduits for right upper extremity fistula. He would best be suited for a single stage BC fistula. He is currently dialyzing on TTS via right IJ TDC. -Will have him scheduled for right upper extremity first stage Brachiocephalic AV fistula by Dr. Marcell Anger, PA-C [facility] [phone]  Clinic MD:  Dr. Donzetta Matters   No complaints CV:RRR PULM:CTA ABD SOFT Plan right arm fistula..All  questions answere  WB

## 2020-09-19 NOTE — Transfer of Care (Signed)
Immediate Anesthesia Transfer of Care Note  Patient: John Woodard  Procedure(s) Performed: RIGHT UPPER EXTREMITY FIRST STAGE BRACHIOCEPHALIC ARTERIOVENOUS (AV) FISTULA CREATION (Right Arm Lower)  Patient Location: PACU  Anesthesia Type:General  Level of Consciousness: responds to stimulation  Airway & Oxygen Therapy: Patient Spontanous Breathing and Patient connected to face mask oxygen  Post-op Assessment: Report given to RN and Post -op Vital signs reviewed and stable  Post vital signs: Reviewed and stable  Last Vitals:  Vitals Value Taken Time  BP 133/77 09/19/20 1633  Temp    Pulse 72 09/19/20 1634  Resp 9 09/19/20 1634  SpO2 100 % 09/19/20 1634  Vitals shown include unvalidated device data.  Last Pain:  Vitals:   09/19/20 1241  TempSrc:   PainSc: 0-No pain         Complications: No complications documented.

## 2020-09-20 ENCOUNTER — Encounter (HOSPITAL_COMMUNITY): Payer: Self-pay | Admitting: Surgery

## 2020-09-20 NOTE — Anesthesia Postprocedure Evaluation (Signed)
Anesthesia Post Note  Patient: Haider Carrera  Procedure(s) Performed: RIGHT UPPER EXTREMITY FIRST STAGE BRACHIOCEPHALIC ARTERIOVENOUS (AV) FISTULA CREATION (Right Arm Lower)     Patient location during evaluation: PACU Anesthesia Type: General Level of consciousness: awake and alert Pain management: pain level controlled Vital Signs Assessment: post-procedure vital signs reviewed and stable Respiratory status: spontaneous breathing, nonlabored ventilation, respiratory function stable and patient connected to nasal cannula oxygen Cardiovascular status: blood pressure returned to baseline and stable Postop Assessment: no apparent nausea or vomiting Anesthetic complications: no   No complications documented.  Last Vitals:  Vitals:   09/19/20 1903 09/19/20 1907  BP: (!) 143/71   Pulse: 80 79  Resp: (!) 9 11  Temp: 36.7 C   SpO2: 100% 100%    Last Pain:  Vitals:   09/19/20 1903  TempSrc:   PainSc: 0-No pain                 Belenda Cruise P Isael Stille

## 2020-10-01 ENCOUNTER — Encounter (HOSPITAL_COMMUNITY): Payer: Medicare Other

## 2020-10-17 ENCOUNTER — Other Ambulatory Visit: Payer: Self-pay | Admitting: *Deleted

## 2020-10-17 DIAGNOSIS — N186 End stage renal disease: Secondary | ICD-10-CM

## 2020-10-22 ENCOUNTER — Encounter (HOSPITAL_COMMUNITY): Payer: Medicare Other

## 2020-10-29 ENCOUNTER — Ambulatory Visit (HOSPITAL_COMMUNITY): Payer: Medicare Other | Attending: Vascular Surgery

## 2020-11-07 ENCOUNTER — Other Ambulatory Visit: Payer: Self-pay

## 2020-11-07 ENCOUNTER — Ambulatory Visit (INDEPENDENT_AMBULATORY_CARE_PROVIDER_SITE_OTHER): Payer: Medicare Other | Admitting: Physician Assistant

## 2020-11-07 ENCOUNTER — Ambulatory Visit (HOSPITAL_COMMUNITY)
Admission: RE | Admit: 2020-11-07 | Discharge: 2020-11-07 | Disposition: A | Discharge status: Home / Self Care | Payer: Medicare Other | Source: Ambulatory Visit | Attending: Vascular Surgery | Admitting: Vascular Surgery

## 2020-11-07 VITALS — BP 151/82 | HR 78 | Temp 98.7°F | Resp 20 | Ht 74.0 in | Wt 184.5 lb

## 2020-11-07 DIAGNOSIS — Z992 Dependence on renal dialysis: Secondary | ICD-10-CM

## 2020-11-07 DIAGNOSIS — N186 End stage renal disease: Secondary | ICD-10-CM | POA: Insufficient documentation

## 2020-11-07 NOTE — Progress Notes (Signed)
    Postoperative Access Visit   History of Present Illness   John Woodard is a 50 y.o. year old male who presents for postoperative follow-up for: right radiocephalic fistula by Dr. Trula Slade on 09/19/20. He was initially scheduled for a plication of his left upper extremity BV fistula however he presented with the fistula thrombosed and a TDC. He was subsequently evaluated for new access. The patient's wounds are healed.  The patient notes no steal symptoms. He is complaining of pain in the left arm especially at dialysis. He says he can "feel the motor" going in the left arm when he is at dialysis. Also says he has pain that is keeping him awake at night in the left arm.  He is extremely hard of hearing and only able to communicate well in writing. I did my best of my ability to do this, but patient is also a poor historian. No family member or anyone is present to assist him today.  He is currently dialyzing on TTS via right IJ [facility]  Physical Examination   Vitals:   11/07/20 1025  BP: (!) 151/82  Pulse: 78  Resp: 20  Temp: 98.7 F (37.1 C)  TempSrc: Temporal  SpO2: 100%  Weight: 184 lb 8 oz (83.7 kg)  Height: '6\' 2"'$  (1.88 m)   Body mass index is 23.69 kg/m.  right arm Incision is well healed, 2+ radial pulse, hand grip is 5/5, sensation in digits is  intact, palpable thrill, bruit can  be auscultated   Left AV fistula is thrombosed. He has palpable 2+ radial and brachial pulses    Medical Decision Making   John Woodard is a 50 y.o. year old male who presents s/p  right radiocephalic fistula by Dr. Trula Slade on 09/19/20. His incision is well healed and fistula is patent. He does have several branches in forearm but clinically there is adequate room for cannulation and these branches can be considered for ligation later if they become an issue with function of the fistula. Patent is without signs or symptoms of steal syndrome in the right upper extremity. He is expressing a lot of pain in  left upper arm. I will bring him back to have arterial duplex of the left upper arm just for further evaluation however he does have palpable pulses. Left upper arm AV fistula is thrombosed.   The patient's right radial access will be ready for use 12/20/20  The patient's tunneled dialysis catheter can be removed when Nephrology is comfortable with the performance of the right radiocephalic AV fistula   Karoline Caldwell, PA-C [facility] [place]: [phone]  Clinic MD: Laqueta Due

## 2020-11-13 ENCOUNTER — Other Ambulatory Visit: Payer: Self-pay

## 2020-11-13 DIAGNOSIS — N186 End stage renal disease: Secondary | ICD-10-CM

## 2020-11-13 DIAGNOSIS — Z992 Dependence on renal dialysis: Secondary | ICD-10-CM

## 2020-12-05 ENCOUNTER — Encounter: Payer: Self-pay | Admitting: Physician Assistant

## 2020-12-05 ENCOUNTER — Ambulatory Visit (INDEPENDENT_AMBULATORY_CARE_PROVIDER_SITE_OTHER): Payer: Medicare Other | Admitting: Physician Assistant

## 2020-12-05 ENCOUNTER — Other Ambulatory Visit: Payer: Self-pay

## 2020-12-05 ENCOUNTER — Ambulatory Visit (HOSPITAL_COMMUNITY)
Admission: RE | Admit: 2020-12-05 | Discharge: 2020-12-05 | Disposition: A | Discharge status: Home / Self Care | Payer: Medicare Other | Source: Ambulatory Visit | Attending: Vascular Surgery | Admitting: Vascular Surgery

## 2020-12-05 VITALS — BP 148/99 | HR 81 | Temp 98.4°F | Resp 20 | Ht 74.0 in | Wt 186.0 lb

## 2020-12-05 DIAGNOSIS — Z992 Dependence on renal dialysis: Secondary | ICD-10-CM | POA: Insufficient documentation

## 2020-12-05 DIAGNOSIS — N186 End stage renal disease: Secondary | ICD-10-CM | POA: Diagnosis not present

## 2020-12-05 NOTE — Progress Notes (Signed)
POST OPERATIVE DIALYSIS ACCESS OFFICE NOTE    CC:  F/u for dialysis access surgery  HPI:  This is a 50 y.o. male who is s/p right radiocephalic fistula by Dr. Trula Slade on 09/19/20. His incision is well healed and fistula is patent. He returns for evaluation of chronic left upper arm pain.  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  He denies pain currently. Currently dialyzing via right IJ tunneled catheter.  Dialysis days:  TTS  Dialysis center:  [facility]  Allergies  Allergen Reactions  . Naproxen Anaphylaxis and Other (See Comments)    CARDIAC ARREST- "heart stopped one time"  . Lactose Intolerance (Gi) Diarrhea and Other (See Comments)    BLOODY STOOLS  . Penicillins     UNSPECIFIED REACTION     Current Outpatient Medications  Medication Sig Dispense Refill  . acetaminophen (TYLENOL) 325 MG tablet Take 325 mg by mouth every 6 (six) hours as needed for moderate pain.     Marland Kitchen amLODipine (NORVASC) 5 MG tablet Take 10 mg by mouth at bedtime.    . cinacalcet (SENSIPAR) 30 MG tablet Take 30 mg by mouth See admin instructions. Tues, Thurs, Sat    . cloNIDine (CATAPRES) 0.3 MG tablet Take 1 tablet (0.3 mg total) by mouth 3 (three) times daily. 90 tablet 0  . docusate sodium (COLACE) 100 MG capsule Take 100 mg by mouth at bedtime.    Marland Kitchen doxazosin (CARDURA) 4 MG tablet Take 4 mg by mouth at bedtime.    Marland Kitchen doxercalciferol (HECTOROL) 4 MCG/2ML injection Doxercalciferol (Hectorol)    . esomeprazole (NEXIUM) 40 MG capsule Take 40 mg by mouth daily before breakfast.    . furosemide (LASIX) 40 MG tablet Take 40 mg by mouth daily.    . heparin 1000 unit/mL SOLN injection Heparin Sodium (Porcine) 1,000 Units/mL Systemic    . HYDROcodone-acetaminophen (NORCO) 5-325 MG tablet Take 1 tablet by mouth every 6 (six) hours as needed for moderate pain. 8 tablet 0  . losartan (COZAAR) 100 MG tablet Take 100 mg by mouth daily.    . Methoxy PEG-Epoetin Beta (MIRCERA IJ) Mircera    .  metoprolol succinate (TOPROL-XL) 50 MG 24 hr tablet Take 1 tablet (50 mg total) by mouth at bedtime. Take with or immediately following a meal. 30 tablet 0  . multivitamin (RENA-VIT) TABS tablet Take 1 tablet by mouth at bedtime.    . polyethylene glycol powder (GLYCOLAX/MIRALAX) 17 GM/SCOOP powder Take 17 g by mouth.    . sevelamer carbonate (RENVELA) 800 MG tablet Take 800-1,600 mg by mouth See admin instructions. '1600mg'$  three times daily with meals and '800mg'$  with one snack.     No current facility-administered medications for this visit.     ROS:  See HPI  BP (!) 148/99 (BP Location: Left Leg, Patient Position: Sitting, Cuff Size: Large)   Pulse 81   Temp 98.4 F (36.9 C) (Temporal)   Resp 20   Ht '6\' 2"'$  (1.88 m)   Wt 186 lb (84.4 kg)   SpO2 100%   BMI 23.88 kg/m    Physical Exam:  General appearance: Wd, WN in NAD Cardiac: RRR Respiratory: non-labored Incision:  Well healed Extremities: 5/5 bilateral hand grip strength. Hands are warm without skin breakdown. The right AVF is easily palpable with good thrill and bruit. 2+ left radial pulse.  Bil upper extremity duplex on 12/05/2020 Right: Patent right radio-cephalic AVF.  Left: No obvious stenosis seen in the left upper extremity arteries.  Assessment/Plan:   -pt does not have evidence of steal syndrome -the fistula/graft may be used starting 12/20/2020  Barbie Banner, PA-C 12/05/2020 12:14 PM [facility] [phone]  Clinic MD: Dr. Scot Dock

## 2021-01-02 ENCOUNTER — Other Ambulatory Visit: Payer: Self-pay | Admitting: Urology

## 2021-01-02 ENCOUNTER — Other Ambulatory Visit (HOSPITAL_COMMUNITY): Payer: Self-pay | Admitting: Urology

## 2021-01-02 DIAGNOSIS — N289 Disorder of kidney and ureter, unspecified: Secondary | ICD-10-CM

## 2021-01-02 DIAGNOSIS — R339 Retention of urine, unspecified: Secondary | ICD-10-CM

## 2021-01-02 DIAGNOSIS — N281 Cyst of kidney, acquired: Secondary | ICD-10-CM

## 2021-01-09 ENCOUNTER — Ambulatory Visit: Payer: Medicare Other | Admitting: Internal Medicine

## 2021-01-23 ENCOUNTER — Other Ambulatory Visit: Payer: Self-pay

## 2021-01-23 ENCOUNTER — Ambulatory Visit (HOSPITAL_COMMUNITY)
Admission: RE | Admit: 2021-01-23 | Discharge: 2021-01-23 | Disposition: A | Discharge status: Home / Self Care | Payer: Medicare Other | Source: Ambulatory Visit | Attending: Urology | Admitting: Urology

## 2021-01-23 DIAGNOSIS — N289 Disorder of kidney and ureter, unspecified: Secondary | ICD-10-CM | POA: Insufficient documentation

## 2021-01-23 DIAGNOSIS — N281 Cyst of kidney, acquired: Secondary | ICD-10-CM | POA: Insufficient documentation

## 2021-01-30 ENCOUNTER — Encounter: Payer: Self-pay | Admitting: Internal Medicine

## 2021-01-30 ENCOUNTER — Other Ambulatory Visit: Payer: Self-pay

## 2021-01-30 ENCOUNTER — Ambulatory Visit (INDEPENDENT_AMBULATORY_CARE_PROVIDER_SITE_OTHER): Payer: Medicare Other | Admitting: Internal Medicine

## 2021-01-30 VITALS — BP 140/80 | HR 97 | Temp 98.4°F | Resp 16 | Ht 74.0 in | Wt 185.0 lb

## 2021-01-30 DIAGNOSIS — I739 Peripheral vascular disease, unspecified: Secondary | ICD-10-CM | POA: Diagnosis not present

## 2021-01-30 NOTE — Progress Notes (Signed)
Claudication   Subjective:  Patient ID: John Woodard, male    DOB: [birth year]  Age: 50 y.o. MRN: [id number]  CC: Follow-up  This visit occurred during the SARS-CoV-2 public health emergency.  Safety protocols were in place, including screening questions prior to the visit, additional usage of staff PPE, and extensive cleaning of exam room while observing appropriate contact time as indicated for disinfecting solutions.    HPI John Woodard presents for f/up -  He tells me he continues to do hemodialysis every Tuesday, Thursday, and Saturday in [place].  Over the last few months he has noticed pain in his lower extremities and his feet.  He describes it as claudication.  Outpatient Medications Prior to Visit  Medication Sig Dispense Refill   acetaminophen (TYLENOL) 325 MG tablet Take 325 mg by mouth every 6 (six) hours as needed for moderate pain.      amLODipine (NORVASC) 5 MG tablet Take 10 mg by mouth at bedtime.     cinacalcet (SENSIPAR) 30 MG tablet Take 30 mg by mouth See admin instructions. Tues, Thurs, Sat     cloNIDine (CATAPRES) 0.3 MG tablet Take 1 tablet (0.3 mg total) by mouth 3 (three) times daily. 90 tablet 0   docusate sodium (COLACE) 100 MG capsule Take 100 mg by mouth at bedtime.     doxazosin (CARDURA) 4 MG tablet Take 4 mg by mouth at bedtime.     doxercalciferol (HECTOROL) 4 MCG/2ML injection Doxercalciferol (Hectorol)     esomeprazole (NEXIUM) 40 MG capsule Take 40 mg by mouth daily before breakfast.     furosemide (LASIX) 40 MG tablet Take 40 mg by mouth daily.     heparin 1000 unit/mL SOLN injection Heparin Sodium (Porcine) 1,000 Units/mL Systemic     HYDROcodone-acetaminophen (NORCO) 5-325 MG tablet Take 1 tablet by mouth every 6 (six) hours as needed for moderate pain. 8 tablet 0   losartan (COZAAR) 100 MG tablet Take 100 mg by mouth daily.     Methoxy PEG-Epoetin Beta (MIRCERA IJ) Mircera     metoprolol succinate (TOPROL-XL) 50 MG 24 hr tablet Take 1 tablet (50  mg total) by mouth at bedtime. Take with or immediately following a meal. 30 tablet 0   multivitamin (RENA-VIT) TABS tablet Take 1 tablet by mouth at bedtime.     polyethylene glycol powder (GLYCOLAX/MIRALAX) 17 GM/SCOOP powder Take 17 g by mouth.     sevelamer carbonate (RENVELA) 800 MG tablet Take 800-1,600 mg by mouth See admin instructions. '1600mg'$  three times daily with meals and '800mg'$  with one snack.     No facility-administered medications prior to visit.    ROS Review of Systems  Constitutional:  Negative for diaphoresis and fatigue.  HENT: Negative.    Eyes: Negative.   Respiratory:  Negative for cough and shortness of breath.   Cardiovascular:  Negative for chest pain, palpitations and leg swelling.  Gastrointestinal:  Negative for abdominal pain and diarrhea.  Endocrine: Negative.   Genitourinary: Negative.  Negative for difficulty urinating.  Musculoskeletal: Negative.   Skin: Negative.   Neurological: Negative.  Negative for dizziness, weakness and light-headedness.  Hematological:  Negative for adenopathy. Does not bruise/bleed easily.  Psychiatric/Behavioral: Negative.     Objective:  BP 140/80   Pulse 97   Temp 98.4 F (36.9 C) (Oral)   Resp 16   Ht '6\' 2"'$  (1.88 m)   Wt 185 lb (83.9 kg)   SpO2 99%   BMI 23.75 kg/m   BP Readings from Last  3 Encounters:  01/30/21 140/80  12/05/20 (!) 148/99  11/07/20 (!) 151/82    Wt Readings from Last 3 Encounters:  01/30/21 185 lb (83.9 kg)  12/05/20 186 lb (84.4 kg)  11/07/20 184 lb 8 oz (83.7 kg)    Physical Exam Vitals reviewed.  HENT:     Nose: Nose normal.     Mouth/Throat:     Mouth: Mucous membranes are moist.  Eyes:     Conjunctiva/sclera: Conjunctivae normal.  Cardiovascular:     Rate and Rhythm: Normal rate and regular rhythm.     Pulses:          Dorsalis pedis pulses are 0 on the right side and 0 on the left side.       Posterior tibial pulses are 0 on the right side and 0 on the left side.   Pulmonary:     Breath sounds: No stridor. No wheezing, rhonchi or rales.  Abdominal:     General: Abdomen is flat.     Palpations: There is no mass.     Tenderness: There is no abdominal tenderness. There is no guarding.  Musculoskeletal:        General: Normal range of motion.     Cervical back: Neck supple.     Right lower leg: No edema.     Left lower leg: No edema.     Right foot: Normal range of motion. No deformity.     Left foot: Normal range of motion. No deformity.  Feet:     Right foot:     Skin integrity: Skin integrity normal. No ulcer.     Toenail Condition: Right toenails are abnormally thick. Fungal disease present.    Left foot:     Skin integrity: Skin integrity normal. No ulcer.     Toenail Condition: Left toenails are abnormally thick. Fungal disease present. Lymphadenopathy:     Cervical: No cervical adenopathy.  Skin:    General: Skin is warm and dry.  Neurological:     General: No focal deficit present.     Mental Status: He is alert.    Lab Results  Component Value Date   WBC 3.6 (L) 08/30/2020   HGB 11.2 (L) 09/19/2020   HCT 33.0 (L) 09/19/2020   PLT 105 (L) 08/30/2020   GLUCOSE 78 09/19/2020   ALT 22 07/21/2019   AST 28 07/21/2019   NA 137 09/19/2020   K 5.1 09/19/2020   CL 96 (L) 09/19/2020   CREATININE 12.90 (H) 09/19/2020   BUN 46 (H) 09/19/2020   CO2 29 08/30/2020   TSH 2.80 08/24/2020   INR 0.98 04/03/2017   HGBA1C 4.3 08/24/2020    US RENAL  Result Date: 01/24/2021 CLINICAL DATA:  Renal cysts.  End-stage renal disease. EXAM: RENAL / URINARY TRACT ULTRASOUND COMPLETE COMPARISON:  January 06, 2006. FINDINGS: Right Kidney: Renal measurements: 9.2 x 5.9 x 4.8 cm = volume: 135 mL. Increased echogenicity of renal parenchyma is noted suggesting medical renal disease. Greater than 10 cysts are noted, the largest measuring 3.8 cm in the midpole. No mass or hydronephrosis visualized. Left Kidney: Renal measurements: 10.6 x 5.3 x 5.1 cm = volume:  150 mL. Increased echogenicity of renal parenchyma is noted suggesting medical renal disease. Greater than 10 cysts are noted, with the largest measuring 3.4 cm. No mass or hydronephrosis visualized. Bladder: Decompressed secondary to Foley catheter. Other: None. IMPRESSION: Bilateral renal cysts are noted, as well as increased echogenicity of renal parenchyma suggesting medical renal  disease. No definite hydronephrosis or renal obstruction is noted. Electronically Signed   By: Marijo Conception M.D.   On: 01/24/2021 09:51    Assessment & Plan:   Soham was seen today for follow-up.  Diagnoses and all orders for this visit:  Claudication of both lower extremities ([place])- I have asked him to undergo arterial flow studies to see how severe this is. -     VAS [place] ABI WITH/WO TBI; Future  I am having [name] "Tim" maintain his esomeprazole, multivitamin, sevelamer carbonate, cinacalcet, furosemide, doxazosin, acetaminophen, losartan, cloNIDine, metoprolol succinate, docusate sodium, amLODipine, polyethylene glycol powder, HYDROcodone-acetaminophen, doxercalciferol, heparin, and Methoxy PEG-Epoetin Beta (MIRCERA IJ).  No orders of the defined types were placed in this encounter.    Follow-up: Return in about 3 months (around 05/02/2021).  Scarlette Calico, MD

## 2021-01-30 NOTE — Patient Instructions (Signed)
Peripheral Vascular Disease  Peripheral vascular disease (PVD) is a disease of the blood vessels. PVD may also be called peripheral artery disease (PAD) or poor circulation. PVD is the blocking or hardening of the arteries anywhere within the circulatory system beyond the heart. This can result in a decreased supply of blood to the arms, legs, and internal organs, such as the stomach or kidneys. However, PVD most often affects a person's lower legs and feet. Without treatment, PVD oftenworsens. PVD can lead to acute limb ischemia. This occurs when an arm or leg suddenlyhas trouble getting enough blood. This is a medical emergency. What are the causes? The most common cause of PVD is atherosclerosis. This is a buildup of fatty material and other substances (plaque)inside your arteries. Pieces of plaque can break off from the walls of an artery and become stuck in a smaller artery, blocking blood flow and possiblycausing acute limb ischemia. Other common causes of PVD include: Blood clots that form inside the blood vessels. Injuries to blood vessels. Diseases that cause inflammation of blood vessels or cause blood vessel tightening (spasms). What increases the risk? The following factors may make you more likely to develop this condition: A family history of PVD. Common medical conditions, including: High cholesterol. Diabetes. High blood pressure (hypertension). Heart disease. Known atherosclerotic disease in another area of the body. Past injury, such as burns or a broken bone. Other medical conditions, such as: Buerger's disease. This is caused by inflamed blood vessels in your hands and feet. Some forms of arthritis. Birth defects that affect the arteries in your legs. Kidney disease. Using tobacco and nicotine products. Not getting enough exercise. Obesity. Being age 95 or older, or being age 45 or older and having the other risk factors. What are the signs or symptoms? This  condition may cause different symptoms. Your symptoms depend on what body part is not getting enough blood. Common signs and symptoms include: Cramps in your buttocks, legs, and feet. Intermittent claudication. This is pain and weakness in your legs during activity that resolves with rest. Leg pain at rest and leg numbness, tingling, or weakness. Coldness in a leg or foot, especially when compared to the other leg or foot. Skin or hair changes. These can include: Hair loss. Shiny skin. Pale or bluish skin. Thick toenails. Inability to get or maintain an erection (erectile dysfunction). Tiredness (fatigue). Weak pulse or no pulse in the feet. People with PVD are more likely to develop open wounds (ulcers) and sores on their toes, feet, or legs. The ulcers or sores may take longerthan normal to heal. How is this diagnosed? PVD is diagnosed based on your signs and symptoms, a physical exam, and your medical history. You may also have other tests to find the cause. Tests include: Ankle-brachial index test.This test compares the blood pressure readings of the legs and arms. This may also include an exercise ankle-brachial index test in which you walk on a treadmill to check your symptoms. Doppler ultrasound. This takes pictures of blood flow through your blood vessels. Imaging studies that use dye to show blood flow. These are: CT angiogram. Magnetic resonance angiogram, or MRA. How is this treated? Treatment for PVD depends on the cause of your condition, how severe your symptoms are, and your age. Underlying causes need to be treated and controlled. These include long-term (chronic) conditions, such as diabetes, high cholesterol, and hypertension. Treatment may include: Lifestyle changes, such as: Quitting tobacco use. Exercising regularly. Following a low-fat, low-cholesterol diet. Not  drinking alcohol. Taking medicines, such as: Blood thinners to prevent blood clots. Medicines to  improve blood flow. Medicines to improve cholesterol levels. Procedures, such as: Angioplasty. This uses an inflated balloon to open a blocked artery and improve blood flow. Stent implant. This inserts a small mesh tube to keep a blocked artery open. Peripheral bypass surgery. This reroutes blood flow around a blocked artery. Surgery to remove dead tissue from an infected wound (debridement). Amputation. This is surgical removal of the affected limb. It may be necessary in cases of acute limb ischemia when medical or surgical treatments have not helped. Follow these instructions at home: Medicines Take over-the-counter and prescription medicines only as told by your health care provider. If you are taking blood thinners: Talk with your health care provider before you take any medicines that contain aspirin or NSAIDs, such as ibuprofen. These medicines increase your risk for dangerous bleeding. Take your medicine exactly as told, at the same time every day. Avoid activities that could cause injury or bruising, and follow instructions about how to prevent falls. Wear a medical alert bracelet or carry a card that lists what medicines you take. Lifestyle     Exercise regularly. Ask your health care provider about some good activities for you. Talk with your health care provider about maintaining a healthy weight. If needed, ask about losing weight. Eat a diet that is low in fat and cholesterol. If you need help, talk with your health care provider. Do not drink alcohol. Do not use any products that contain nicotine or tobacco. These products include cigarettes, chewing tobacco, and vaping devices, such as e-cigarettes. If you need help quitting, ask your health care provider. General instructions Take good care of your feet. To do this: Wear comfortable shoes that fit well. Check your feet often for any cuts or sores. Get an annual influenza vaccine. Keep all follow-up visits. This is  important. Where to find more information Society for Vascular Surgery: vascular.org American Heart Association: heart.org National Heart, Lung, and Blood Institute: https://www.hartman-hill.biz/ Contact a health care provider if: You have leg cramps while walking. You have leg pain when you rest. Your leg or foot feels cold. Your skin changes color. You have erectile dysfunction. You have cuts or sores on your legs or feet that do not heal. Get help right away if: You have sudden changes in color and feeling of your arms or legs, such as: Your arm or leg turns cold, numb, and blue. Your arm or leg becomes red, warm, swollen, painful, or numb. You have any symptoms of a stroke. "BE FAST" is an easy way to remember the main warning signs of a stroke: B - Balance. Signs are dizziness, sudden trouble walking, or loss of balance. E - Eyes. Signs are trouble seeing or a sudden change in vision. F - Face. Signs are sudden weakness or numbness of the face, or the face or eyelid drooping on one side. A - Arms. Signs are weakness or numbness in an arm. This happens suddenly and usually on one side of the body. S - Speech. Signs are sudden trouble speaking, slurred speech, or trouble understanding what people say. T - Time. Time to call emergency services. Write down what time symptoms started. You have other signs of a stroke, such as: A sudden, severe headache with no known cause. Nausea or vomiting. Seizure. You have chest pain or trouble breathing. These symptoms may represent a serious problem that is an emergency. Do not wait to  see if the symptoms will go away. Get medical help right away. Call your local emergency services (911 in the [place] Do not drive yourself to the hospital. Summary Peripheral vascular disease (PVD) is a disease of the blood vessels. PVD is the blocking or hardening of the arteries anywhere within the circulatory system beyond the heart. PVD may cause different symptoms. Your  symptoms depend on what part of your body is not getting enough blood. Treatment for PVD depends on what caused it, how severe your symptoms are, and your age. This information is not intended to replace advice given to you by your health care provider. Make sure you discuss any questions you have with your healthcare provider. Document Revised: 01/09/2020 Document Reviewed: 01/09/2020 Elsevier Patient Education  [place].

## 2021-02-28 ENCOUNTER — Telehealth: Payer: Self-pay | Admitting: Internal Medicine

## 2021-02-28 NOTE — Telephone Encounter (Signed)
Left message for patient to call me back at [phone] to schedule Medicare Annual Wellness Visit   No hx of AWV eligible as of 07/21/09  Please schedule at anytime with [facility] Advisor if patient calls the office back.    42 Minutes appointment   Any questions, please call me at [phone]

## 2021-05-01 ENCOUNTER — Ambulatory Visit (INDEPENDENT_AMBULATORY_CARE_PROVIDER_SITE_OTHER): Payer: Medicare Other | Admitting: Internal Medicine

## 2021-05-01 ENCOUNTER — Other Ambulatory Visit: Payer: Self-pay

## 2021-05-01 ENCOUNTER — Encounter: Payer: Self-pay | Admitting: Internal Medicine

## 2021-05-01 VITALS — BP 136/78 | HR 99 | Temp 98.4°F | Resp 16 | Ht 74.0 in | Wt 187.0 lb

## 2021-05-01 DIAGNOSIS — R000 Tachycardia, unspecified: Secondary | ICD-10-CM | POA: Insufficient documentation

## 2021-05-01 DIAGNOSIS — I4891 Unspecified atrial fibrillation: Secondary | ICD-10-CM

## 2021-05-01 NOTE — Progress Notes (Signed)
Subjective:  Patient ID: John Woodard, male    DOB: 07/07/1971  Age: 50 y.o. MRN: [id number]  CC: Atrial Fibrillation  This visit occurred during the SARS-CoV-2 public health emergency.  Safety protocols were in place, including screening questions prior to the visit, additional usage of staff PPE, and extensive cleaning of exam room while observing appropriate contact time as indicated for disinfecting solutions.    HPI Dessie Roelofs presents for f/up -  Hx cannot be gathered. He continues HD three times per week.  Outpatient Medications Prior to Visit  Medication Sig Dispense Refill   acetaminophen (TYLENOL) 325 MG tablet Take 325 mg by mouth every 6 (six) hours as needed for moderate pain.      amLODipine (NORVASC) 5 MG tablet Take 10 mg by mouth at bedtime.     cinacalcet (SENSIPAR) 30 MG tablet Take 30 mg by mouth See admin instructions. Tues, Thurs, Sat     cloNIDine (CATAPRES) 0.3 MG tablet Take 1 tablet (0.3 mg total) by mouth 3 (three) times daily. 90 tablet 0   docusate sodium (COLACE) 100 MG capsule Take 100 mg by mouth at bedtime.     doxazosin (CARDURA) 4 MG tablet Take 4 mg by mouth at bedtime.     doxercalciferol (HECTOROL) 4 MCG/2ML injection Doxercalciferol (Hectorol)     esomeprazole (NEXIUM) 40 MG capsule Take 40 mg by mouth daily before breakfast.     furosemide (LASIX) 40 MG tablet Take 40 mg by mouth daily.     heparin 1000 unit/mL SOLN injection Heparin Sodium (Porcine) 1,000 Units/mL Systemic     HYDROcodone-acetaminophen (NORCO) 5-325 MG tablet Take 1 tablet by mouth every 6 (six) hours as needed for moderate pain. 8 tablet 0   losartan (COZAAR) 100 MG tablet Take 100 mg by mouth daily.     Methoxy PEG-Epoetin Beta (MIRCERA IJ) Mircera     metoprolol succinate (TOPROL-XL) 50 MG 24 hr tablet Take 1 tablet (50 mg total) by mouth at bedtime. Take with or immediately following a meal. 30 tablet 0   multivitamin (RENA-VIT) TABS tablet Take 1 tablet by mouth at  bedtime.     polyethylene glycol powder (GLYCOLAX/MIRALAX) 17 GM/SCOOP powder Take 17 g by mouth.     sevelamer carbonate (RENVELA) 800 MG tablet Take 800-1,600 mg by mouth See admin instructions. '1600mg'$  three times daily with meals and '800mg'$  with one snack.     No facility-administered medications prior to visit.    ROS Review of Systems  Unable to perform ROS: Patient nonverbal   Objective:  BP 136/78 (BP Location: Right Arm, Patient Position: Sitting, Cuff Size: Large)   Pulse 99   Temp 98.4 F (36.9 C) (Oral)   Resp 16   Ht '6\' 2"'$  (1.88 m)   Wt 187 lb (84.8 kg)   SpO2 98%   BMI 24.01 kg/m   BP Readings from Last 3 Encounters:  05/01/21 136/78  01/30/21 140/80  12/05/20 (!) 148/99    Wt Readings from Last 3 Encounters:  05/01/21 187 lb (84.8 kg)  01/30/21 185 lb (83.9 kg)  12/05/20 186 lb (84.4 kg)    Physical Exam Vitals reviewed.  HENT:     Mouth/Throat:     Mouth: Mucous membranes are moist.  Eyes:     General: No scleral icterus.    Conjunctiva/sclera: Conjunctivae normal.  Cardiovascular:     Rate and Rhythm: Tachycardia present. Rhythm irregularly irregular.     Heart sounds: No murmur heard.  Comments: A fib with RVR, 124 bpm Anterior/septal infarct pattern has worsened LAD is old Voltage loss in V6 is new Pulmonary:     Effort: Pulmonary effort is normal.     Breath sounds: No stridor. No wheezing, rhonchi or rales.  Abdominal:     General: Abdomen is flat.     Palpations: There is no mass.     Tenderness: There is no abdominal tenderness.  Musculoskeletal:        General: Normal range of motion.     Cervical back: Neck supple.     Right lower leg: No edema.     Left lower leg: No edema.  Skin:    General: Skin is warm and dry.  Neurological:     General: No focal deficit present.     Mental Status: Mental status is at baseline.  Psychiatric:        Mood and Affect: Mood normal.        Behavior: Behavior normal.    Lab Results   Component Value Date   WBC 3.6 (L) 08/30/2020   HGB 11.2 (L) 09/19/2020   HCT 33.0 (L) 09/19/2020   PLT 105 (L) 08/30/2020   GLUCOSE 78 09/19/2020   ALT 22 07/21/2019   AST 28 07/21/2019   NA 137 09/19/2020   K 5.1 09/19/2020   CL 96 (L) 09/19/2020   CREATININE 12.90 (H) 09/19/2020   BUN 46 (H) 09/19/2020   CO2 29 08/30/2020   TSH 2.80 08/24/2020   INR 0.98 04/03/2017   HGBA1C 4.3 08/24/2020    US RENAL  Result Date: 01/24/2021 CLINICAL DATA:  Renal cysts.  End-stage renal disease. EXAM: RENAL / URINARY TRACT ULTRASOUND COMPLETE COMPARISON:  January 06, 2006. FINDINGS: Right Kidney: Renal measurements: 9.2 x 5.9 x 4.8 cm = volume: 135 mL. Increased echogenicity of renal parenchyma is noted suggesting medical renal disease. Greater than 10 cysts are noted, the largest measuring 3.8 cm in the midpole. No mass or hydronephrosis visualized. Left Kidney: Renal measurements: 10.6 x 5.3 x 5.1 cm = volume: 150 mL. Increased echogenicity of renal parenchyma is noted suggesting medical renal disease. Greater than 10 cysts are noted, with the largest measuring 3.4 cm. No mass or hydronephrosis visualized. Bladder: Decompressed secondary to Foley catheter. Other: None. IMPRESSION: Bilateral renal cysts are noted, as well as increased echogenicity of renal parenchyma suggesting medical renal disease. No definite hydronephrosis or renal obstruction is noted. Electronically Signed   By: Marijo Conception M.D.   On: 01/24/2021 09:51    Assessment & Plan:   Tywaun was seen today for atrial fibrillation.  Diagnoses and all orders for this visit:  Tachycardia -     EKG 12-Lead -     TSH; Future  Atrial fibrillation with RVR ([place])- Will screen for thyroid disease and I have asked him to see cardiology as soon as possible to consider treatment options. -     Ambulatory referral to Cardiology -     TSH; Future  I am having [name] "Tim" maintain his esomeprazole, multivitamin, sevelamer carbonate,  cinacalcet, furosemide, doxazosin, acetaminophen, losartan, cloNIDine, metoprolol succinate, docusate sodium, amLODipine, polyethylene glycol powder, HYDROcodone-acetaminophen, doxercalciferol, heparin, and Methoxy PEG-Epoetin Beta (MIRCERA IJ).  No orders of the defined types were placed in this encounter.    Follow-up: Return if symptoms worsen or fail to improve.  Scarlette Calico, MD

## 2021-05-01 NOTE — Patient Instructions (Signed)
Atrial Fibrillation  Atrial fibrillation is a type of irregular or rapid heartbeat (arrhythmia). In atrial fibrillation, the top part of the heart (atria) beats in an irregular pattern. This makes the heart unable to pump bloodnormally and effectively. The goal of treatment is to prevent blood clots from forming, control your heart rate, or restore your heartbeat to a normal rhythm. If this condition is not treated, it can cause serious problems, such as a weakened heart muscle (cardiomyopathy) or a stroke. What are the causes? This condition is often caused by medical conditions that damage the heart's electrical system. These include: High blood pressure (hypertension). This is the most common cause. Certain heart problems or conditions, such as heart failure, coronary artery disease, heart valve problems, or heart surgery. Diabetes. Overactive thyroid (hyperthyroidism). Obesity. Chronic kidney disease. In some cases, the cause of this condition is not known. What increases the risk? This condition is more likely to develop in: Older people. People who smoke. Athletes who do endurance exercise. People who have a family history of atrial fibrillation. Men. People who use drugs. People who drink a lot of alcohol. People who have lung conditions, such as emphysema, pneumonia, or COPD. People who have obstructive sleep apnea. What are the signs or symptoms? Symptoms of this condition include: A feeling that your heart is racing or beating irregularly. Discomfort or pain in your chest. Shortness of breath. Sudden light-headedness or weakness. Tiring easily during exercise or activity. Fatigue. Syncope (fainting). Sweating. In some cases, there are no symptoms. How is this diagnosed? Your health care provider may detect atrial fibrillation when taking your pulse. If detected, this condition may be diagnosed with: An electrocardiogram (ECG) to check electrical signals of the  heart. An ambulatory cardiac monitor to record your heart's activity for a few days. A transthoracic echocardiogram (TTE) to create pictures of your heart. A transesophageal echocardiogram (TEE) to create even closer pictures of your heart. A stress test to check your blood supply while you exercise. Imaging tests, such as a CT scan or chest X-ray. Blood tests. How is this treated? Treatment depends on underlying conditions and how you feel when you experience atrial fibrillation. This condition may be treated with: Medicines to prevent blood clots or to treat heart rate or heart rhythm problems. Electrical cardioversion to reset the heart's rhythm. A pacemaker to correct abnormal heart rhythm. Ablation to remove the heart tissue that sends abnormal signals. Left atrial appendage closure to seal the area where blood clots can form. In some cases, underlying conditions will be treated. Follow these instructions at home: Medicines Take over-the counter and prescription medicines only as told by your health care provider. Do not take any new medicines without talking to your health care provider. If you are taking blood thinners: Talk with your health care provider before you take any medicines that contain aspirin or NSAIDs, such as ibuprofen. These medicines increase your risk for dangerous bleeding. Take your medicine exactly as told, at the same time every day. Avoid activities that could cause injury or bruising, and follow instructions about how to prevent falls. Wear a medical alert bracelet or carry a card that lists what medicines you take. Lifestyle     Do not use any products that contain nicotine or tobacco, such as cigarettes, e-cigarettes, and chewing tobacco. If you need help quitting, ask your health care provider. Eat heart-healthy foods. Talk with a dietitian to make an eating plan that is right for you. Exercise regularly as told by   your health care provider. Do not  drink alcohol. Lose weight if you are overweight. Do not use drugs, including cannabis. General instructions If you have obstructive sleep apnea, manage your condition as told by your health care provider. Do not use diet pills unless your health care provider approves. Diet pills can make heart problems worse. Keep all follow-up visits as told by your health care provider. This is important. Contact a health care provider if you: Notice a change in the rate, rhythm, or strength of your heartbeat. Are taking a blood thinner and you notice more bruising. Tire more easily when you exercise or do heavy work. Have a sudden change in weight. Get help right away if you have:  Chest pain, abdominal pain, sweating, or weakness. Trouble breathing. Side effects of blood thinners, such as blood in your vomit, stool, or urine, or bleeding that cannot stop. Any symptoms of a stroke. "BE FAST" is an easy way to remember the main warning signs of a stroke: B - Balance. Signs are dizziness, sudden trouble walking, or loss of balance. E - Eyes. Signs are trouble seeing or a sudden change in vision. F - Face. Signs are sudden weakness or numbness of the face, or the face or eyelid drooping on one side. A - Arms. Signs are weakness or numbness in an arm. This happens suddenly and usually on one side of the body. S - Speech. Signs are sudden trouble speaking, slurred speech, or trouble understanding what people say. T - Time. Time to call emergency services. Write down what time symptoms started. Other signs of a stroke, such as: A sudden, severe headache with no known cause. Nausea or vomiting. Seizure. These symptoms may represent a serious problem that is an emergency. Do not wait to see if the symptoms will go away. Get medical help right away. Call your local emergency services (911 in the [place] Do not drive yourself to the hospital. Summary Atrial fibrillation is a type of irregular or rapid  heartbeat (arrhythmia). Symptoms include a feeling that your heart is beating fast or irregularly. You may be given medicines to prevent blood clots or to treat heart rate or heart rhythm problems. Get help right away if you have signs or symptoms of a stroke. Get help right away if you cannot catch your breath or have chest pain or pressure. This information is not intended to replace advice given to you by your health care provider. Make sure you discuss any questions you have with your healthcare provider. Document Revised: 12/29/2018 Document Reviewed: 12/29/2018 Elsevier Patient Education  2022 [employer] Inc.  

## 2021-05-02 ENCOUNTER — Ambulatory Visit: Payer: Medicare Other | Admitting: Internal Medicine

## 2021-10-19 ENCOUNTER — Other Ambulatory Visit: Payer: Self-pay

## 2021-10-19 ENCOUNTER — Encounter (HOSPITAL_COMMUNITY): Payer: Self-pay | Admitting: Emergency Medicine

## 2021-10-19 ENCOUNTER — Inpatient Hospital Stay (HOSPITAL_COMMUNITY)
Admission: EM | Admit: 2021-10-19 | Discharge: 2021-10-23 | DRG: 808 | Disposition: A | Discharge status: Home / Self Care | Payer: Medicare Other | Attending: Internal Medicine | Admitting: Internal Medicine

## 2021-10-19 DIAGNOSIS — Z992 Dependence on renal dialysis: Secondary | ICD-10-CM

## 2021-10-19 DIAGNOSIS — N186 End stage renal disease: Secondary | ICD-10-CM

## 2021-10-19 DIAGNOSIS — Z888 Allergy status to other drugs, medicaments and biological substances status: Secondary | ICD-10-CM

## 2021-10-19 DIAGNOSIS — E0822 Diabetes mellitus due to underlying condition with diabetic chronic kidney disease: Secondary | ICD-10-CM

## 2021-10-19 DIAGNOSIS — Z91011 Allergy to milk products: Secondary | ICD-10-CM

## 2021-10-19 DIAGNOSIS — Z87440 Personal history of urinary (tract) infections: Secondary | ICD-10-CM

## 2021-10-19 DIAGNOSIS — K219 Gastro-esophageal reflux disease without esophagitis: Secondary | ICD-10-CM | POA: Diagnosis present

## 2021-10-19 DIAGNOSIS — N319 Neuromuscular dysfunction of bladder, unspecified: Secondary | ICD-10-CM | POA: Diagnosis present

## 2021-10-19 DIAGNOSIS — Z833 Family history of diabetes mellitus: Secondary | ICD-10-CM

## 2021-10-19 DIAGNOSIS — Z8249 Family history of ischemic heart disease and other diseases of the circulatory system: Secondary | ICD-10-CM

## 2021-10-19 DIAGNOSIS — Z880 Allergy status to penicillin: Secondary | ICD-10-CM

## 2021-10-19 DIAGNOSIS — N2581 Secondary hyperparathyroidism of renal origin: Secondary | ICD-10-CM | POA: Diagnosis present

## 2021-10-19 DIAGNOSIS — M898X9 Other specified disorders of bone, unspecified site: Secondary | ICD-10-CM | POA: Diagnosis present

## 2021-10-19 DIAGNOSIS — I120 Hypertensive chronic kidney disease with stage 5 chronic kidney disease or end stage renal disease: Secondary | ICD-10-CM | POA: Diagnosis present

## 2021-10-19 DIAGNOSIS — H9190 Unspecified hearing loss, unspecified ear: Secondary | ICD-10-CM | POA: Diagnosis present

## 2021-10-19 DIAGNOSIS — I4891 Unspecified atrial fibrillation: Secondary | ICD-10-CM

## 2021-10-19 DIAGNOSIS — D61818 Other pancytopenia: Principal | ICD-10-CM | POA: Diagnosis present

## 2021-10-19 DIAGNOSIS — K317 Polyp of stomach and duodenum: Secondary | ICD-10-CM

## 2021-10-19 DIAGNOSIS — F79 Unspecified intellectual disabilities: Secondary | ICD-10-CM

## 2021-10-19 DIAGNOSIS — D696 Thrombocytopenia, unspecified: Secondary | ICD-10-CM | POA: Diagnosis present

## 2021-10-19 DIAGNOSIS — Z79899 Other long term (current) drug therapy: Secondary | ICD-10-CM

## 2021-10-19 DIAGNOSIS — Z20822 Contact with and (suspected) exposure to covid-19: Secondary | ICD-10-CM | POA: Diagnosis present

## 2021-10-19 DIAGNOSIS — E1122 Type 2 diabetes mellitus with diabetic chronic kidney disease: Secondary | ICD-10-CM | POA: Diagnosis present

## 2021-10-19 DIAGNOSIS — I480 Paroxysmal atrial fibrillation: Secondary | ICD-10-CM

## 2021-10-19 DIAGNOSIS — Z794 Long term (current) use of insulin: Secondary | ICD-10-CM

## 2021-10-19 DIAGNOSIS — I10 Essential (primary) hypertension: Secondary | ICD-10-CM | POA: Diagnosis present

## 2021-10-19 DIAGNOSIS — D649 Anemia, unspecified: Secondary | ICD-10-CM | POA: Diagnosis not present

## 2021-10-19 HISTORY — DX: Paroxysmal atrial fibrillation: I48.0

## 2021-10-19 LAB — COMPREHENSIVE METABOLIC PANEL
ALT: 16 U/L (ref 0–44)
AST: 20 U/L (ref 15–41)
Albumin: 3.8 g/dL (ref 3.5–5.0)
Alkaline Phosphatase: 62 U/L (ref 38–126)
Anion gap: 14 (ref 5–15)
BUN: 51 mg/dL — ABNORMAL HIGH (ref 6–20)
CO2: 30 mmol/L (ref 22–32)
Calcium: 9.8 mg/dL (ref 8.9–10.3)
Chloride: 94 mmol/L — ABNORMAL LOW (ref 98–111)
Creatinine, Ser: 12.02 mg/dL — ABNORMAL HIGH (ref 0.61–1.24)
GFR, Estimated: 5 mL/min — ABNORMAL LOW (ref >60)
Glucose, Bld: 96 mg/dL (ref 70–99)
Potassium: 4.7 mmol/L (ref 3.5–5.1)
Sodium: 138 mmol/L (ref 135–145)
Total Bilirubin: 0.1 mg/dL — ABNORMAL LOW (ref 0.3–1.2)
Total Protein: 6.8 g/dL (ref 6.5–8.1)

## 2021-10-19 LAB — CBC
HCT: 19.4 % — ABNORMAL LOW (ref 39.0–52.0)
HCT: 20.1 % — ABNORMAL LOW (ref 39.0–52.0)
Hemoglobin: 6.2 g/dL — CL (ref 13.0–17.0)
Hemoglobin: 6.6 g/dL — CL (ref 13.0–17.0)
MCH: 27.4 pg (ref 26.0–34.0)
MCH: 27.7 pg (ref 26.0–34.0)
MCHC: 32 g/dL (ref 30.0–36.0)
MCHC: 32.8 g/dL (ref 30.0–36.0)
MCV: 85.8 fL (ref 80.0–100.0)
MCV: 84.5 fL (ref 80.0–100.0)
Platelets: 89 10*3/uL — ABNORMAL LOW (ref 150–400)
Platelets: 84 10*3/uL — ABNORMAL LOW (ref 150–400)
RBC: 2.26 MIL/uL — ABNORMAL LOW (ref 4.22–5.81)
RBC: 2.38 MIL/uL — ABNORMAL LOW (ref 4.22–5.81)
RDW: 14.6 % (ref 11.5–15.5)
RDW: 14.5 % (ref 11.5–15.5)
WBC: 3.7 10*3/uL — ABNORMAL LOW (ref 4.0–10.5)
WBC: 4.1 10*3/uL (ref 4.0–10.5)
nRBC: 0 % (ref 0.0–0.2)
nRBC: 0 % (ref 0.0–0.2)

## 2021-10-19 LAB — GLUCOSE, CAPILLARY: Glucose-Capillary: 106 mg/dL — ABNORMAL HIGH (ref 70–99)

## 2021-10-19 LAB — PREPARE RBC (CROSSMATCH)

## 2021-10-19 LAB — MAGNESIUM: Magnesium: 2.6 mg/dL — ABNORMAL HIGH (ref 1.7–2.4)

## 2021-10-19 LAB — HEPATITIS B SURFACE ANTIGEN: Hepatitis B Surface Ag: NONREACTIVE

## 2021-10-19 LAB — ABO/RH: ABO/RH(D): O POS

## 2021-10-19 LAB — TSH: TSH: 2.291 u[IU]/mL (ref 0.350–4.500)

## 2021-10-19 LAB — RESP PANEL BY RT-PCR (FLU A&B, COVID) ARPGX2
Influenza A by PCR: NEGATIVE
Influenza B by PCR: NEGATIVE
SARS Coronavirus 2 by RT PCR: NEGATIVE

## 2021-10-19 LAB — POC OCCULT BLOOD, ED: Fecal Occult Bld: NEGATIVE

## 2021-10-19 LAB — CBG MONITORING, ED: Glucose-Capillary: 73 mg/dL (ref 70–99)

## 2021-10-19 LAB — HIV ANTIBODY (ROUTINE TESTING W REFLEX): HIV Screen 4th Generation wRfx: NONREACTIVE

## 2021-10-19 MED ORDER — SODIUM CHLORIDE 0.9 % IV SOLN: 250.0000 mL | INTRAVENOUS | Status: DC | PRN

## 2021-10-19 MED ORDER — SODIUM CHLORIDE 0.9% FLUSH
3.0000 mL | Freq: Two times a day (BID) | INTRAVENOUS | Status: DC
  Administered 2021-10-19 – 2021-10-23 (×6): 3 mL via INTRAVENOUS

## 2021-10-19 MED ORDER — CINACALCET HCL 30 MG PO TABS
30.0000 mg | ORAL_TABLET | ORAL | Status: DC
  Administered 2021-10-21 – 2021-10-23 (×2): 30 mg via ORAL
  Filled 2021-10-19 (×2): qty 1

## 2021-10-19 MED ORDER — DARBEPOETIN ALFA 100 MCG/0.5ML IJ SOSY
100.0000 ug | PREFILLED_SYRINGE | INTRAMUSCULAR | Status: DC
  Filled 2021-10-19: qty 0.5

## 2021-10-19 MED ORDER — CHLORHEXIDINE GLUCONATE CLOTH 2 % EX PADS
6.0000 | MEDICATED_PAD | Freq: Every day | CUTANEOUS | Status: DC
  Administered 2021-10-20 – 2021-10-23 (×4): 6 via TOPICAL

## 2021-10-19 MED ORDER — LOSARTAN POTASSIUM 50 MG PO TABS
100.0000 mg | ORAL_TABLET | Freq: Every day | ORAL | Status: DC
  Administered 2021-10-20 – 2021-10-23 (×3): 100 mg via ORAL
  Filled 2021-10-19 (×3): qty 2

## 2021-10-19 MED ORDER — DOXERCALCIFEROL 4 MCG/2ML IV SOLN
10.0000 ug | INTRAVENOUS | Status: DC
  Administered 2021-10-22: 10 ug via INTRAVENOUS
  Filled 2021-10-19 (×2): qty 6

## 2021-10-19 MED ORDER — SEVELAMER CARBONATE 800 MG PO TABS
1600.0000 mg | ORAL_TABLET | Freq: Three times a day (TID) | ORAL | Status: DC
  Administered 2021-10-20 – 2021-10-23 (×3): 1600 mg via ORAL
  Filled 2021-10-19 (×4): qty 2

## 2021-10-19 MED ORDER — AMLODIPINE BESYLATE 10 MG PO TABS
10.0000 mg | ORAL_TABLET | Freq: Every day | ORAL | Status: DC
Start: 2021-10-19 — End: 2021-10-19
  Filled 2021-10-19: qty 1

## 2021-10-19 MED ORDER — ACETAMINOPHEN 650 MG RE SUPP: 650.0000 mg | Freq: Four times a day (QID) | RECTAL | Status: DC | PRN

## 2021-10-19 MED ORDER — DOXERCALCIFEROL 4 MCG/2ML IV SOLN
10.0000 ug | INTRAVENOUS | Status: AC
  Administered 2021-10-20: 10 ug via INTRAVENOUS
  Filled 2021-10-19: qty 6

## 2021-10-19 MED ORDER — RENA-VITE PO TABS
1.0000 | ORAL_TABLET | Freq: Every day | ORAL | Status: DC
  Administered 2021-10-19 – 2021-10-22 (×4): 1 via ORAL
  Filled 2021-10-19 (×4): qty 1

## 2021-10-19 MED ORDER — SEVELAMER CARBONATE 800 MG PO TABS
800.0000 mg | ORAL_TABLET | ORAL | Status: DC
  Administered 2021-10-20: 800 mg via ORAL
  Filled 2021-10-19: qty 1

## 2021-10-19 MED ORDER — SODIUM CHLORIDE 0.9% FLUSH: 3.0000 mL | INTRAVENOUS | Status: DC | PRN

## 2021-10-19 MED ORDER — FUROSEMIDE 40 MG PO TABS
40.0000 mg | ORAL_TABLET | Freq: Every day | ORAL | Status: DC
  Administered 2021-10-20 – 2021-10-23 (×3): 40 mg via ORAL
  Filled 2021-10-19 (×3): qty 1

## 2021-10-19 MED ORDER — ACETAMINOPHEN 325 MG PO TABS: 650.0000 mg | ORAL_TABLET | Freq: Four times a day (QID) | ORAL | Status: DC | PRN

## 2021-10-19 MED ORDER — METOPROLOL SUCCINATE ER 50 MG PO TB24
50.0000 mg | ORAL_TABLET | Freq: Every day | ORAL | Status: DC
  Administered 2021-10-19 – 2021-10-22 (×4): 50 mg via ORAL
  Filled 2021-10-19 (×4): qty 1

## 2021-10-19 MED ORDER — INSULIN ASPART 100 UNIT/ML IJ SOLN: 0.0000 [IU] | Freq: Three times a day (TID) | INTRAMUSCULAR | Status: DC

## 2021-10-19 MED ORDER — SODIUM CHLORIDE 0.9 % IV SOLN
10.0000 mL/h | Freq: Once | INTRAVENOUS | Status: AC
  Administered 2021-10-19: 10 mL/h via INTRAVENOUS

## 2021-10-19 MED ORDER — PANTOPRAZOLE SODIUM 40 MG PO TBEC
80.0000 mg | DELAYED_RELEASE_TABLET | Freq: Every day | ORAL | Status: DC
  Administered 2021-10-20 – 2021-10-21 (×2): 80 mg via ORAL
  Filled 2021-10-19 (×2): qty 2

## 2021-10-19 MED ORDER — CLONIDINE HCL 0.2 MG PO TABS
0.3000 mg | ORAL_TABLET | Freq: Three times a day (TID) | ORAL | Status: DC
  Administered 2021-10-19 – 2021-10-23 (×10): 0.3 mg via ORAL
  Filled 2021-10-19 (×10): qty 1

## 2021-10-19 NOTE — H&P (Addendum)
?History and Physical  ? ? ?Patient: John Woodard [id number] DOB: 04-23-1971 ?DOA: 10/19/2021 ?DOS: the patient was seen and examined on 10/19/2021 ?PCP: Janith Lima, MD  ?Patient coming from:  dialysis  - lives with his brother  ? ? ?Chief Complaint: anemia  ? ?HPI: John Woodard is a 51 y.o. male with medical history significant of ESRD on dialysis TTS-last session Thursday, HTN, T2DM, GERD, afib, pancytopenia, intellectual disability, neurogenic bladder at baseline who presented to ED after labs showed his hgb to be low. He states he has been short of breath and fatigued for 16 years, but seems to be more fatigued and short of breath with exertion recently. Can not really tell me about his stools when asked if dark or bloody. History is difficult due to intellectual disability. Denies any fever/chills or recent illness. No recent N/V. Rest of ROS can not be obtained.  ? ?ER Course:  vitals: bp: 153/98, HR: 79, RR: 14, oxygen: 98%RA ?Pertinent labs: hgb: 6.2, platelets 89, fecal occult negative, BUN: 51, creatinine: 12.02,  ?In ED: 1 unit of blood ordered. Nephrology consulted.  ? ? ?Review of Systems: can not fully be obtained by patient due to baseline intellectual disability.  ?Past Medical History:  ?Diagnosis Date  ? Anemia   ? Atrial fibrillation ([place])   ? Constipation   ? Dialysis patient [facility])   ? Dyspnea   ? Dysrhythmia   ? a-fib  ? ESRD on dialysis [facility])   ? Hemo [place], Th , Sat  ? GERD (gastroesophageal reflux disease)   ? Hard of hearing   ? Hypertension   ? Poor historian   ? Renal disorder   ? ?Past Surgical History:  ?Procedure Laterality Date  ? A/V SHUNTOGRAM Left 04/03/2017  ? Procedure: A/V Shuntogram;  Surgeon: Elam Dutch, MD;  Location: [place] [facility];  Service: Cardiovascular;  Laterality: Left;  ? AV FISTULA PLACEMENT Right 09/19/2020  ? Procedure: RIGHT UPPER EXTREMITY FIRST STAGE BRACHIOCEPHALIC ARTERIOVENOUS (AV) FISTULA CREATION;  Surgeon: Serafina Mitchell, MD;   Location: [place];  Service: Vascular;  Laterality: Right;  ? AVF Left   ? BASCILIC VEIN TRANSPOSITION Left 04/09/2018  ? Procedure: FIRST STAGE BACILIC VEIN TRANSPOSITION LEFT ARM;  Surgeon: Serafina Mitchell, MD;  Location: [place];  Service: Vascular;  Laterality: Left;  ? COLONOSCOPY  2018  ? FISTULA SUPERFICIALIZATION Left 06/09/2018  ? Procedure: FISTULA SUPERFICIALIZATION LEFT UPPER ARM;  Surgeon: Serafina Mitchell, MD;  Location: [place];  Service: Vascular;  Laterality: Left;  ? FISTULOGRAM Left 07/16/2017  ? Procedure: FISTULOGRAM LEFT ARM ARTERIOVENOUS FISTULA;  Surgeon: Serafina Mitchell, MD;  Location: [facility];  Service: Vascular;  Laterality: Left;  ? FLEXIBLE SIGMOIDOSCOPY  10/2016  ? PERIPHERAL VASCULAR BALLOON ANGIOPLASTY Left 04/03/2017  ? Procedure: PERIPHERAL VASCULAR BALLOON ANGIOPLASTY;  Surgeon: Elam Dutch, MD;  Location: [place] [facility];  Service: Cardiovascular;  Laterality: Left;  Arm AV fistula  ? REVISON OF ARTERIOVENOUS FISTULA Left 07/16/2017  ? Procedure: PLICATION  OF ARTERIOVENOUS FISTULA  LEFT ARM;  Surgeon: Serafina Mitchell, MD;  Location: [facility] OR;  Service: Vascular;  Laterality: Left;  ? VASCULAR SURGERY    ? ?Social History:  reports that he has never smoked. He has never used smokeless tobacco. He reports that he does not drink alcohol and does not use drugs. ? ?Allergies  ?Allergen Reactions  ? Naproxen Anaphylaxis and Other (See Comments)  ?  CARDIAC ARREST- "heart stopped  one time"  ? Lactose Intolerance (Gi) Diarrhea and Other (See Comments)  ?  BLOODY STOOLS  ? Penicillins Other (See Comments)  ?  UNSPECIFIED REACTION   ? ? ?Family History  ?Problem Relation Age of Onset  ? Hypertension Mother   ? Diabetes Mellitus II Sister   ? ? ?Prior to Admission medications   ?Medication Sig Start Date End Date Taking? Authorizing Provider  ?acetaminophen (TYLENOL) 325 MG tablet Take 325 mg by mouth every 6 (six) hours as needed for moderate pain.     [provider]  ?amLODipine  (NORVASC) 5 MG tablet Take 10 mg by mouth at bedtime. 07/06/20   [provider]  ?cinacalcet (SENSIPAR) 30 MG tablet Take 30 mg by mouth See admin instructions. Tues, Thurs, Sat    [provider]  ?cloNIDine (CATAPRES) 0.3 MG tablet Take 1 tablet (0.3 mg total) by mouth 3 (three) times daily. 04/07/17   Aline August, MD  ?docusate sodium (COLACE) 100 MG capsule Take 100 mg by mouth at bedtime.    [provider]  ?doxazosin (CARDURA) 4 MG tablet Take 4 mg by mouth at bedtime.    [provider]  ?esomeprazole (NEXIUM) 40 MG capsule Take 40 mg by mouth daily before breakfast.    [provider]  ?furosemide (LASIX) 40 MG tablet Take 40 mg by mouth daily.    [provider]  ?heparin 1000 unit/mL SOLN injection Heparin Sodium (Porcine) 1,000 Units/mL Systemic 12/01/20 11/30/21  [provider]  ?HYDROcodone-acetaminophen (NORCO) 5-325 MG tablet Take 1 tablet by mouth every 6 (six) hours as needed for moderate pain. 09/19/20   Rhyne, Hulen Shouts, PA-C  ?losartan (COZAAR) 100 MG tablet Take 100 mg by mouth daily.    [provider]  ?Methoxy PEG-Epoetin Beta (MIRCERA IJ) Mircera 10/25/20 10/24/21  [provider]  ?metoprolol succinate (TOPROL-XL) 50 MG 24 hr tablet Take 1 tablet (50 mg total) by mouth at bedtime. Take with or immediately following a meal. 04/07/17   Aline August, MD  ?multivitamin (RENA-VIT) TABS tablet Take 1 tablet by mouth at bedtime.    [provider]  ?polyethylene glycol powder (GLYCOLAX/MIRALAX) 17 GM/SCOOP powder Take 17 g by mouth.    [provider]  ?sevelamer carbonate (RENVELA) 800 MG tablet Take 800-1,600 mg by mouth See admin instructions. 1600mg  three times daily with meals and 800mg  with one snack.    [provider]  ? ? ?Physical Exam: ?Vitals:  ? 10/19/21 1137 10/19/21 1452 10/19/21 1510 10/19/21 1817  ?BP: (!) 153/98 (!) 149/88 (!) 143/85 (!) 137/99  ?Pulse: 79 66 68 76  ?Resp:  14 10 16 18   ?Temp:  97.6 ?F (36.4 ?C) 97.6 ?F (36.4 ?C) 98.3 ?F (36.8 ?C)  ?TempSrc: Oral Oral Oral Oral  ?SpO2: 98% 94% 97% 98%  ? ?General:  Appears calm and comfortable and is in NAD ?Eyes:  PERRL, EOMI, normal lids, iris ?ENT:  hard of hearing, lips & tongue, mmm; appropriate dentition ?Neck:  no LAD, masses or thyromegaly; no carotid bruits ?Cardiovascular:  irregularly, irregular, no m/r/g. trace LE edema.  ?Respiratory:   CTA bilaterally with no wheezes/rales/rhonchi.  Normal respiratory effort. ?Abdomen:  soft, NT, ND, NABS ?Back:   normal alignment, no CVAT ?Skin:  no rash or induration seen on limited exam. Suprapubic catheter. Fistula right wrist and left upper arm  ?Musculoskeletal:  grossly normal tone BUE/BLE, good ROM, no bony abnormality ?Lower extremity:  No LE edema.  Limited foot exam  with no ulcerations.  2+ distal pulses. ?Psychiatric:  grossly normal mood and affect, speech fluent and appropriate, AOx3 ?Neurologic:  CN 2-12 grossly intact, moves all extremities in coordinated fashion, sensation intact ? ? ?Radiological Exams on Admission: ?Independently reviewed - see discussion in A/P where applicable ? ?No results found. ? ?EKG: Independently reviewed.  States afib, I see a few p waves with rate 74 ; nonspecific ST changes with no evidence of acute ischemia ? ? ?Labs on Admission: I have personally reviewed the available labs and imaging studies at the time of the admission. ? ?Pertinent labs:   ?hgb: 6.2,  ?platelets 89, ?fecal occult negative,  ?BUN: 51,  ?creatinine: 12.02,  ? ? ?Assessment and Plan: ?Acute on chronic symptomatic anemia  ?51 year old presenting with acute on chronic anemia with associated shortness of breath and fatigue ?-obs to telemetry  ?-fecal occult negative with no history of N/V/abdominal pain or black/tarry or bloody stools. Has a +FOBT done at dialysis on 10/05/21.  ?-transfuse 1 unit RBC, coordinate with dialysis if needs more ?-H&H post transfusion ?-will repeat  FOBT ?-consult GI if positive or hgb trends downward, but no evidence of active bleed at time of admission  ?-hx of acute on chronic anemia hospitalization in 2018. EGD and colonoscopy done at that time.

## 2021-10-19 NOTE — Assessment & Plan Note (Addendum)
--  Has had suprapubic catheter since 2003 ?--Catheter care  ?--follow-up as an outpatient ?

## 2021-10-19 NOTE — ED Notes (Signed)
Pt brother, [name], stated pt understand better when you write things out. ? ?[name] states pt usually moves around at home fine. ?

## 2021-10-19 NOTE — Progress Notes (Signed)
Pt hemoglobin was called in low by lab, MD notified. Advised that a transfusion will be done in Hemodialysis per nephrology advise. ?

## 2021-10-19 NOTE — ED Provider Notes (Signed)
?[facility] ?Provider Note ? ? ?CSN: [id number] ?Arrival date & time: 10/19/21  1128 ? ?  ? ?History ?ESRD ?Chief Complaint  ?Patient presents with  ? GI Bleeding  ? ? ?John Woodard is a 51 y.o. male. ? ?51 y.o male with a PMH of MR, ESRD on Tuesday, Thursday, Saturday presents to the ED via EMS from dialysis center after a hemoglobin of 6.5 was reported.  Patient did endorse some abdominal pain, however he is able to provide further for patient about how low the abdominal pain has been ongoing for.  Into records, it is noted that he is previously had episodes of GI bleeds. Level 5 caveat mental status.  ? ?The history is provided by the patient.  ? ?  ? ?Home Medications ?Prior to Admission medications   ?Medication Sig Start Date End Date Taking? Authorizing Provider  ?acetaminophen (TYLENOL) 325 MG tablet Take 325 mg by mouth every 6 (six) hours as needed for moderate pain.     [provider]  ?amLODipine (NORVASC) 5 MG tablet Take 10 mg by mouth at bedtime. 07/06/20   [provider]  ?cinacalcet (SENSIPAR) 30 MG tablet Take 30 mg by mouth See admin instructions. Tues, Thurs, Sat    [provider]  ?cloNIDine (CATAPRES) 0.3 MG tablet Take 1 tablet (0.3 mg total) by mouth 3 (three) times daily. 04/07/17   Aline August, MD  ?docusate sodium (COLACE) 100 MG capsule Take 100 mg by mouth at bedtime.    [provider]  ?doxazosin (CARDURA) 4 MG tablet Take 4 mg by mouth at bedtime.    [provider]  ?esomeprazole (NEXIUM) 40 MG capsule Take 40 mg by mouth daily before breakfast.    [provider]  ?furosemide (LASIX) 40 MG tablet Take 40 mg by mouth daily.    [provider]  ?heparin 1000 unit/mL SOLN injection Heparin Sodium (Porcine) 1,000 Units/mL Systemic 12/01/20 11/30/21  [provider]  ?HYDROcodone-acetaminophen (NORCO) 5-325 MG tablet Take 1 tablet by mouth every 6 (six) hours as needed for  moderate pain. 09/19/20   Rhyne, Hulen Shouts, PA-C  ?losartan (COZAAR) 100 MG tablet Take 100 mg by mouth daily.    [provider]  ?Methoxy PEG-Epoetin Beta (MIRCERA IJ) Mircera 10/25/20 10/24/21  [provider]  ?metoprolol succinate (TOPROL-XL) 50 MG 24 hr tablet Take 1 tablet (50 mg total) by mouth at bedtime. Take with or immediately following a meal. 04/07/17   Aline August, MD  ?multivitamin (RENA-VIT) TABS tablet Take 1 tablet by mouth at bedtime.    [provider]  ?polyethylene glycol powder (GLYCOLAX/MIRALAX) 17 GM/SCOOP powder Take 17 g by mouth.    [provider]  ?sevelamer carbonate (RENVELA) 800 MG tablet Take 800-1,600 mg by mouth See admin instructions. 1600mg  three times daily with meals and 800mg  with one snack.    [provider]  ?   ? ?Allergies    ?Naproxen, Lactose intolerance (gi), and Penicillins   ? ?Review of Systems   ?Review of Systems  ?HENT:  Negative for sinus pressure.   ?Respiratory:  Negative for shortness of breath.   ?Cardiovascular:  Negative for chest pain.  ?Gastrointestinal:  Positive for abdominal pain and blood in stool. Negative for nausea and vomiting.  ?All other systems reviewed and are negative. ? ?Physical Exam ?Updated Vital Signs ?BP (!) 153/98 (BP Location: Left Arm)   Pulse 79   Resp 14   SpO2 98%  ?  Physical Exam ?Vitals and nursing note reviewed. Exam conducted with a chaperone present.  ?Constitutional:   ?   Appearance: Normal appearance.  ?HENT:  ?   Head: Normocephalic.  ?Eyes:  ?   Pupils: Pupils are equal, round, and reactive to light.  ?Cardiovascular:  ?   Rate and Rhythm: Normal rate.  ?   Pulses: Normal pulses.  ?Pulmonary:  ?   Effort: Pulmonary effort is normal.  ?   Breath sounds: No wheezing.  ?Abdominal:  ?   General: Abdomen is flat.  ?   Palpations: Abdomen is soft.  ?   Tenderness: There is no abdominal tenderness.  ?Genitourinary: ?   Comments: Grossly negative on my exam. No hemorrhoids noted.   ?Musculoskeletal:  ?   Cervical back: Normal range of motion and neck supple.  ?Skin: ?   General: Skin is warm and dry.  ?Neurological:  ?   Mental Status: Mental status is at baseline.  ? ? ?ED Results / Procedures / Treatments   ?Labs ?(all labs ordered are listed, but only abnormal results are displayed) ?Labs Reviewed  ?COMPREHENSIVE METABOLIC PANEL - Abnormal; Notable for the following components:  ?    Result Value  ? Chloride 94 (*)   ? BUN 51 (*)   ? Creatinine, Ser 12.02 (*)   ? Total Bilirubin 0.1 (*)   ? GFR, Estimated 5 (*)   ? All other components within normal limits  ?CBC - Abnormal; Notable for the following components:  ? WBC 3.7 (*)   ? RBC 2.26 (*)   ? Hemoglobin 6.2 (*)   ? HCT 19.4 (*)   ? Platelets 89 (*)   ? All other components within normal limits  ?RESP PANEL BY RT-PCR (FLU A&B, COVID) ARPGX2  ?URINALYSIS, ROUTINE W REFLEX MICROSCOPIC  ?POC OCCULT BLOOD, ED  ?TYPE AND SCREEN  ?ABO/RH  ?PREPARE RBC (CROSSMATCH)  ? ? ?EKG ?None ? ?Radiology ?No results found. ? ?Procedures ?Procedures  ? ? ?Medications Ordered in ED ?Medications  ?0.9 %  sodium chloride infusion (10 mL/hr Intravenous New Bag/Given 10/19/21 1317)  ? ? ?ED Course/ Medical Decision Making/ A&P ?  ?                        ?Medical Decision Making ?Amount and/or Complexity of Data Reviewed ?Labs: ordered. ? ?Risk ?Prescription drug management. ? ?This patient presents to the ED for concern of low hemoglobin, this involves a number of treatment options, and is a complaint that carries with it a high risk of complications and morbidity.  The differential diagnosis includes symptomatic anemia, GI bleed.  ? ? ?Co morbidities: ?Discussed in HPI ? ? ?Brief History: ? ?Patient with underlying history of MR, ESRD presents to the ED with low hemoglobin.  Sent in from dialysis center this morning where he did not receive treatment this morning and was noted to have more increase in his fatigue.  Collateral information and obtained from his  brother Zach Tietje. ? ?EMR reviewed including pt PMHx, past surgical history and past visits to ER.  ? ?See HPI for more details ? ? ?Lab Tests: ? ?I ordered and independently interpreted labs.  The pertinent results include:   ? ?Labs notable for CMP with slight decrease in his chloride, creatinine levels 12.02 at his baseline according to previous however did not have dialysis this morning LFTs are within normal limits.  CBC with some leukopenia.  Hemoglobin is 6.2 on  today's visit.  Rectal exam performed by me with occult blood negative on today's visit.   ?Imaging Studies: ? ?No imaging studies ordered for this patient ? ? ?Cardiac Monitoring: ? ?The patient was maintained on a cardiac monitor.  I personally viewed and interpreted the cardiac monitored which showed an underlying rhythm of: Afib ?EKG non-ischemic ? ? ?Medicines ordered: ? ?I ordered medication including RBCS  for symptomatic anemia. ?Reevaluation of the patient after these medicines showed that the patient stayed the same ?I have reviewed the patients home medicines and have made adjustments as needed ? ? ?Critical Interventions: ? ?Patient requiring blood transfusion with a hemoglobin of 6.2 on arrival.  According to family member who resides with him patient has been more winded lately.  Reports that he gets very short of breath with walking and feels fatigued. ? ?Consults: ? ?I requested consultation with Dr. Gretchen Short Nephrology,  and discussed lab, patient will be added to dialysis list while in the hospital. ? ? ? ?Reevaluation: ? ?After the interventions noted above I re-evaluated patient and found that they have : Stayed the same ? ? ?Social Determinants of Health: ? ?The patient's social determinants of health were a factor in the care of this patient ?1:25 PM spoke to patient's brother [name] who currently resides with patient.  He last saw patient yesterday, reports he is at his baseline.  However, has reported increased fatigue.  He  has had no falls, no fevers, no sick contacts. ? ? ? ?Problem List / ED Course: ? ?Patient here with recurrent episode of GI bleed.  History obtained by EMS who reports patient was at dialysis did not have di

## 2021-10-19 NOTE — Assessment & Plan Note (Addendum)
Continue PPI ?

## 2021-10-19 NOTE — Assessment & Plan Note (Addendum)
--  WBC WNL. Platelets low but stable. RUQ with normal appearing liver ?--needs outpatient follow-up ?

## 2021-10-19 NOTE — ED Notes (Signed)
When communicating with pt he will sometimes get off topic. Pt is hard to redirect.  ?

## 2021-10-19 NOTE — Assessment & Plan Note (Addendum)
--   continue Toprol-XL. Consider DOAC r/b in future. Defer to outpatient setting ?-- PCP planned referral to cardiology; will send ambulatory referral ?

## 2021-10-19 NOTE — Assessment & Plan Note (Addendum)
--   At baseline per brother. Is hard of hearing; does best responding with communication via written word ? ?

## 2021-10-19 NOTE — Consult Note (Addendum)
[facility] ?Renal Consultation Note  ?  ?Indication for Consultation:  Management of ESRD/hemodialysis, anemia, hypertension/volume, and secondary hyperparathyroidism. ? ?HPI: John Woodard is a 51 y.o. male with PMH including ESRD on dialysis TTS, HTN, a.fib, neurogenic bladder, and hearing loss who was sent to the ED from his outpatient dialysis center for hemoglobin of 6.5. Patient reported shortness of breath and weakness so he was sent to the ED without dialysis. In the ED, hemoglobin was 6.2, WBC 3.7, Plt 89, K+ 4.7, Ca 9.8, Alb 3.8, Cr 12.02, BUN 51 (near baseline). He did have a positive FOBT outpatient and was referred to GI, and had an appointment scheduled for 10/23/21. At time of my exam, patient does not seem to be sure why he is here. He is a poor historian at baseline. He denies any shortness of breath, chest pain, dizziness, abdominal pain, nausea, vomiting, diarrhea. Unclear if he has noticed any blood in his stool lately. Vital signs are stable and O2 saturations are in the 90's on room air. Nephrology has been consulted for management of ESRD.  Pt unable to answer direct questions-  talking to me about his meals and bowels- dont know his baseline ? ?Past Medical History:  ?Diagnosis Date  ? Anemia   ? Atrial fibrillation ([place])   ? Constipation   ? Dialysis patient [facility])   ? Dyspnea   ? Dysrhythmia   ? a-fib  ? ESRD on dialysis [facility])   ? Hemo [place], Th , Sat  ? GERD (gastroesophageal reflux disease)   ? Hard of hearing   ? Hypertension   ? Poor historian   ? Renal disorder   ? ?Past Surgical History:  ?Procedure Laterality Date  ? A/V SHUNTOGRAM Left 04/03/2017  ? Procedure: A/V Shuntogram;  Surgeon: Elam Dutch, MD;  Location: [facility] [facility];  Service: Cardiovascular;  Laterality: Left;  ? AV FISTULA PLACEMENT Right 09/19/2020  ? Procedure: RIGHT UPPER EXTREMITY FIRST STAGE BRACHIOCEPHALIC ARTERIOVENOUS (AV) FISTULA CREATION;  Surgeon: Serafina Mitchell, MD;   Location: [place];  Service: Vascular;  Laterality: Right;  ? AVF Left   ? BASCILIC VEIN TRANSPOSITION Left 04/09/2018  ? Procedure: FIRST STAGE BACILIC VEIN TRANSPOSITION LEFT ARM;  Surgeon: Serafina Mitchell, MD;  Location: [place];  Service: Vascular;  Laterality: Left;  ? COLONOSCOPY  2018  ? FISTULA SUPERFICIALIZATION Left 06/09/2018  ? Procedure: FISTULA SUPERFICIALIZATION LEFT UPPER ARM;  Surgeon: Serafina Mitchell, MD;  Location: [place] ;  Service: Vascular;  Laterality: Left;  ? FISTULOGRAM Left 07/16/2017  ? Procedure: FISTULOGRAM LEFT ARM ARTERIOVENOUS FISTULA;  Surgeon: Serafina Mitchell, MD;  Location[facility];  Service: Vascular;  Laterality: Left;  ? FLEXIBLE SIGMOIDOSCOPY  10/2016  ? PERIPHERAL VASCULAR BALLOON ANGIOPLASTY Left 04/03/2017  ? Procedure: PERIPHERAL VASCULAR BALLOON ANGIOPLASTY;  Surgeon: Elam Dutch, MD;  Location: [place] [facility];  Service: Cardiovascular;  Laterality: Left;  Arm AV fistula  ? REVISON OF ARTERIOVENOUS FISTULA Left 07/16/2017  ? Procedure: PLICATION  OF ARTERIOVENOUS FISTULA  LEFT ARM;  Surgeon: Serafina Mitchell, MD;  Location: [facility];  Service: Vascular;  Laterality: Left;  ? VASCULAR SURGERY    ? ?Family History  ?Problem Relation Age of Onset  ? Hypertension Mother   ? Diabetes Mellitus II Sister   ? ?Social History: ? reports that he has never smoked. He has never used smokeless tobacco. He reports that he does not drink alcohol and does not use drugs. ? ?  ROS: As per HPI otherwise negative. ? ?Physical Exam: ?Vitals:  ? 10/19/21 1137  ?BP: (!) 153/98  ?Pulse: 79  ?Resp: 14  ?TempSrc: Oral  ?SpO2: 98%  ?   ?General: Well developed, alert, in no acute distress. ?Head: Normocephalic, atraumatic, sclera non-icteric, mucus membranes are moist. ?Neck: JVD not elevated. ?Lungs: Clear bilaterally to auscultation without wheezes, rales, or rhonchi. Breathing is unlabored on RA. ?Heart: RRR with normal S1, S2. No murmurs, rubs, or gallops appreciated. ?Abdomen: Soft,  non-distended with normoactive bowel sounds.  ?Musculoskeletal:  Strength and tone appear normal for age. ?Lower extremities: No edema b/l lower extremities ?Neuro: Alert and Moves all extremities spontaneously. ?Dialysis Access: RUE AVF + t/b ? ?Allergies  ?Allergen Reactions  ? Naproxen Anaphylaxis and Other (See Comments)  ?  CARDIAC ARREST- "heart stopped one time"  ? Lactose Intolerance (Gi) Diarrhea and Other (See Comments)  ?  BLOODY STOOLS  ? Penicillins   ?  UNSPECIFIED REACTION   ? ?Prior to Admission medications   ?Medication Sig Start Date End Date Taking? Authorizing Provider  ?acetaminophen (TYLENOL) 325 MG tablet Take 325 mg by mouth every 6 (six) hours as needed for moderate pain.     [provider]  ?amLODipine (NORVASC) 5 MG tablet Take 10 mg by mouth at bedtime. 07/06/20   [provider]  ?cinacalcet (SENSIPAR) 30 MG tablet Take 30 mg by mouth See admin instructions. Tues, Thurs, Sat    [provider]  ?cloNIDine (CATAPRES) 0.3 MG tablet Take 1 tablet (0.3 mg total) by mouth 3 (three) times daily. 04/07/17   Aline August, MD  ?docusate sodium (COLACE) 100 MG capsule Take 100 mg by mouth at bedtime.    [provider]  ?doxazosin (CARDURA) 4 MG tablet Take 4 mg by mouth at bedtime.    [provider]  ?esomeprazole (NEXIUM) 40 MG capsule Take 40 mg by mouth daily before breakfast.    [provider]  ?furosemide (LASIX) 40 MG tablet Take 40 mg by mouth daily.    [provider]  ?heparin 1000 unit/mL SOLN injection Heparin Sodium (Porcine) 1,000 Units/mL Systemic 12/01/20 11/30/21  [provider]  ?HYDROcodone-acetaminophen (NORCO) 5-325 MG tablet Take 1 tablet by mouth every 6 (six) hours as needed for moderate pain. 09/19/20   Rhyne, Hulen Shouts, PA-C  ?losartan (COZAAR) 100 MG tablet Take 100 mg by mouth daily.    [provider]  ?Methoxy PEG-Epoetin Beta (MIRCERA IJ) Mircera 10/25/20 10/24/21  [provider]   ?metoprolol succinate (TOPROL-XL) 50 MG 24 hr tablet Take 1 tablet (50 mg total) by mouth at bedtime. Take with or immediately following a meal. 04/07/17   Aline August, MD  ?multivitamin (RENA-VIT) TABS tablet Take 1 tablet by mouth at bedtime.    [provider]  ?polyethylene glycol powder (GLYCOLAX/MIRALAX) 17 GM/SCOOP powder Take 17 g by mouth.    [provider]  ?sevelamer carbonate (RENVELA) 800 MG tablet Take 800-1,600 mg by mouth See admin instructions. 1600mg  three times daily with meals and 800mg  with one snack.    [provider]  ? ?No current facility-administered medications for this encounter.  ? ?Current Outpatient Medications  ?Medication Sig Dispense Refill  ? acetaminophen (TYLENOL) 325 MG tablet Take 325 mg by mouth every 6 (six) hours as needed for moderate pain.     ? amLODipine (NORVASC) 5 MG tablet Take 10 mg by mouth at bedtime.    ? cinacalcet (SENSIPAR) 30 MG tablet Take 30  mg by mouth See admin instructions. Tues, Thurs, Sat    ? cloNIDine (CATAPRES) 0.3 MG tablet Take 1 tablet (0.3 mg total) by mouth 3 (three) times daily. 90 tablet 0  ? docusate sodium (COLACE) 100 MG capsule Take 100 mg by mouth at bedtime.    ? doxazosin (CARDURA) 4 MG tablet Take 4 mg by mouth at bedtime.    ? esomeprazole (NEXIUM) 40 MG capsule Take 40 mg by mouth daily before breakfast.    ? furosemide (LASIX) 40 MG tablet Take 40 mg by mouth daily.    ? heparin 1000 unit/mL SOLN injection Heparin Sodium (Porcine) 1,000 Units/mL Systemic    ? HYDROcodone-acetaminophen (NORCO) 5-325 MG tablet Take 1 tablet by mouth every 6 (six) hours as needed for moderate pain. 8 tablet 0  ? losartan (COZAAR) 100 MG tablet Take 100 mg by mouth daily.    ? Methoxy PEG-Epoetin Beta (MIRCERA IJ) Mircera    ? metoprolol succinate (TOPROL-XL) 50 MG 24 hr tablet Take 1 tablet (50 mg total) by mouth at bedtime. Take with or immediately following a meal. 30 tablet 0  ? multivitamin (RENA-VIT) TABS tablet  Take 1 tablet by mouth at bedtime.    ? polyethylene glycol powder (GLYCOLAX/MIRALAX) 17 GM/SCOOP powder Take 17 g by mouth.    ? sevelamer carbonate (RENVELA) 800 MG tablet Take 800-1,600 mg by mouth See admin instructio

## 2021-10-19 NOTE — Assessment & Plan Note (Addendum)
--   diet-controlled. A1c 4.3 in 08/2020 ? ?

## 2021-10-19 NOTE — ED Triage Notes (Signed)
Patient BIB [facility] from dialysis center for evaluation of anemia (Hgb 6.5 on paperwork sent from facility), access in right arm, completed dialysis today. Patient alert, and in no apparent distress at this time. ? ?BP 140/84 ?Hr 80 ?RR 20 ?99% on room air ?CBG 134 ?

## 2021-10-19 NOTE — Assessment & Plan Note (Addendum)
--   with associated shortness of breath and fatigue, fecal occult negative with no history of N/V/abdominal pain or black/tarry or bloody stools. Has a +FOBT done at dialysis on 10/05/21.  ?-- s/p 3 unit RBC without improvement, Hgb stable ?-- EGD unrevealing; s/p colonoscopy ?-if further bleeding needs capsule ? ? ?

## 2021-10-19 NOTE — ED Notes (Signed)
Pt has a suprapubic catheter. Pt unsure when he received it.  ?

## 2021-10-19 NOTE — Assessment & Plan Note (Addendum)
--   Blood pressure better controlled today ?--On multiple agents outpatient, reportedly: metoprolol, losartan, clonidine, Norvasc, cardura, hydralazine, Lasix ?--continue clonidine, Lasix, losartan, metoprolol, hydralazine ?

## 2021-10-19 NOTE — Assessment & Plan Note (Addendum)
--   HD as per nephrology ?-- stable ?

## 2021-10-20 DIAGNOSIS — N186 End stage renal disease: Secondary | ICD-10-CM | POA: Diagnosis not present

## 2021-10-20 DIAGNOSIS — D649 Anemia, unspecified: Secondary | ICD-10-CM | POA: Diagnosis not present

## 2021-10-20 DIAGNOSIS — D61818 Other pancytopenia: Secondary | ICD-10-CM

## 2021-10-20 DIAGNOSIS — E0822 Diabetes mellitus due to underlying condition with diabetic chronic kidney disease: Secondary | ICD-10-CM

## 2021-10-20 DIAGNOSIS — I480 Paroxysmal atrial fibrillation: Secondary | ICD-10-CM | POA: Diagnosis not present

## 2021-10-20 DIAGNOSIS — Z992 Dependence on renal dialysis: Secondary | ICD-10-CM

## 2021-10-20 DIAGNOSIS — Z794 Long term (current) use of insulin: Secondary | ICD-10-CM

## 2021-10-20 LAB — CBC
HCT: 20.5 % — ABNORMAL LOW (ref 39.0–52.0)
Hemoglobin: 6.6 g/dL — CL (ref 13.0–17.0)
MCH: 26.7 pg (ref 26.0–34.0)
MCHC: 32.2 g/dL (ref 30.0–36.0)
MCV: 83 fL (ref 80.0–100.0)
Platelets: 75 10*3/uL — ABNORMAL LOW (ref 150–400)
RBC: 2.47 MIL/uL — ABNORMAL LOW (ref 4.22–5.81)
RDW: 14.6 % (ref 11.5–15.5)
WBC: 3.6 10*3/uL — ABNORMAL LOW (ref 4.0–10.5)
nRBC: 0 % (ref 0.0–0.2)

## 2021-10-20 LAB — BASIC METABOLIC PANEL
Anion gap: 12 (ref 5–15)
BUN: 61 mg/dL — ABNORMAL HIGH (ref 6–20)
CO2: 28 mmol/L (ref 22–32)
Calcium: 9.1 mg/dL (ref 8.9–10.3)
Chloride: 96 mmol/L — ABNORMAL LOW (ref 98–111)
Creatinine, Ser: 13.48 mg/dL — ABNORMAL HIGH (ref 0.61–1.24)
GFR, Estimated: 4 mL/min — ABNORMAL LOW (ref >60)
Glucose, Bld: 78 mg/dL (ref 70–99)
Potassium: 4.6 mmol/L (ref 3.5–5.1)
Sodium: 136 mmol/L (ref 135–145)

## 2021-10-20 LAB — HEPATITIS B SURFACE ANTIBODY,QUALITATIVE: Hep B S Ab: REACTIVE — AB

## 2021-10-20 LAB — GLUCOSE, CAPILLARY
Glucose-Capillary: 85 mg/dL (ref 70–99)
Glucose-Capillary: 93 mg/dL (ref 70–99)

## 2021-10-20 LAB — PREPARE RBC (CROSSMATCH)

## 2021-10-20 MED ORDER — SODIUM CHLORIDE 0.9% IV SOLUTION
Freq: Once | INTRAVENOUS | Status: AC
  Administered 2021-10-22: 500 mL via INTRAVENOUS

## 2021-10-20 MED ORDER — DARBEPOETIN ALFA 100 MCG/0.5ML IJ SOSY
PREFILLED_SYRINGE | INTRAMUSCULAR | Status: AC
  Administered 2021-10-20: 100 ug via INTRAVENOUS
  Filled 2021-10-20: qty 0.5

## 2021-10-20 NOTE — Progress Notes (Signed)
?Progress Note ? ? ?Patient: John Woodard QBH:[id number] DOB: April 15, 1971 DOA: 10/19/2021     0 ?DOS: the patient was seen and examined on 10/20/2021 ?  ?Brief hospital course: ?51 year old man PMH ESRD on dialysis TTS, multiple other medical problems; intellectual disability, found to have low hemoglobin at dialysis and sent to the emergency department.  Recent symptoms including dyspnea on exertion and fatigue.  No definite known bleeding.  Fecal occult negative in the emergency department.  Admitted for treatment of acute on chronic symptomatic anemia. ? ?Assessment and Plan: ?* Acute on chronic symptomatic anemia  ?-- with associated shortness of breath and fatigue, fecal occult negative with no history of N/V/abdominal pain or black/tarry or bloody stools. Has a +FOBT done at dialysis on 10/05/21.  ?-- s/p 1 unit RBC without improvement, getting additional unit with dialysis ?-- h/o acute on chronic anemia hospitalization in 2018 [facility] facility. EGD and colonoscopy done at that time. EGD on 11/03/16, found to have small hiatal hernia and hyperplastic gastric polyps with no bleeding and no explanation for IDA. He also underwent colonoscopy and it was normal as well. GI planned to do a capsule endoscopy as outpatient, but does not appear that this was done. ?-- will ask GI to see 4/3 ? ? ?ESRD on dialysis [facility]) ?-- Missed dialysis session 4/1 ?-- HD today   ? ?PAF (paroxysmal atrial fibrillation) ([place]) ?-- continue Toprol-XL. Consider DOAC r/b in future. ?-- outpatient f/u with cardiology ? 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 A1c 4.3 in 08/2020 ?-- SSI ? ?Hypertension ?-- Blood pressure well controlled. ?--On multiple agents reportedly: metoprolol, losartan, clonidine, Norvasc, cardura, hydralazine, Lasix ?--question compliance ? ?Neurogenic bladder ?--Has had suprapubic catheter since  2003 ?--Catheter care  ? ?GERD (gastroesophageal reflux disease) ?--Continue PPI  ? ?Intellectual disability ?-- At baseline per brother. Is hard of hearing; does best responding with communication via written word ? ? ?Pancytopenia ([place]) ?--History of pancytopenia. WBC at baseline. Platelets at 89 ?--needs outpatient follow-up ? ? ? ? ?  ? ?Subjective:  ?Feels ok ?History unreliable ? ?Physical Exam: ?Vitals:  ? 10/20/21 1400 10/20/21 1416 10/20/21 1417 10/20/21 1427  ?BP: (!) 170/90 (!) 180/96 (!) 182/111 (!) 180/110  ?Pulse: 72 70 63 75  ?Resp: 14 14 12 13   ?Temp:  98.2 ?F (36.8 ?C) 97.9 ?F (36.6 ?C) 97.9 ?F (36.6 ?C)  ?TempSrc:  Oral Oral Oral  ?SpO2:  100% 100% 99%  ?Weight:  80.3 kg    ? ?Physical Exam ?Vitals reviewed.  ?Constitutional:   ?   General: He is not in acute distress. ?   Appearance: He is not ill-appearing or toxic-appearing.  ?Cardiovascular:  ?   Rate and Rhythm: Normal rate. Rhythm irregular.  ?   Heart sounds: No murmur heard. ?   Comments: Telemetry atrial fibrillation ?Pulmonary:  ?   Effort: Pulmonary effort is normal. No respiratory distress.  ?   Breath sounds: No wheezing, rhonchi or rales.  ?Musculoskeletal:  ?   Right lower leg: No edema.  ?   Left lower leg: No edema.  ?Neurological:  ?   Mental Status: He is alert.  ?Psychiatric:  ?   Comments: Odd affect, speech is odd and inappropriate much of the time  ? ? ?Data Reviewed: ? ?CBG stable ?K+ 4.6 ?Hgb 6.6 s/p 1 unit PRBC ? ?Family Communication: brother by telephone ? ?Disposition: ?Status is: Observation ? ?  Planned Discharge Destination:  return to prior domicile ? ? ? ?Time spent: 25 minutes ? ?Author: ?Murray Hodgkins, MD ?10/20/2021 4:49 PM ? ?For on call review [email or web address].  ?

## 2021-10-20 NOTE — Care Management Obs Status (Signed)
MEDICARE OBSERVATION STATUS NOTIFICATION ? ? ?Patient Details  ?Name: John Woodard ?MRN: [id number] ?Date of Birth: 10-Jul-1971 ? ? ?Medicare Observation Status Notification Given:  Yes ? ? ? ?Dent Plantz Darnell Level., RN ?10/20/2021, 10:42 AM ?

## 2021-10-20 NOTE — Hospital Course (Addendum)
51 year old man PMH ESRD on dialysis TTS, multiple other medical problems; intellectual disability, found to have low hemoglobin at dialysis and sent to the emergency department.  Recent symptoms including dyspnea on exertion and fatigue.  No definite known bleeding.  Fecal occult negative in the emergency department.  Admitted for treatment of acute on chronic symptomatic anemia. Hgb up appropriately after 2 units PRBC. EGD unrevealing; patient initially refused bowel prep, but now agreeable, plans for lower endoscopy 4/5. ?

## 2021-10-20 NOTE — Progress Notes (Addendum)
?[facility] ?Progress Note  ? ?Subjective:   Seen in HD. Patient seems a bit confused, talking to himself throughout HD but does answer questions appropriately. Denies SOB, CP, dizziness. To receive 1 unit PRBC with HD -  resting when I see  ? ?Objective ?Vitals:  ? 10/20/21 0725 10/20/21 1016 10/20/21 1025 10/20/21 1030  ?BP: (!) 147/93 (!) 155/77 (!) 168/74 (!) 160/72  ?Pulse: 70 72 72 74  ?Resp: 14 14 16 16   ?Temp: 98.1 ?F (36.7 ?C) 98 ?F (36.7 ?C)    ?TempSrc: Oral Oral    ?SpO2: 94% 100%    ?Weight:  83 kg    ? ?Physical Exam ?General: WDWN male, alert and in NAD ?Heart: RRR, no murmurs, rubs or gallops ?Lungs: CTA anteriorly.  ?Abdomen: Soft, non-distended, +BS ?Extremities: trace edema bilateral lower extremities ?Dialysis Access: RUE AVF accessed ? ?Additional Objective ?Labs: ?Basic Metabolic Panel: ?Recent Labs  ?Lab 10/19/21 ?1150 10/20/21 ?0157  ?NA 138 136  ?K 4.7 4.6  ?CL 94* 96*  ?CO2 30 28  ?GLUCOSE 96 78  ?BUN 51* 61*  ?CREATININE 12.02* 13.48*  ?CALCIUM 9.8 9.1  ? ?Liver Function Tests: ?Recent Labs  ?Lab 10/19/21 ?1150  ?AST 20  ?ALT 16  ?ALKPHOS 62  ?BILITOT 0.1*  ?PROT 6.8  ?ALBUMIN 3.8  ? ?No results for input(s): LIPASE, AMYLASE in the last 168 hours. ?CBC: ?Recent Labs  ?Lab 10/19/21 ?1150 10/19/21 ?1937 10/20/21 ?0157  ?WBC 3.7* 4.1 3.6*  ?HGB 6.2* 6.6* 6.6*  ?HCT 19.4* 20.1* 20.5*  ?MCV 85.8 84.5 83.0  ?PLT 89* 84* 75*  ? ?Blood Culture ?   ?Component Value Date/Time  ? [place], CLEAN CATCH 03/19/2007 1550  ? [place] ADDED [id number] 03/19/2007 1550  ? CULT  03/19/2007 1550  ?  Multiple bacterial morphotypes present, none predominant. Suggest appropriate recollection if clinically indicated.  ? REPTSTATUS 03/21/2007 FINAL 03/19/2007 1550  ? ? ?Cardiac Enzymes: ?No results for input(s): CKTOTAL, CKMB, CKMBINDEX, TROPONINI in the last 168 hours. ?CBG: ?Recent Labs  ?Lab 10/19/21 ?1710 10/19/21 ?2242 10/20/21 ?0615  ?GLUCAP 73 106* 85  ? ?Iron Studies: No results for  input(s): IRON, TIBC, TRANSFERRIN, FERRITIN in the last 72 hours. ?@lablastinr3 @ ?Studies/Results: ?No results found. ?Medications: ? sodium chloride    ? ? sodium chloride   Intravenous Once  ? Chlorhexidine Gluconate Cloth  6 each Topical Q0600  ? [START ON 10/21/2021] cinacalcet  30 mg Oral Once per day on Mon Wed Fri  ? cloNIDine  0.3 mg Oral TID  ? darbepoetin (ARANESP) injection - DIALYSIS  100 mcg Intravenous Q Sun-HD  ? doxercalciferol  10 mcg Intravenous Q Sun-HD  ? [START ON 10/22/2021] doxercalciferol  10 mcg Intravenous Q T,Th,Sa-HD  ? furosemide  40 mg Oral Daily  ? insulin aspart  0-6 Units Subcutaneous TID WC  ? losartan  100 mg Oral Daily  ? metoprolol succinate  50 mg Oral QHS  ? multivitamin  1 tablet Oral QHS  ? pantoprazole  80 mg Oral Q1200  ? sevelamer carbonate  1,600 mg Oral TID WC  ? sevelamer carbonate  800 mg Oral With snacks  ? sodium chloride flush  3 mL Intravenous Q12H  ? ? ?Dialysis Orders: ? [facility]  on TTS . ?180NRe, 4 hr 30 min, BFR 450, DFR Auto 1.5, EDW 79.5kg, 2K, 2Ca, AVF 15g, heparin 2000 unit bolus and 1000 unit bolus mid HD ?Mircera 165mcg IV q 2 weeks- last dose 50mcg on  10/05/21 ?Hectorol 19mcg IV q HD ?Sensipar 119mcg PO q HD ? ?Assessment/Plan: ? Symptomatic anemia: Hgb 6.6, history of +FOBT outpatient but negative here. PRBC ordered, GI bleed work up per primary team. He is due for ESA and needs dose increased, ordered aranesp with dialysis today.  ? ESRD:  Attends dialysis on TTS schedule but was sent to ED Saturday without dialysis. Getting make up treatment today then resume TTS schedule. No heparin.  ? Hypertension/volume: Slightly volume overloaded today, UF goal 3L. Has a suprapubic catheter and reports he still makes "a few cups" of urine per day. He is on many outpatient BP meds (amlodipine 10mg  QHS, clonidine 0.3mg  TID, doxazosin 4mg  QHS, hydralazine 25mg  BID, losartan 100mg  QD, metoprolol ER 50mg  QHS). BP is only moderately elevated here  without amlodipine, doxazosin or hydralazine. I am not sure if he is actually taking all of these medications as he is a poor historian. Follow BP closely.  ? Metabolic bone disease: Calcium controlled. Continue hectorol, sensipar and binders ? Nutrition:  Currently NPO ? ?Anice Paganini, PA-C ?10/20/2021, 10:34 AM  ?[facility] ?Pager: [phone] ? ?Patient seen and examined, agree with above note with above modifications. Seen on HD-  hgb  6.6 giving one unit with HD today -  still confused  ?Corliss Parish, MD ?10/20/2021 ? ? ? ? ?

## 2021-10-20 NOTE — Procedures (Signed)
Patient was seen on dialysis and the procedure was supervised.  BFR 450  Via AVF BP is  high. ? ? Patient appears to be tolerating treatment well ? ?John Woodard ?10/20/2021 ? ?

## 2021-10-21 ENCOUNTER — Encounter (HOSPITAL_COMMUNITY): Payer: Self-pay | Admitting: Family Medicine

## 2021-10-21 ENCOUNTER — Observation Stay (HOSPITAL_COMMUNITY): Payer: Medicare Other

## 2021-10-21 DIAGNOSIS — Z888 Allergy status to other drugs, medicaments and biological substances status: Secondary | ICD-10-CM | POA: Diagnosis not present

## 2021-10-21 DIAGNOSIS — Z794 Long term (current) use of insulin: Secondary | ICD-10-CM | POA: Diagnosis not present

## 2021-10-21 DIAGNOSIS — Z91011 Allergy to milk products: Secondary | ICD-10-CM | POA: Diagnosis not present

## 2021-10-21 DIAGNOSIS — D62 Acute posthemorrhagic anemia: Secondary | ICD-10-CM | POA: Diagnosis not present

## 2021-10-21 DIAGNOSIS — E0822 Diabetes mellitus due to underlying condition with diabetic chronic kidney disease: Secondary | ICD-10-CM | POA: Diagnosis not present

## 2021-10-21 DIAGNOSIS — K317 Polyp of stomach and duodenum: Secondary | ICD-10-CM | POA: Diagnosis present

## 2021-10-21 DIAGNOSIS — E1122 Type 2 diabetes mellitus with diabetic chronic kidney disease: Secondary | ICD-10-CM | POA: Diagnosis present

## 2021-10-21 DIAGNOSIS — N319 Neuromuscular dysfunction of bladder, unspecified: Secondary | ICD-10-CM | POA: Diagnosis present

## 2021-10-21 DIAGNOSIS — Z833 Family history of diabetes mellitus: Secondary | ICD-10-CM | POA: Diagnosis not present

## 2021-10-21 DIAGNOSIS — K219 Gastro-esophageal reflux disease without esophagitis: Secondary | ICD-10-CM | POA: Diagnosis present

## 2021-10-21 DIAGNOSIS — F79 Unspecified intellectual disabilities: Secondary | ICD-10-CM | POA: Diagnosis present

## 2021-10-21 DIAGNOSIS — I120 Hypertensive chronic kidney disease with stage 5 chronic kidney disease or end stage renal disease: Secondary | ICD-10-CM | POA: Diagnosis present

## 2021-10-21 DIAGNOSIS — N186 End stage renal disease: Secondary | ICD-10-CM | POA: Diagnosis present

## 2021-10-21 DIAGNOSIS — Z87440 Personal history of urinary (tract) infections: Secondary | ICD-10-CM | POA: Diagnosis not present

## 2021-10-21 DIAGNOSIS — Z8249 Family history of ischemic heart disease and other diseases of the circulatory system: Secondary | ICD-10-CM | POA: Diagnosis not present

## 2021-10-21 DIAGNOSIS — H9190 Unspecified hearing loss, unspecified ear: Secondary | ICD-10-CM | POA: Diagnosis present

## 2021-10-21 DIAGNOSIS — Z880 Allergy status to penicillin: Secondary | ICD-10-CM | POA: Diagnosis not present

## 2021-10-21 DIAGNOSIS — D649 Anemia, unspecified: Secondary | ICD-10-CM | POA: Diagnosis present

## 2021-10-21 DIAGNOSIS — I480 Paroxysmal atrial fibrillation: Secondary | ICD-10-CM | POA: Diagnosis present

## 2021-10-21 DIAGNOSIS — D61818 Other pancytopenia: Secondary | ICD-10-CM | POA: Diagnosis present

## 2021-10-21 DIAGNOSIS — Z20822 Contact with and (suspected) exposure to covid-19: Secondary | ICD-10-CM | POA: Diagnosis present

## 2021-10-21 DIAGNOSIS — Z79899 Other long term (current) drug therapy: Secondary | ICD-10-CM | POA: Diagnosis not present

## 2021-10-21 DIAGNOSIS — N2581 Secondary hyperparathyroidism of renal origin: Secondary | ICD-10-CM | POA: Diagnosis present

## 2021-10-21 DIAGNOSIS — Z992 Dependence on renal dialysis: Secondary | ICD-10-CM | POA: Diagnosis not present

## 2021-10-21 DIAGNOSIS — I10 Essential (primary) hypertension: Secondary | ICD-10-CM | POA: Diagnosis not present

## 2021-10-21 DIAGNOSIS — M898X9 Other specified disorders of bone, unspecified site: Secondary | ICD-10-CM | POA: Diagnosis present

## 2021-10-21 DIAGNOSIS — K449 Diaphragmatic hernia without obstruction or gangrene: Secondary | ICD-10-CM | POA: Diagnosis not present

## 2021-10-21 DIAGNOSIS — R195 Other fecal abnormalities: Secondary | ICD-10-CM

## 2021-10-21 LAB — TYPE AND SCREEN
ABO/RH(D): O POS
Antibody Screen: NEGATIVE
Unit division: 0
Unit division: 0

## 2021-10-21 LAB — BPAM RBC
Blood Product Expiration Date: 202305032359
Blood Product Expiration Date: 202305042359
ISSUE DATE / TIME: 202304011438
ISSUE DATE / TIME: 202304021055
Unit Type and Rh: 5100
Unit Type and Rh: 5100

## 2021-10-21 LAB — GLUCOSE, CAPILLARY
Glucose-Capillary: 93 mg/dL (ref 70–99)
Glucose-Capillary: 96 mg/dL (ref 70–99)
Glucose-Capillary: 66 mg/dL — ABNORMAL LOW (ref 70–99)
Glucose-Capillary: 88 mg/dL (ref 70–99)

## 2021-10-21 LAB — CBC
HCT: 25.3 % — ABNORMAL LOW (ref 39.0–52.0)
Hemoglobin: 8.2 g/dL — ABNORMAL LOW (ref 13.0–17.0)
MCH: 27.2 pg (ref 26.0–34.0)
MCHC: 32.4 g/dL (ref 30.0–36.0)
MCV: 83.8 fL (ref 80.0–100.0)
Platelets: 90 10*3/uL — ABNORMAL LOW (ref 150–400)
RBC: 3.02 MIL/uL — ABNORMAL LOW (ref 4.22–5.81)
RDW: 14.4 % (ref 11.5–15.5)
WBC: 3.6 10*3/uL — ABNORMAL LOW (ref 4.0–10.5)
nRBC: 0 % (ref 0.0–0.2)

## 2021-10-21 LAB — PROTIME-INR
INR: 1 (ref 0.8–1.2)
Prothrombin Time: 13.6 seconds (ref 11.4–15.2)

## 2021-10-21 LAB — HEPATITIS B SURFACE ANTIBODY, QUANTITATIVE: Hep B S AB Quant (Post): 14.9 m[IU]/mL (ref >9.9)

## 2021-10-21 MED ORDER — METOCLOPRAMIDE HCL 5 MG/ML IJ SOLN
10.0000 mg | Freq: Four times a day (QID) | INTRAMUSCULAR | Status: AC
  Administered 2021-10-21: 10 mg via INTRAVENOUS
  Filled 2021-10-21: qty 2

## 2021-10-21 MED ORDER — PENTAFLUOROPROP-TETRAFLUOROETH EX AERO: 1.0000 "application " | INHALATION_SPRAY | CUTANEOUS | Status: DC | PRN

## 2021-10-21 MED ORDER — SODIUM CHLORIDE 0.9 % IV SOLN: 100.0000 mL | INTRAVENOUS | Status: DC | PRN

## 2021-10-21 MED ORDER — ALTEPLASE 2 MG IJ SOLR: 2.0000 mg | Freq: Once | INTRAMUSCULAR | Status: DC | PRN

## 2021-10-21 MED ORDER — LIDOCAINE-PRILOCAINE 2.5-2.5 % EX CREA
1.0000 "application " | TOPICAL_CREAM | CUTANEOUS | Status: DC | PRN
  Filled 2021-10-21: qty 5

## 2021-10-21 MED ORDER — LIDOCAINE HCL (PF) 1 % IJ SOLN: 5.0000 mL | INTRAMUSCULAR | Status: DC | PRN

## 2021-10-21 MED ORDER — AMLODIPINE BESYLATE 10 MG PO TABS
10.0000 mg | ORAL_TABLET | Freq: Every day | ORAL | Status: DC
Start: 2021-10-21 — End: 2021-10-23
  Administered 2021-10-21 – 2021-10-22 (×2): 10 mg via ORAL
  Filled 2021-10-21 (×2): qty 1

## 2021-10-21 MED ORDER — PEG-KCL-NACL-NASULF-NA ASC-C 100 G PO SOLR: 1.0000 | Freq: Once | ORAL | Status: DC

## 2021-10-21 MED ORDER — PEG-KCL-NACL-NASULF-NA ASC-C 100 G PO SOLR
0.5000 | Freq: Once | ORAL | Status: AC
  Administered 2021-10-23: 100 g via ORAL
  Filled 2021-10-21 (×3): qty 1

## 2021-10-21 MED ORDER — HEPARIN SODIUM (PORCINE) 1000 UNIT/ML DIALYSIS
1000.0000 [IU] | INTRAMUSCULAR | Status: DC | PRN
  Filled 2021-10-21: qty 1

## 2021-10-21 MED ORDER — HYDRALAZINE HCL 25 MG PO TABS
25.0000 mg | ORAL_TABLET | Freq: Two times a day (BID) | ORAL | Status: DC
  Administered 2021-10-21 – 2021-10-23 (×4): 25 mg via ORAL
  Filled 2021-10-21 (×4): qty 1

## 2021-10-21 MED ORDER — PEG-KCL-NACL-NASULF-NA ASC-C 100 G PO SOLR
0.5000 | Freq: Once | ORAL | Status: AC
  Administered 2021-10-22: 100 g via ORAL
  Filled 2021-10-21 (×2): qty 1

## 2021-10-21 MED ORDER — BISACODYL 5 MG PO TBEC
10.0000 mg | DELAYED_RELEASE_TABLET | Freq: Four times a day (QID) | ORAL | Status: AC
  Administered 2021-10-21 (×2): 10 mg via ORAL
  Filled 2021-10-21 (×2): qty 2

## 2021-10-21 NOTE — Progress Notes (Signed)
The patient is supposed to start on a bowl prep tonight for his scheduled EGD on 4/4, but the patient is refusing to drink it. The patient states drinking the large amount will cause severe cramping in his legs when he goes to dialysis to take off his fluid.  The on call hospitalist, Dr. Odella Aquas, was informed and said for the nurse to tell the patient that if he won't drink the prep he will need to stay in the hospital an extra day.  The nurse explained to the patient he will stay longer without doing the procedure but he still refused to drink the prep.  Will continue to monitor the patient. ? ?Lupita Dawn, RN ? ?

## 2021-10-21 NOTE — Consult Note (Addendum)
? ?                                                                          [facility] Consult: ?8:22 AM ?10/21/2021 ? LOS: 0 days  ? ? ?Referring Provider: Dr Sarajane Jews  ?Primary Care Physician:  Janith Lima, MD ?Primary Gastroenterologist:  Dr. Michail Sermon in [facility].    ? ? ? ?Reason for Consultation: Acute on chronic anemia in hemodialysis patient.  FOBT negative. ?  ?HPI: John Woodard is a 51 y.o. male.  Hx ESRD.  TTS HD.  Hypertension.  Intellectual disability, cognitive impairment, psych overlay.  Hearing loss.  PAF.  Numerous HD access, fistula interventions/procedures.  Neurogenic bladder, frequent UTIs, s/p suprapubic catheter.   ?10/2016 Colonoscopy for acute on chronic anemia with IDA.  Required 2 PRBCs for Hgb 6.1.  Procedure report not available but mentioned as normal in discharge summary. ?10/2016 EGD small HH.  Fundic gland gastric polyps.  Duodenal nodule pathology confirmed tubular adenoma.  Duodenal biopsies negative for distortion of villous architecture, positive for dilated lymphatics.  No explanation for anemia/blood loss. ?Per 2018 discharge summary from hospital in [place] was for outpatient capsule study but not clear that this was ever performed. ?No recent abdominal/pelvic advanced imaging. ?Home meds include Nexium 80 mg/daily, MiraLAX.  Receives Mircera injection every 2 weeks at dialysis center.  No mention of receiving iron supplements in renal notes. ?Recent Hgb at HD was 6.5, FOBT + w/O overt bleeding.   ? ?Presented to ED yesterday to ED at rec of renal provider for anemia: HR irregular but not tachy.  Denies SOB, N/V, black or bloody stool.  Endorsed abdominal discomfort.  Hypertensive.  Provided limited history.  Very focused on issues of taking laxatives as an outpatient.  He gets concerned because he does not want to have a fecal incontinence episode while  being transported to medical appointments. ? ?Hgb 6.2.Marland Kitchen  2 PRBC ... 8.2.  Hgb 8.5 a year ago.  MCV 83.  Platelets 75 K, 105 a year ago, 80 3 plus years ago.  No PT/INR.  TSH normal. ?  ?Patient lives with his brother [name] in [place].  Patient does not drink alcohol, use tobacco products or have any history of street drug use. ?Family history negative for ulcer disease, colorectal/gastric cancer, anemia, bleeding. ? ?Past Medical History:  ?Diagnosis Date  ? Anemia   ? Constipation   ? ESRD   ? HD TTS  ? ESRD on dialysis [facility])   ? Hemo [place], Th , Sat  ? GERD (gastroesophageal reflux disease)   ? Hard of hearing   ? Hypertension   ? PAF (paroxysmal atrial fibrillation) ([place])   ? ? ?Past Surgical History:  ?Procedure Laterality Date  ? A/V SHUNTOGRAM Left 04/03/2017  ? Procedure: A/V Shuntogram;  Surgeon: Elam Dutch, MD;  Location: [place] [facility];  Service: Cardiovascular;  Laterality: Left;  ? AV FISTULA PLACEMENT Right 09/19/2020  ? Procedure: RIGHT UPPER EXTREMITY FIRST STAGE BRACHIOCEPHALIC ARTERIOVENOUS (AV) FISTULA CREATION;  Surgeon: Serafina Mitchell, MD;  Location: [facility];  Service: Vascular;  Laterality: Right;  ? AVF Left   ? BASCILIC VEIN TRANSPOSITION Left 04/09/2018  ?  Procedure: FIRST STAGE BACILIC VEIN TRANSPOSITION LEFT ARM;  Surgeon: Serafina Mitchell, MD;  Location: [place];  Service: Vascular;  Laterality: Left;  ? COLONOSCOPY  2018  ? FISTULA SUPERFICIALIZATION Left 06/09/2018  ? Procedure: FISTULA SUPERFICIALIZATION LEFT UPPER ARM;  Surgeon: Serafina Mitchell, MD;  Location: [place];  Service: Vascular;  Laterality: Left;  ? FISTULOGRAM Left 07/16/2017  ? Procedure: FISTULOGRAM LEFT ARM ARTERIOVENOUS FISTULA;  Surgeon: Serafina Mitchell, MD;  Location: [facility];  Service: Vascular;  Laterality: Left;  ? FLEXIBLE SIGMOIDOSCOPY  10/2016  ? PERIPHERAL VASCULAR BALLOON ANGIOPLASTY Left 04/03/2017  ? Procedure: PERIPHERAL VASCULAR BALLOON ANGIOPLASTY;  Surgeon: Elam Dutch, MD;   Location: [place] [facility];  Service: Cardiovascular;  Laterality: Left;  Arm AV fistula  ? REVISON OF ARTERIOVENOUS FISTULA Left 07/16/2017  ? Procedure: PLICATION  OF ARTERIOVENOUS FISTULA  LEFT ARM;  Surgeon: Serafina Mitchell, MD;  Location: [facility];  Service: Vascular;  Laterality: Left;  ? VASCULAR SURGERY    ? ? ?Prior to Admission medications   ?Medication Sig Start Date End Date Taking? Authorizing Provider  ?amLODipine (NORVASC) 5 MG tablet Take 10 mg by mouth at bedtime. 07/06/20  Yes [provider]  ?cinacalcet (SENSIPAR) 30 MG tablet Take 30 mg by mouth 3 (three) times a week. Tues, Thurs, Sat   Yes [provider]  ?cloNIDine (CATAPRES) 0.3 MG tablet Take 1 tablet (0.3 mg total) by mouth 3 (three) times daily. 04/07/17  Yes Aline August, MD  ?doxazosin (CARDURA) 4 MG tablet Take 4 mg by mouth at bedtime.   Yes [provider]  ?esomeprazole (NEXIUM) 40 MG capsule Take 40 mg by mouth daily before breakfast.   Yes [provider]  ?furosemide (LASIX) 40 MG tablet Take 40 mg by mouth daily.   Yes [provider]  ?hydrALAZINE (APRESOLINE) 25 MG tablet Take 25 mg by mouth 2 (two) times daily. 08/22/21  Yes [provider]  ?losartan (COZAAR) 100 MG tablet Take 100 mg by mouth daily.   Yes [provider]  ?metoprolol succinate (TOPROL-XL) 50 MG 24 hr tablet Take 1 tablet (50 mg total) by mouth at bedtime. Take with or immediately following a meal. 04/07/17  Yes Aline August, MD  ?multivitamin (RENA-VIT) TABS tablet Take 1 tablet by mouth at bedtime.   Yes [provider]  ?sevelamer carbonate (RENVELA) 800 MG tablet Take 800-1,600 mg by mouth in the morning, at noon, in the evening, and at bedtime. Take 2 tablets (1600 mg) three times daily with meals and 1 tablet (800 mg) with one snack.   Yes [provider]  ?acetaminophen (TYLENOL) 325 MG tablet Take 325 mg by mouth every 6 (six) hours as needed for moderate pain.   ?Patient not taking: Reported on 10/19/2021    [provider]  ?docusate sodium (COLACE) 100 MG capsule Take 100 mg by mouth at bedtime. ?Patient not taking: Reported on 10/19/2021    [provider]  ?heparin 1000 unit/mL SOLN injection Heparin Sodium (Porcine) 1,000 Units/mL Systemic ?Patient not taking: Reported on 10/19/2021 12/01/20 11/30/21  [provider]  ?HYDROcodone-acetaminophen (NORCO) 5-325 MG tablet Take 1 tablet by mouth every 6 (six) hours as needed for moderate pain. ?Patient not taking: Reported on 10/19/2021 09/19/20   Gabriel Earing, PA-C  ?Methoxy PEG-Epoetin Beta (MIRCERA IJ) Mircera ?Patient not taking: Reported on 10/19/2021 10/25/20 10/24/21  [provider]  ?polyethylene glycol powder (GLYCOLAX/MIRALAX) 17 GM/SCOOP powder Take 17 g by  mouth. ?Patient not taking: Reported on 10/19/2021    [provider]  ? ? ?Scheduled Meds: ? sodium chloride   Intravenous Once  ? Chlorhexidine Gluconate Cloth  6 each Topical Q0600  ? cinacalcet  30 mg Oral Once per day on Mon Wed Fri  ? cloNIDine  0.3 mg Oral TID  ? darbepoetin (ARANESP) injection - DIALYSIS  100 mcg Intravenous Q Sun-HD  ? [START ON 10/22/2021] doxercalciferol  10 mcg Intravenous Q T,Th,Sa-HD  ? furosemide  40 mg Oral Daily  ? insulin aspart  0-6 Units Subcutaneous TID WC  ? losartan  100 mg Oral Daily  ? metoprolol succinate  50 mg Oral QHS  ? multivitamin  1 tablet Oral QHS  ? pantoprazole  80 mg Oral Q1200  ? sevelamer carbonate  1,600 mg Oral TID WC  ? sevelamer carbonate  800 mg Oral With snacks  ? sodium chloride flush  3 mL Intravenous Q12H  ? ?Infusions: ? sodium chloride    ? ?PRN Meds: ?sodium chloride, acetaminophen **OR** acetaminophen, sodium chloride flush ? ? ?Allergies as of 10/19/2021 - Review Complete 10/19/2021  ?Allergen Reaction Noted  ? Naproxen Anaphylaxis and Other (See Comments) 05/07/2015  ? Lactose intolerance (gi) Diarrhea and Other (See Comments) 05/07/2015  ? Penicillins Other  (See Comments) 12/30/2012  ? ? ?Family History  ?Problem Relation Age of Onset  ? Hypertension Mother   ? Diabetes Mellitus II Sister   ? ? ?Social History  ? ?Socioeconomic History  ? Marital status: Single  ?  [place]

## 2021-10-21 NOTE — Anesthesia Preprocedure Evaluation (Addendum)
Anesthesia Evaluation  ?Patient identified by MRN, date of birth, ID band ?Patient awake ? ? ? ?Reviewed: ?Allergy & Precautions, NPO status , Patient's Chart, lab work & pertinent test results ? ?Airway ?Mallampati: II ? ?TM Distance: >3 FB ? ? ? ? Dental ?  ?Pulmonary ? ?  ?breath sounds clear to auscultation ? ? ? ? ? ? Cardiovascular ?hypertension,  ?Rhythm:Regular Rate:Normal ? ? ?  ?Neuro/Psych ?PSYCHIATRIC DISORDERS  Neuromuscular disease   ? GI/Hepatic ?Neg liver ROS, GERD  ,  ?Endo/Other  ?diabetes ? Renal/GU ?Renal disease  ? ?  ?Musculoskeletal ? ? Abdominal ?  ?Peds ? Hematology ?  ?Anesthesia Other Findings ? ? Reproductive/Obstetrics ? ?  ? ? ? ? ? ? ? ? ? ? ? ? ? ?  ?  ? ? ? ? ? ? ? ?Anesthesia Physical ?Anesthesia Plan ? ?ASA: 3 ? ?Anesthesia Plan: MAC  ? ?Post-op Pain Management:   ? ?Induction:  ? ?PONV Risk Score and Plan: Treatment may vary due to age or medical condition ? ?Airway Management Planned: Nasal Cannula and Simple Face Mask ? ?Additional Equipment:  ? ?Intra-op Plan:  ? ?Post-operative Plan:  ? ?Informed Consent: I have reviewed the patients History and Physical, chart, labs and discussed the procedure including the risks, benefits and alternatives for the proposed anesthesia with the patient or authorized representative who has indicated his/her understanding and acceptance.  ? ? ? ?Dental advisory given ? ?Plan Discussed with: Anesthesiologist and CRNA ? ?Anesthesia Plan Comments:   ? ? ? ? ? ?Anesthesia Quick Evaluation ? ?

## 2021-10-21 NOTE — Progress Notes (Signed)
?[facility] ?Progress Note  ? ?Subjective:    ?Seem and examined patient at bedside. Tolerated yesterday's HD with net UF 3L. S/p 1 unit PRBC with HD for Hgb 6.6. Hgb now 8.2. Per GI, plan for EGD/Colonoscopy. Currently denies SOB, CP, and N/V. Plan for HD 10/22/21 per his usual schedule. ? ?Objective ?Vitals:  ? 10/20/21 2148 10/21/21 0354 10/21/21 0729 10/21/21 1135  ?BP: (!) 142/86 (!) 158/112 (!) 168/104 (!) 172/124  ?Pulse: 67 71 70 88  ?Resp: $Remov'12 14 18 14  'nNZgDQ$ ?Temp: 97.6 ?F (36.4 ?C) 97.7 ?F (36.5 ?C) 98 ?F (36.7 ?C) 98 ?F (36.7 ?C)  ?TempSrc: Oral Oral Oral Oral  ?SpO2: 98% 99% 95% 100%  ?Weight:      ? ?Physical Exam ?General: WDWN male, alert and in NAD, communicates well with white board ?Heart: RRR, no murmurs, rubs or gallops ?Lungs: CTA anteriorly.  ?Abdomen: Soft, non-distended, +BS ?Extremities: no edema bilateral lower extremities ?Dialysis Access: RUE AVF (+) B/T ? ?Filed Weights  ? 10/19/21 1928 10/20/21 1016 10/20/21 1416  ?Weight: 84.5 kg 83 kg 80.3 kg  ? ? ?Intake/Output Summary (Last 24 hours) at 10/21/2021 1313 ?Last data filed at 10/20/2021 1416 ?Gross per 24 hour  ?Intake --  ?Output 3000 ml  ?Net -3000 ml  ? ? ?Additional Objective ?Labs: ?Basic Metabolic Panel: ?Recent Labs  ?Lab 10/19/21 ?1150 10/20/21 ?0157  ?NA 138 136  ?K 4.7 4.6  ?CL 94* 96*  ?CO2 30 28  ?GLUCOSE 96 78  ?BUN 51* 61*  ?CREATININE 12.02* 13.48*  ?CALCIUM 9.8 9.1  ? ?Liver Function Tests: ?Recent Labs  ?Lab 10/19/21 ?1150  ?AST 20  ?ALT 16  ?ALKPHOS 62  ?BILITOT 0.1*  ?PROT 6.8  ?ALBUMIN 3.8  ? ?No results for input(s): LIPASE, AMYLASE in the last 168 hours. ?CBC: ?Recent Labs  ?Lab 10/19/21 ?1150 10/19/21 ?1937 10/20/21 ?0157 10/21/21 ?1027  ?WBC 3.7* 4.1 3.6* 3.6*  ?HGB 6.2* 6.6* 6.6* 8.2*  ?HCT 19.4* 20.1* 20.5* 25.3*  ?MCV 85.8 84.5 83.0 83.8  ?PLT 89* 84* 75* 90*  ? ?Blood Culture ?   ?Component Value Date/Time  ? [place], CLEAN CATCH 03/19/2007 1550  ? [place] ADDED [id number] 03/19/2007 1550  ? CULT   03/19/2007 1550  ?  Multiple bacterial morphotypes present, none predominant. Suggest appropriate recollection if clinically indicated.  ? REPTSTATUS 03/21/2007 FINAL 03/19/2007 1550  ? ? ?Cardiac Enzymes: ?No results for input(s): CKTOTAL, CKMB, CKMBINDEX, TROPONINI in the last 168 hours. ?CBG: ?Recent Labs  ?Lab 10/19/21 ?2242 10/20/21 ?0615 10/20/21 ?1741 10/21/21 ?7425 10/21/21 ?1135  ?GLUCAP 106* 85 93 93 96  ? ?Iron Studies: No results for input(s): IRON, TIBC, TRANSFERRIN, FERRITIN in the last 72 hours. ?Lab Results  ?Component Value Date  ? INR 1.0 10/21/2021  ? INR 0.98 04/03/2017  ? ?Studies/Results: ?US Abdomen Complete ? ?Result Date: 10/21/2021 ?CLINICAL DATA:  Thrombocytopenia.  End-stage renal disease EXAM: ABDOMEN ULTRASOUND COMPLETE COMPARISON:  Renal ultrasound 12/24/2020 CT 10/01/2017 FINDINGS: Gallbladder: Contracted. No gallbladder wall thickening pericholecystic fluid. No echogenic gallstones identified. Negative sonographic Murphy's sign Common bile duct: Diameter: Normal at 3 mm Liver: No focal lesion identified. Within normal limits in parenchymal echogenicity. Portal vein is patent on color Doppler imaging with normal direction of blood flow towards the liver. IVC: No abnormality visualized. Pancreas: Visualized portion unremarkable. Spleen: Size and appearance within normal limits. Right Kidney: Length: 9.3 cm. Multiple anechoic cysts. Largest cyst measures 3.3 cm. Smaller cysts measuring 1-2 cm. No hydronephrosis Left  Kidney: Length: 10.5 cm. Multiple anechoic cysts. Cysts range from 1-3 cm. No hydronephrosis. Abdominal aorta: No aneurysm visualized. Other findings: Small RIGHT pleural effusion IMPRESSION: 1. Contracted gallbladder.  No evidence of cholecystitis. 2. Multiple bilateral benign-appearing renal cysts. No change from comparison ultrasound. No hydronephrosis. 3. No biliary duct dilatation. 4. Small RIGHT effusion noted. Electronically Signed   By: Suzy Bouchard M.D.   On:  10/21/2021 11:44   ? ?Medications: ? sodium chloride    ? ? sodium chloride   Intravenous Once  ? bisacodyl  10 mg Oral Q6H  ? Chlorhexidine Gluconate Cloth  6 each Topical Q0600  ? cinacalcet  30 mg Oral Once per day on Mon Wed Fri  ? cloNIDine  0.3 mg Oral TID  ? darbepoetin (ARANESP) injection - DIALYSIS  100 mcg Intravenous Q Sun-HD  ? [START ON 10/22/2021] doxercalciferol  10 mcg Intravenous Q T,Th,Sa-HD  ? furosemide  40 mg Oral Daily  ? insulin aspart  0-6 Units Subcutaneous TID WC  ? losartan  100 mg Oral Daily  ? metoCLOPramide (REGLAN) injection  10 mg Intravenous Q6H  ? metoprolol succinate  50 mg Oral QHS  ? multivitamin  1 tablet Oral QHS  ? pantoprazole  80 mg Oral Q1200  ? peg 3350 powder  0.5 kit Oral Once  ? Followed by  ? peg 3350 powder  0.5 kit Oral Once  ? sevelamer carbonate  1,600 mg Oral TID WC  ? sevelamer carbonate  800 mg Oral With snacks  ? sodium chloride flush  3 mL Intravenous Q12H  ? ? ?Dialysis Orders: ?[facility]  on TTS . ?180NRe, 4 hr 30 min, BFR 450, DFR Auto 1.5, EDW 79.5kg, 2K, 2Ca, AVF 15g, heparin 2000 unit bolus and 1000 unit bolus mid HD ?Mircera 168mcg IV q 2 weeks- last dose 62mcg on 10/05/21 ?Hectorol 92mcg IV q HD ?Sensipar 174mcg PO q HD ? ?Assessment/Plan: ?Symptomatic anemia: Hgb 6.6 yesterday, history of +FOBT outpatient but negative here. PRBC ordered, GI bleed work up per primary team. He is due for ESA and needs dose increased, ordered aranesp with dialysis. Hgb now 8.2-monitor trends closely. ?ESRD:  Attends dialysis on TTS schedule but was sent to ED Saturday without dialysis. Getting make up treatment today then resume TTS schedule. No heparin. Plan for HD 10/22/21.  ?Hypertension/volume: Volume appears to be improved after yesterday's HD. Has a suprapubic catheter and reports he still makes "a few cups" of urine per day. He is on many outpatient BP meds (amlodipine $RemoveBeforeDE'10mg'dJNitEokbcvwAer$  QHS, clonidine 0.$RemoveBeforeDE'3mg'fLPQuBGdOxTUhEM$  TID, doxazosin $RemoveBefore'4mg'wnbBOpkfeIZjQ$  QHS, hydralazine $RemoveBeforeDE'25mg'zcjZqLcbJVUvBhH$  BID, losartan  $RemoveB'100mg'YPEpWnXF$  QD, metoprolol ER $RemoveBefor'50mg'lEKrDqnNbuJu$  QHS). BP is only moderately elevated here without amlodipine, doxazosin or hydralazine. I am not sure if he is actually taking all of these medications as he is a poor historian. Follow BP closely.  ?Metabolic bone disease: Calcium controlled. Continue hectorol, sensipar and binders ?Nutrition:  NPO after midnight to prep for EGD/Colonoscopy  ? ?Tobie Poet, NP ?[facility] ?10/21/2021,1:13 PM ? LOS: 0 days  ?  ?

## 2021-10-21 NOTE — Progress Notes (Signed)
?  Progress Note ? ? ?Patient: John Woodard TXM:[id number] DOB: 05-Jan-1971 DOA: 10/19/2021     0 ?DOS: the patient was seen and examined on 10/21/2021 ?  ?Brief hospital course: ?51 year old man PMH ESRD on dialysis TTS, multiple other medical problems; intellectual disability, found to have low hemoglobin at dialysis and sent to the emergency department.  Recent symptoms including dyspnea on exertion and fatigue.  No definite known bleeding.  Fecal occult negative in the emergency department.  Admitted for treatment of acute on chronic symptomatic anemia. Hgb up appropriately after 2 units PRBC. Gi plans upper and lower endoscopy. ? ?Assessment and Plan: ?* Acute on chronic symptomatic anemia  ?-- with associated shortness of breath and fatigue, fecal occult negative with no history of N/V/abdominal pain or black/tarry or bloody stools. Has a +FOBT done at dialysis on 10/05/21.  ?-- s/p 3 unit RBC without improvement, CBC in AM ?-- GI plans upper and lower endoscopy to investigate +/- capsule ? ? ?ESRD on dialysis [facility]) ?-- Missed dialysis session 4/1 ?-- HD as per nephrology ? ?PAF (paroxysmal atrial fibrillation) ([place]) ?-- continue Toprol-XL. Consider DOAC r/b in future. ?-- outpatient f/u with cardiology ? 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 A1c 4.3 in 08/2020 ?-- no need for SSI ? ?Hypertension ?-- Blood pressure elevated ?--On multiple agents reportedly: metoprolol, losartan, clonidine, Norvasc, cardura, hydralazine, Lasix ?--continue clonidine, Lasix, losartan, metoprolol, resume hydralazine ? ?Neurogenic bladder ?--Has had suprapubic catheter since 2003 ?--Catheter care  ? ?GERD (gastroesophageal reflux disease) ?--Continue PPI  ? ?Intellectual disability ?-- At baseline per brother. Is hard of hearing; does best responding with communication via written word ? ? ?Pancytopenia ([place]) ?--History  of pancytopenia. WBC at baseline. Platelets stable ?--needs outpatient follow-up ? ? ? ? ?  ? ?Subjective:  ?Feels ok ? ?Physical Exam: ?Vitals:  ? 10/20/21 2148 10/21/21 0354 10/21/21 0729 10/21/21 1135  ?BP: (!) 142/86 (!) 158/112 (!) 168/104 (!) 172/124  ?Pulse: 67 71 70 88  ?Resp: 12 14 18 14   ?Temp: 97.6 ?F (36.4 ?C) 97.7 ?F (36.5 ?C) 98 ?F (36.7 ?C) 98 ?F (36.7 ?C)  ?TempSrc: Oral Oral Oral Oral  ?SpO2: 98% 99% 95% 100%  ?Weight:      ? ?Physical Exam ?Vitals reviewed.  ?Constitutional:   ?   General: He is not in acute distress. ?   Appearance: He is not ill-appearing or toxic-appearing.  ?Cardiovascular:  ?   Rate and Rhythm: Normal rate and regular rhythm.  ?   Heart sounds: No murmur heard. ?   Comments: Telemetry afib ?Pulmonary:  ?   Effort: Pulmonary effort is normal. No respiratory distress.  ?   Breath sounds: No wheezing, rhonchi or rales.  ?Musculoskeletal:  ?   Right lower leg: No edema.  ?   Left lower leg: No edema.  ?Neurological:  ?   Mental Status: He is alert.  ?Psychiatric:     ?   Mood and Affect: Mood normal.     ?   Behavior: Behavior normal.  ? ? ?Data Reviewed: ? ?CBG stable ?Hgb up to 8.2 ? ?Family Communication:  ? ?Disposition: ?Status is: Inpatient ?Remains inpatient appropriate because: anemia, plan for GI investigation ? Planned Discharge Destination: Home ? ? ? ?Time spent: 20 minutes ? ?Author: ?Murray Hodgkins, MD ?10/21/2021 2:13 PM ? ?For on call review [email or web address].  ?

## 2021-10-22 ENCOUNTER — Inpatient Hospital Stay (HOSPITAL_COMMUNITY): Payer: Medicare Other | Admitting: Anesthesiology

## 2021-10-22 ENCOUNTER — Encounter (HOSPITAL_COMMUNITY): Payer: Self-pay | Admitting: Family Medicine

## 2021-10-22 ENCOUNTER — Encounter (HOSPITAL_COMMUNITY)
Admission: EM | Disposition: A | Discharge status: Home / Self Care | Payer: Self-pay | Source: Home / Self Care | Attending: Family Medicine

## 2021-10-22 DIAGNOSIS — I10 Essential (primary) hypertension: Secondary | ICD-10-CM

## 2021-10-22 DIAGNOSIS — E0822 Diabetes mellitus due to underlying condition with diabetic chronic kidney disease: Secondary | ICD-10-CM | POA: Diagnosis not present

## 2021-10-22 DIAGNOSIS — D696 Thrombocytopenia, unspecified: Secondary | ICD-10-CM

## 2021-10-22 DIAGNOSIS — N186 End stage renal disease: Secondary | ICD-10-CM | POA: Diagnosis not present

## 2021-10-22 DIAGNOSIS — I480 Paroxysmal atrial fibrillation: Secondary | ICD-10-CM | POA: Diagnosis not present

## 2021-10-22 DIAGNOSIS — D62 Acute posthemorrhagic anemia: Secondary | ICD-10-CM

## 2021-10-22 DIAGNOSIS — K317 Polyp of stomach and duodenum: Secondary | ICD-10-CM

## 2021-10-22 DIAGNOSIS — D649 Anemia, unspecified: Secondary | ICD-10-CM | POA: Diagnosis not present

## 2021-10-22 DIAGNOSIS — K449 Diaphragmatic hernia without obstruction or gangrene: Secondary | ICD-10-CM

## 2021-10-22 HISTORY — PX: BIOPSY: SHX5522

## 2021-10-22 HISTORY — PX: POLYPECTOMY: SHX5525

## 2021-10-22 HISTORY — PX: ESOPHAGOGASTRODUODENOSCOPY (EGD) WITH PROPOFOL: SHX5813

## 2021-10-22 LAB — CBC WITH DIFFERENTIAL/PLATELET
Abs Immature Granulocytes: 0.03 10*3/uL (ref 0.00–0.07)
Basophils Absolute: 0 10*3/uL (ref 0.0–0.1)
Basophils Relative: 0 %
Eosinophils Absolute: 0 10*3/uL (ref 0.0–0.5)
Eosinophils Relative: 1 %
HCT: 25.6 % — ABNORMAL LOW (ref 39.0–52.0)
Hemoglobin: 8.5 g/dL — ABNORMAL LOW (ref 13.0–17.0)
Immature Granulocytes: 1 %
Lymphocytes Relative: 15 %
Lymphs Abs: 0.8 10*3/uL (ref 0.7–4.0)
MCH: 27.4 pg (ref 26.0–34.0)
MCHC: 33.2 g/dL (ref 30.0–36.0)
MCV: 82.6 fL (ref 80.0–100.0)
Monocytes Absolute: 0.4 10*3/uL (ref 0.1–1.0)
Monocytes Relative: 7 %
Neutro Abs: 4 10*3/uL (ref 1.7–7.7)
Neutrophils Relative %: 76 %
Platelets: 75 10*3/uL — ABNORMAL LOW (ref 150–400)
RBC: 3.1 MIL/uL — ABNORMAL LOW (ref 4.22–5.81)
RDW: 14.2 % (ref 11.5–15.5)
WBC: 5.2 10*3/uL (ref 4.0–10.5)
nRBC: 0 % (ref 0.0–0.2)

## 2021-10-22 LAB — RENAL FUNCTION PANEL
Albumin: 3.5 g/dL (ref 3.5–5.0)
Anion gap: 15 (ref 5–15)
BUN: 48 mg/dL — ABNORMAL HIGH (ref 6–20)
CO2: 24 mmol/L (ref 22–32)
Calcium: 9.7 mg/dL (ref 8.9–10.3)
Chloride: 97 mmol/L — ABNORMAL LOW (ref 98–111)
Creatinine, Ser: 13.01 mg/dL — ABNORMAL HIGH (ref 0.61–1.24)
GFR, Estimated: 4 mL/min — ABNORMAL LOW (ref >60)
Glucose, Bld: 91 mg/dL (ref 70–99)
Phosphorus: 5.9 mg/dL — ABNORMAL HIGH (ref 2.5–4.6)
Potassium: 4 mmol/L (ref 3.5–5.1)
Sodium: 136 mmol/L (ref 135–145)

## 2021-10-22 LAB — GLUCOSE, CAPILLARY
Glucose-Capillary: 99 mg/dL (ref 70–99)
Glucose-Capillary: 63 mg/dL — ABNORMAL LOW (ref 70–99)
Glucose-Capillary: 87 mg/dL (ref 70–99)
Glucose-Capillary: 91 mg/dL (ref 70–99)
Glucose-Capillary: 95 mg/dL (ref 70–99)

## 2021-10-22 SURGERY — ESOPHAGOGASTRODUODENOSCOPY (EGD) WITH PROPOFOL
Anesthesia: Monitor Anesthesia Care

## 2021-10-22 MED ORDER — PANTOPRAZOLE SODIUM 40 MG PO TBEC
40.0000 mg | DELAYED_RELEASE_TABLET | Freq: Two times a day (BID) | ORAL | Status: DC
  Administered 2021-10-22 – 2021-10-23 (×3): 40 mg via ORAL
  Filled 2021-10-22 (×3): qty 1

## 2021-10-22 MED ORDER — HYDRALAZINE HCL 20 MG/ML IJ SOLN
INTRAMUSCULAR | Status: AC
  Filled 2021-10-22: qty 1

## 2021-10-22 MED ORDER — HYDRALAZINE HCL 20 MG/ML IJ SOLN
10.0000 mg | Freq: Once | INTRAMUSCULAR | Status: AC
  Administered 2021-10-22: 10 mg via INTRAVENOUS

## 2021-10-22 MED ORDER — GLUCAGON HCL RDNA (DIAGNOSTIC) 1 MG IJ SOLR
INTRAMUSCULAR | Status: AC
  Filled 2021-10-22: qty 1

## 2021-10-22 MED ORDER — PROPOFOL 10 MG/ML IV BOLUS
INTRAVENOUS | Status: DC | PRN
  Administered 2021-10-22 (×2): 30 mg via INTRAVENOUS
  Administered 2021-10-22: 20 mg via INTRAVENOUS

## 2021-10-22 MED ORDER — ALPRAZOLAM 0.25 MG PO TABS
0.2500 mg | ORAL_TABLET | Freq: Once | ORAL | Status: AC
  Administered 2021-10-22: 0.25 mg via ORAL
  Filled 2021-10-22: qty 1

## 2021-10-22 MED ORDER — PROPOFOL 500 MG/50ML IV EMUL
INTRAVENOUS | Status: DC | PRN
  Administered 2021-10-22: 120 ug/kg/min via INTRAVENOUS

## 2021-10-22 SURGICAL SUPPLY — 15 items

## 2021-10-22 NOTE — Progress Notes (Signed)
?Progress Note ? ? ?Patient: John Woodard TML:[id number] DOB: 1971/03/21 DOA: 10/19/2021     1 ?DOS: the patient was seen and examined on 10/22/2021 ?  ?Brief hospital course: ?51 year old man PMH ESRD on dialysis TTS, multiple other medical problems; intellectual disability, found to have low hemoglobin at dialysis and sent to the emergency department.  Recent symptoms including dyspnea on exertion and fatigue.  No definite known bleeding.  Fecal occult negative in the emergency department.  Admitted for treatment of acute on chronic symptomatic anemia. Hgb up appropriately after 2 units PRBC. EGD unrevealing; patient initially refused bowel prep, but now agreeable, plans for lower endoscopy 4/5. ? ?Assessment and Plan: ?* Acute on chronic symptomatic anemia  ?-- with associated shortness of breath and fatigue, fecal occult negative with no history of N/V/abdominal pain or black/tarry or bloody stools. Has a +FOBT done at dialysis on 10/05/21.  ?-- s/p 3 unit RBC without improvement, Hgb stable ?-- EGD unrevealing; lower endoscopy planned 4/5 +/- capsule ?-- anemia panel in AM ? ? ?ESRD on dialysis [facility]) ?-- Missed dialysis session 4/1 ?-- HD as per nephrology ?-- stable ? ?PAF (paroxysmal atrial fibrillation) ([place]) ?-- continue Toprol-XL. Consider DOAC r/b in future. Defer to outpatient setting ?-- PCP planned referral to cardiology; will send ambulatory referral on discharge ? 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 A1c 4.3 in 08/2020 ?-- no need for SSI ? ?Hypertension ?-- Blood pressure better controlled today ?--On multiple agents outpatient, reportedly: metoprolol, losartan, clonidine, Norvasc, cardura, hydralazine, Lasix ?--continue clonidine, Lasix, losartan, metoprolol, hydralazine ? ?Neurogenic bladder ?--Has had suprapubic catheter since 2003 ?--Catheter care  ?--follow-up as an  outpatient ? ?GERD (gastroesophageal reflux disease) ?--Continue PPI  ? ?Intellectual disability ?-- At baseline per brother. Is hard of hearing; does best responding with communication via written word ? ? ?Thrombocytopenia ([place]) ?--WBC WNL. Platelets low but stable. RUQ with normal appearing liver ?--needs outpatient follow-up ? ? ? ? ?  ? ?Subjective:  ?Feels ok ?Refused prep last night, scared to soil the bed ? ?Physical Exam: ?Vitals:  ? 10/22/21 1100 10/22/21 1410 10/22/21 1421 10/22/21 1430  ?BP: 140/89 (!) 148/81 (!) 144/80 (!) 150/83  ?Pulse: 74     ?Resp: 19 16 16    ?Temp: 98.4 ?F (36.9 ?C) 98 ?F (36.7 ?C)    ?TempSrc: Oral Oral    ?SpO2: 96% 95%    ?Weight:  88.9 kg    ? ?Physical Exam ?Vitals reviewed.  ?Constitutional:   ?   General: He is not in acute distress. ?   Appearance: He is not ill-appearing or toxic-appearing.  ?Cardiovascular:  ?   Rate and Rhythm: Normal rate. Rhythm irregular.  ?   Heart sounds: No murmur heard. ?   Comments: Atrial fibrillation ?Pulmonary:  ?   Effort: Pulmonary effort is normal. No respiratory distress.  ?   Breath sounds: No wheezing, rhonchi or rales.  ?Neurological:  ?   Mental Status: He is alert.  ?Psychiatric:     ?   Mood and Affect: Mood normal.     ?   Behavior: Behavior normal.  ? ? ?Data Reviewed: ? ?BMP noted ?Hgb stable 8.5 ? ?Family Communication: none ? ?Disposition: ?Status is: Inpatient ?Remains inpatient appropriate because: now willing to do bowel prep ? ? Planned Discharge Destination: Home ? ? ? ?Time spent: 25 minutes ? ?Author: ?Murray Hodgkins, MD ?10/22/2021 2:52 PM ? ?  For on call review [email or web address].  ?

## 2021-10-22 NOTE — Op Note (Signed)
[facility] ?Patient Name: John Woodard ?Procedure Date : 10/22/2021 ?MRN: [id number] ?Attending MD: Jerene Bears , MD ?Date of Birth: 19-Mar-1971 ?CSN: [id number] ?Age: 51 ?Admit Type: Inpatient ?Procedure:                Upper GI endoscopy ?Indications:              Acute on chronic anemia, Heme positive stool  ?                          (outpatient, heme negative at hospital admit) ?Providers:                Lajuan Lines. Hilarie Fredrickson, MD, Doristine Johns, RN, Alphonzo Grieve  ?                          Leighton Roach, Technician, Gloris Ham, Technician ?Referring MD:             [facility] ?Medicines:                Monitored Anesthesia Care ?Complications:            No immediate complications. ?Estimated Blood Loss:     Estimated blood loss was minimal. ?Procedure:                Pre-Anesthesia Assessment: ?                          - Prior to the procedure, a History and Physical  ?                          was performed, and patient medications and  ?                          allergies were reviewed. The patient's tolerance of  ?                          previous anesthesia was also reviewed. The risks  ?                          and benefits of the procedure and the sedation  ?                          options and risks were discussed with the patient.  ?                          All questions were answered, and informed consent  ?                          was obtained. Prior Anticoagulants: The patient has  ?                          taken no previous anticoagulant or antiplatelet  ?                          agents. ASA Grade Assessment: III - A patient with  ?  severe systemic disease. After reviewing the risks  ?                          and benefits, the patient was deemed in  ?                          satisfactory condition to undergo the procedure. ?                          After obtaining informed consent, the endoscope was  ?                          passed under  direct vision. Throughout the  ?                          procedure, the patient's blood pressure, pulse, and  ?                          oxygen saturations were monitored continuously. The  ?                          PCF-190TL ([id number]) Olympus colonoscope was  ?                          introduced through the mouth, and advanced to the  ?                          third part of duodenum. The upper GI endoscopy was  ?                          accomplished without difficulty. The patient  ?                          tolerated the procedure well. ?Scope In: ?Scope Out: ?Findings: ?     LA Grade A (one or more mucosal breaks less than 5 mm, not extending  ?     between tops of 2 mucosal folds) esophagitis with no bleeding was found  ?     at the gastroesophageal junction. ?     A 4 cm hiatal hernia was present. ?     The gastroesophageal flap valve was visualized endoscopically and  ?     classified as Hill Grade IV (no fold, wide open lumen, hiatal hernia  ?     present). ?     Multiple 3 to 10 mm pedunculated and sessile polyps with no bleeding  ?     were found in the gastric fundus and in the gastric body. These polyps  ?     appear most consistent with fundic gland polyps. Biopsies were taken  ?     from multiple different polyps with a cold forceps for histology as  ?     representative sampling. ?     The exam of the stomach was otherwise normal. ?     A single 6 mm sessile polyp was found in the second portion of the  ?     duodenum. The polyp was removed with a cold snare.  Resection and  ?     retrieval were complete. ?     The exam of the duodenum was otherwise normal. ?Impression:               - LA Grade A reflux esophagitis with no bleeding. ?                          - 4 cm hiatal hernia. ?                          - Multiple gastric polyps. Benign-appearing.  ?                          Sampling biopsies. ?                          - A single duodenal polyp. Resected and retrieved. ?                           - Examined duodenum otherwise normal. ?Moderate Sedation: ?     N/A ?Recommendation:           - Return patient to hospital ward for ongoing care. ?                          - Resume previous diet. ?                          - Continue present medications. ?                          - Await pathology results. ?                          - Colonoscopy remains recommended in light of  ?                          duodenal adenoma history and heme + stools, though  ?                          patient does not want to perform bowel preparation.  ?                          If colonoscopy is performed and unremarkable, video  ?                          capsule endoscopy would be reasonable to exclude  ?                          additional small bowel pathology. ?                          - Monitor Hgb. ?Procedure Code(s):        --- Professional --- ?                          14431, Esophagogastroduodenoscopy, flexible,  ?  transoral; with removal of tumor(s), polyp(s), or  ?                          other lesion(s) by snare technique ?                          43239, 59, Esophagogastroduodenoscopy, flexible,  ?                          transoral; with biopsy, single or multiple ?Diagnosis Code(s):        --- Professional --- ?                          K44.9, Diaphragmatic hernia without obstruction or  ?                          gangrene ?                          K31.7, Polyp of stomach and duodenum ?                          D62, Acute posthemorrhagic anemia ?                          R19.5, Other fecal abnormalities ?CPT copyright 2019 American Medical Association. All rights reserved. ?The codes documented in this report are preliminary and upon coder review may  ?be revised to meet current compliance requirements. ?Jerene Bears, MD ?10/22/2021 10:45:13 AM ?This report has been signed electronically. ?Number of Addenda: 0 ?

## 2021-10-22 NOTE — Progress Notes (Signed)
?[facility] ?Progress Note  ? ?Subjective:    ?Seen and examined patient on HD. No acute complaints. Spoke with HD RN. Informed of patient refusing to drink the bowel prep overnight; causing to proceed with EGD but not the Colonoscopy. HD tx aborted at this time to complete EGD. Plan to try again and complete full treatment after procedure later this afternoon/evening ? ?Objective ?Vitals:  ? 10/22/21 0809 10/22/21 0902 10/22/21 0915 10/22/21 0929  ?BP: (!) 149/104 (!) 205/89 (!) 205/89 (!) 207/89  ?Pulse: 84     ?Resp: 15 14    ?Temp: 98.6 ?F (37 ?C) (!) 97.2 ?F (36.2 ?C)    ?TempSrc: Oral Oral    ?SpO2: 97%     ?Weight:      ? ?Physical Exam ?General: WDWN male, alert and in NAD, communicates well with white board ?Heart: RRR, no murmurs, rubs or gallops ?Lungs: CTA anteriorly.  ?Abdomen: Soft, non-distended, +BS ?Extremities: no edema bilateral lower extremities ?Dialysis Access: RUE AVF (+) B/T ?  ?Filed Weights  ? 10/20/21 1016 10/20/21 1416 10/22/21 0552  ?Weight: 83 kg 80.3 kg 88.9 kg  ? ? ?Intake/Output Summary (Last 24 hours) at 10/22/2021 0952 ?Last data filed at 10/21/2021 2108 ?Gross per 24 hour  ?Intake 50 ml  ?Output --  ?Net 50 ml  ? ? ?Additional Objective ?Labs: ?Basic Metabolic Panel: ?Recent Labs  ?Lab 10/19/21 ?1150 10/20/21 ?0157 10/22/21 ?0319  ?NA 138 136 136  ?K 4.7 4.6 4.0  ?CL 94* 96* 97*  ?CO2 _0 ?GLUCOSE 96 78 91  ?BUN 51* 61* 48*  ?CREATININE 12.02* 13.48* 13.01*  ?CALCIUM 9.8 9.1 9.7  ?PHOS  --   --  5.9*  ? ?Liver Function Tests: ?Recent Labs  ?Lab 10/19/21 ?1150 10/22/21 ?0319  ?AST 20  --   ?ALT 16  --   ?ALKPHOS 62  --   ?BILITOT 0.1*  --   ?PROT 6.8  --   ?ALBUMIN 3.8 3.5  ? ?No results for input(s): LIPASE, AMYLASE in the last 168 hours. ?CBC: ?Recent Labs  ?Lab 10/19/21 ?1150 10/19/21 ?1937 10/20/21 ?0157 10/21/21 ?9935 10/22/21 ?0319  ?WBC 3.7* 4.1 3.6* 3.6* 5.2  ?NEUTROABS  --   --   --   --  4.0  ?HGB 6.2* 6.6* 6.6* 8.2* 8.5*  ?HCT 19.4* 20.1* 20.5* 25.3*  25.6*  ?MCV 85.8 84.5 83.0 83.8 82.6  ?PLT 89* 84* 75* 90* 75*  ? ?Blood Culture ?   ?Component Value Date/Time  ? [place], CLEAN CATCH 03/19/2007 1550  ? [place] ADDED [id number] 03/19/2007 1550  ? CULT  03/19/2007 1550  ?  Multiple bacterial morphotypes present, none predominant. Suggest appropriate recollection if clinically indicated.  ? REPTSTATUS 03/21/2007 FINAL 03/19/2007 1550  ? ? ?Cardiac Enzymes: ?No results for input(s): CKTOTAL, CKMB, CKMBINDEX, TROPONINI in the last 168 hours. ?CBG: ?Recent Labs  ?Lab 10/21/21 ?0623 10/21/21 ?1135 10/21/21 ?1656 10/21/21 ?2011 10/22/21 ?0092  ?GLUCAP 93 96 66* 88 99  ? ?Iron Studies: No results for input(s): IRON, TIBC, TRANSFERRIN, FERRITIN in the last 72 hours. ?Lab Results  ?Component Value Date  ? INR 1.0 10/21/2021  ? INR 0.98 04/03/2017  ? ?Studies/Results: ?US Abdomen Complete ? ?Result Date: 10/21/2021 ?CLINICAL DATA:  Thrombocytopenia.  End-stage renal disease EXAM: ABDOMEN ULTRASOUND COMPLETE COMPARISON:  Renal ultrasound 12/24/2020 CT 10/01/2017 FINDINGS: Gallbladder: Contracted. No gallbladder wall thickening pericholecystic fluid. No echogenic gallstones identified. Negative sonographic Murphy's sign Common bile duct: Diameter: Normal at 3  mm Liver: No focal lesion identified. Within normal limits in parenchymal echogenicity. Portal vein is patent on color Doppler imaging with normal direction of blood flow towards the liver. IVC: No abnormality visualized. Pancreas: Visualized portion unremarkable. Spleen: Size and appearance within normal limits. Right Kidney: Length: 9.3 cm. Multiple anechoic cysts. Largest cyst measures 3.3 cm. Smaller cysts measuring 1-2 cm. No hydronephrosis Left Kidney: Length: 10.5 cm. Multiple anechoic cysts. Cysts range from 1-3 cm. No hydronephrosis. Abdominal aorta: No aneurysm visualized. Other findings: Small RIGHT pleural effusion IMPRESSION: 1. Contracted gallbladder.  No evidence of cholecystitis. 2. Multiple  bilateral benign-appearing renal cysts. No change from comparison ultrasound. No hydronephrosis. 3. No biliary duct dilatation. 4. Small RIGHT effusion noted. Electronically Signed   By: Suzy Bouchard M.D.   On: 10/21/2021 11:44   ? ?Medications: ? [MAR Hold] sodium chloride    ? [MAR Hold] sodium chloride    ? [MAR Hold] sodium chloride    ? ? [MAR Hold] amLODipine  10 mg Oral QHS  ? [MAR Hold] Chlorhexidine Gluconate Cloth  6 each Topical Q0600  ? [MAR Hold] cinacalcet  30 mg Oral Once per day on Mon Wed Fri  ? [MAR Hold] cloNIDine  0.3 mg Oral TID  ? [MAR Hold] darbepoetin (ARANESP) injection - DIALYSIS  100 mcg Intravenous Q Sun-HD  ? [MAR Hold] doxercalciferol  10 mcg Intravenous Q T,Th,Sa-HD  ? [MAR Hold] furosemide  40 mg Oral Daily  ? [MAR Hold] hydrALAZINE  25 mg Oral BID  ? [MAR Hold] losartan  100 mg Oral Daily  ? [MAR Hold] metoprolol succinate  50 mg Oral QHS  ? [MAR Hold] multivitamin  1 tablet Oral QHS  ? [MAR Hold] pantoprazole  80 mg Oral Q1200  ? [MAR Hold] peg 3350 powder  0.5 kit Oral Once  ? Followed by  ? [MAR Hold] peg 3350 powder  0.5 kit Oral Once  ? [MAR Hold] sevelamer carbonate  1,600 mg Oral TID WC  ? [MAR Hold] sevelamer carbonate  800 mg Oral With snacks  ? [MAR Hold] sodium chloride flush  3 mL Intravenous Q12H  ? ? ?Dialysis Orders: ?[facility]  on TTS . ?180NRe, 4 hr 30 min, BFR 450, DFR Auto 1.5, EDW 79.5kg, 2K, 2Ca, AVF 15g, heparin 2000 unit bolus and 1000 unit bolus mid HD ?Mircera 149mg IV q 2 weeks- last dose 771m on 10/05/21 ?Hectorol 1041mIV q HD ?Sensipar 120m67mO q HD ? ?Assessment/Plan: ?Symptomatic anemia: History of +FOBT outpatient but negative here. Received 2 units PRBC this admit, GI bleed work up per primary team. He is due for ESA and needs dose increased, ordered aranesp with dialysis. Hgb now 8.5-monitor trends closely. ?ESRD:  Attends dialysis on TTS schedule but was sent to ED Saturday without dialysis. Getting make up treatment today  then resume TTS schedule. No heparin. Plan for HD after EGD today. Patient refused bowel prep overnight so unable to perform Colonoscopy today-will need to be rescheduled. ?Hypertension/volume: Patient euvolemic on exam. Has a suprapubic catheter and reports he still makes "a few cups" of urine per day. Bps relatively stable-noted high reading this AM. He is on many outpatient BP meds (amlodipine 10mg61m, clonidine 0.3mg T25m doxazosin 4mg QH51mhydralazine 25mg BI50mosartan 100mg QD,69moprolol ER 50mg QHS)57m is only moderately elevated here without amlodipine, doxazosin or hydralazine. Unclear if he is actually taking all of these medications as he is a poor historian. Follow BP closely.  ?Metabolic  bone disease: Calcium controlled. Continue hectorol, sensipar and binders ?Nutrition:  NPO after midnight to prep for EGD/Colonoscopy  ? ?Tobie Poet, NP ?[facility] ?10/22/2021,9:52 AM ? LOS: 1 day  ?  ?

## 2021-10-22 NOTE — H&P (View-Only) (Signed)
Per Dr. Sarajane Jews, pt now agreeable for colonoscopy tomorrow ?Will proceed with bowel prep as previously order for colonoscopy tomorrow (10/23/21) ? ?

## 2021-10-22 NOTE — Progress Notes (Signed)
Per Dr. Sarajane Jews, pt now agreeable for colonoscopy tomorrow ?Will proceed with bowel prep as previously order for colonoscopy tomorrow (10/23/21) ? ?

## 2021-10-22 NOTE — Transfer of Care (Signed)
Immediate Anesthesia Transfer of Care Note ? ?Patient: John Woodard ? ?Procedure(s) Performed: ESOPHAGOGASTRODUODENOSCOPY (EGD) WITH PROPOFOL ?POLYPECTOMY ?BIOPSY ? ?Patient Location: PACU ? ?Anesthesia Type:MAC ? ?Level of Consciousness: awake and drowsy ? ?Airway & Oxygen Therapy: Patient Spontanous Breathing and Patient connected to nasal cannula oxygen ? ?Post-op Assessment: Report given to RN and Post -op Vital signs reviewed and stable ? ?Post vital signs: Reviewed and stable ? ?Last Vitals:  ?Vitals Value Taken Time  ?BP 129/59 10/22/21 1022  ?Temp    ?Pulse 87 10/22/21 1024  ?Resp 20 10/22/21 1024  ?SpO2 94 % 10/22/21 1024  ?Vitals shown include unvalidated device data. ? ?Last Pain:  ?Vitals:  ? 10/22/21 0902  ?TempSrc: Oral  ?PainSc: 0-No pain  ?   ? ?  ? ?Complications: No notable events documented. ?

## 2021-10-22 NOTE — Anesthesia Postprocedure Evaluation (Signed)
Anesthesia Post Note ? ?Patient: John Woodard ? ?Procedure(s) Performed: ESOPHAGOGASTRODUODENOSCOPY (EGD) WITH PROPOFOL ?POLYPECTOMY ?BIOPSY ? ?  ? ?Patient location during evaluation: Endoscopy ?Anesthesia Type: MAC ?Level of consciousness: awake ?Pain management: pain level controlled ?Vital Signs Assessment: post-procedure vital signs reviewed and stable ?Respiratory status: spontaneous breathing ?Cardiovascular status: stable ?Postop Assessment: no apparent nausea or vomiting ?Anesthetic complications: no ? ? ?No notable events documented. ? ?Last Vitals:  ?Vitals:  ? 10/22/21 1039 10/22/21 1100  ?BP: (!) 150/73 140/89  ?Pulse: 73 74  ?Resp: 15 19  ?Temp:  36.9 ?C  ?SpO2: 97% 96%  ?  ?Last Pain:  ?Vitals:  ? 10/22/21 1100  ?TempSrc: Oral  ?PainSc:   ? ? ?  ?  ?  ?  ?  ?  ? ?[name] ? ? ? ? ?

## 2021-10-22 NOTE — Procedures (Addendum)
I spoke to the patient's brother John Woodard after the procedure by phone. ?I explained the findings of the upper endoscopy ?We also discussed that the patient does not wish to proceed with colonoscopy or complete bowel preparation. ?The patient's brother suggested that we write down I recommendation because he comprehends written word to better than spoken word.  I will do so.  I explained that colonoscopy requires bowel preparation and thus, if he is unwilling to prep then we will unable to perform colonoscopy. ? ?Time provided for questions and answers and he thanked me for the call ?

## 2021-10-22 NOTE — Progress Notes (Signed)
Patient has a strong foul, malodorous odor coming from possibly supra catheter site?? Area around entrance site, clean, dry, no discharge or redness.  ? ?Per patient, it gets changed every month.  ? ? ? ? ?

## 2021-10-23 ENCOUNTER — Inpatient Hospital Stay (HOSPITAL_COMMUNITY): Payer: Medicare Other | Admitting: Certified Registered Nurse Anesthetist

## 2021-10-23 ENCOUNTER — Ambulatory Visit: Payer: Medicare Other | Admitting: Physician Assistant

## 2021-10-23 ENCOUNTER — Encounter (HOSPITAL_COMMUNITY)
Admission: EM | Disposition: A | Discharge status: Home / Self Care | Payer: Self-pay | Source: Home / Self Care | Attending: Family Medicine

## 2021-10-23 ENCOUNTER — Encounter (HOSPITAL_COMMUNITY): Payer: Self-pay | Admitting: Family Medicine

## 2021-10-23 DIAGNOSIS — Z992 Dependence on renal dialysis: Secondary | ICD-10-CM

## 2021-10-23 DIAGNOSIS — R195 Other fecal abnormalities: Secondary | ICD-10-CM

## 2021-10-23 DIAGNOSIS — D649 Anemia, unspecified: Secondary | ICD-10-CM | POA: Diagnosis not present

## 2021-10-23 DIAGNOSIS — E1122 Type 2 diabetes mellitus with diabetic chronic kidney disease: Secondary | ICD-10-CM

## 2021-10-23 DIAGNOSIS — I120 Hypertensive chronic kidney disease with stage 5 chronic kidney disease or end stage renal disease: Secondary | ICD-10-CM

## 2021-10-23 DIAGNOSIS — N186 End stage renal disease: Secondary | ICD-10-CM

## 2021-10-23 DIAGNOSIS — D62 Acute posthemorrhagic anemia: Secondary | ICD-10-CM

## 2021-10-23 HISTORY — PX: COLONOSCOPY WITH PROPOFOL: SHX5780

## 2021-10-23 LAB — POCT I-STAT, CHEM 8
BUN: 37 mg/dL — ABNORMAL HIGH (ref 6–20)
BUN: 37 mg/dL — ABNORMAL HIGH (ref 6–20)
BUN: 25 mg/dL — ABNORMAL HIGH (ref 6–20)
Calcium, Ion: 1.15 mmol/L (ref 1.15–1.40)
Calcium, Ion: 1.18 mmol/L (ref 1.15–1.40)
Calcium, Ion: 1.3 mmol/L (ref 1.15–1.40)
Chloride: 100 mmol/L (ref 98–111)
Chloride: 101 mmol/L (ref 98–111)
Chloride: 99 mmol/L (ref 98–111)
Creatinine, Ser: 10.4 mg/dL — ABNORMAL HIGH (ref 0.61–1.24)
Creatinine, Ser: 10.8 mg/dL — ABNORMAL HIGH (ref 0.61–1.24)
Creatinine, Ser: 10.5 mg/dL — ABNORMAL HIGH (ref 0.61–1.24)
Glucose, Bld: 86 mg/dL (ref 70–99)
Glucose, Bld: 87 mg/dL (ref 70–99)
Glucose, Bld: 85 mg/dL (ref 70–99)
HCT: 29 % — ABNORMAL LOW (ref 39.0–52.0)
HCT: 30 % — ABNORMAL LOW (ref 39.0–52.0)
HCT: 32 % — ABNORMAL LOW (ref 39.0–52.0)
Hemoglobin: 10.2 g/dL — ABNORMAL LOW (ref 13.0–17.0)
Hemoglobin: 9.9 g/dL — ABNORMAL LOW (ref 13.0–17.0)
Hemoglobin: 10.9 g/dL — ABNORMAL LOW (ref 13.0–17.0)
Potassium: 7.8 mmol/L (ref 3.5–5.1)
Potassium: 7.8 mmol/L (ref 3.5–5.1)
Potassium: 4.5 mmol/L (ref 3.5–5.1)
Sodium: 133 mmol/L — ABNORMAL LOW (ref 135–145)
Sodium: 134 mmol/L — ABNORMAL LOW (ref 135–145)
Sodium: 138 mmol/L (ref 135–145)
TCO2: 29 mmol/L (ref 22–32)
TCO2: 30 mmol/L (ref 22–32)
TCO2: 28 mmol/L (ref 22–32)

## 2021-10-23 LAB — IRON AND TIBC
Iron: 108 ug/dL (ref 45–182)
Saturation Ratios: 40 % — ABNORMAL HIGH (ref 17.9–39.5)
TIBC: 269 ug/dL (ref 250–450)
UIBC: 161 ug/dL

## 2021-10-23 LAB — RETICULOCYTES
Immature Retic Fract: 32 % — ABNORMAL HIGH (ref 2.3–15.9)
RBC.: 3.6 MIL/uL — ABNORMAL LOW (ref 4.22–5.81)
Retic Count, Absolute: 55.8 10*3/uL (ref 19.0–186.0)
Retic Ct Pct: 1.6 % (ref 0.4–3.1)

## 2021-10-23 LAB — GLUCOSE, CAPILLARY
Glucose-Capillary: 75 mg/dL (ref 70–99)
Glucose-Capillary: 56 mg/dL — ABNORMAL LOW (ref 70–99)
Glucose-Capillary: 87 mg/dL (ref 70–99)

## 2021-10-23 LAB — FERRITIN: Ferritin: 1322 ng/mL — ABNORMAL HIGH (ref 24–336)

## 2021-10-23 LAB — FOLATE: Folate: 61.8 ng/mL (ref >5.9)

## 2021-10-23 LAB — VITAMIN B12: Vitamin B-12: 989 pg/mL — ABNORMAL HIGH (ref 180–914)

## 2021-10-23 LAB — SURGICAL PATHOLOGY

## 2021-10-23 SURGERY — COLONOSCOPY WITH PROPOFOL
Anesthesia: Monitor Anesthesia Care

## 2021-10-23 MED ORDER — PROPOFOL 500 MG/50ML IV EMUL
INTRAVENOUS | Status: DC | PRN
  Administered 2021-10-23 (×2): 150 ug/kg/min via INTRAVENOUS

## 2021-10-23 MED ORDER — SODIUM CHLORIDE 0.9 % IV SOLN: INTRAVENOUS | Status: DC | PRN

## 2021-10-23 MED ORDER — FLEET ENEMA 7-19 GM/118ML RE ENEM
ENEMA | RECTAL | Status: AC
  Filled 2021-10-23: qty 1

## 2021-10-23 MED ORDER — PROPOFOL 10 MG/ML IV BOLUS
INTRAVENOUS | Status: DC | PRN
  Administered 2021-10-23: 40 mg via INTRAVENOUS

## 2021-10-23 MED ORDER — LIDOCAINE 2% (20 MG/ML) 5 ML SYRINGE
INTRAMUSCULAR | Status: DC | PRN
  Administered 2021-10-23: 40 mg via INTRAVENOUS

## 2021-10-23 SURGICAL SUPPLY — 22 items

## 2021-10-23 NOTE — Plan of Care (Signed)
  Problem: Activity: Goal: Ability to tolerate increased activity will improve Outcome: Progressing   

## 2021-10-23 NOTE — Anesthesia Preprocedure Evaluation (Addendum)
Anesthesia Evaluation  ?Patient identified by MRN, date of birth, ID band ?Patient awake ? ? ? ?Reviewed: ?Allergy & Precautions, NPO status , Patient's Chart, lab work & pertinent test results, reviewed documented beta blocker date and time  ? ?Airway ?Mallampati: I ? ?TM Distance: >3 FB ?Neck ROM: Full ? ? ? Dental ?no notable dental hx. ?(+) Teeth Intact, Dental Advisory Given ?  ?Pulmonary ?neg pulmonary ROS,  ?  ?Pulmonary exam normal ?breath sounds clear to auscultation ? ? ? ? ? ? Cardiovascular ?hypertension, Pt. on medications and Pt. on home beta blockers ?Normal cardiovascular exam+ dysrhythmias Atrial Fibrillation  ?Rhythm:Regular Rate:Normal ? ? ?  ?Neuro/Psych ?PSYCHIATRIC DISORDERS Depression negative neurological ROS ?   ? GI/Hepatic ?Neg liver ROS, GERD  ,  ?Endo/Other  ?diabetes, Type 2 ? Renal/GU ?ESRF and DialysisRenal disease (dialysis TTS)  ?negative genitourinary ?  ?Musculoskeletal ?negative musculoskeletal ROS ?(+)  ? Abdominal ?  ?Peds ? Hematology ? ?(+) Blood dyscrasia, anemia , Lab Results ?     Component                Value               Date                 ?     WBC                      5.2                 10/22/2021           ?     HGB                      8.5 (L)             10/22/2021           ?     HCT                      25.6 (L)            10/22/2021           ?     MCV                      82.6                10/22/2021           ?     PLT                      75 (L)              10/22/2021           ?   ?Anesthesia Other Findings ? ? Reproductive/Obstetrics ? ?  ? ? ? ? ? ? ? ? ? ? ? ? ? ?  ?  ? ? ? ? ? ? ? ?Anesthesia Physical ?Anesthesia Plan ? ?ASA: 3 ? ?Anesthesia Plan: MAC  ? ?Post-op Pain Management:   ? ?Induction: Intravenous ? ?PONV Risk Score and Plan: Propofol infusion and Treatment may vary due to age or medical condition ? ?Airway Management Planned: Natural Airway ? ?Additional Equipment:  ? ?Intra-op Plan:  ? ?Post-operative  Plan:  ? ?Informed Consent: I have reviewed the patients History and Physical, chart, labs and discussed the procedure  including the risks, benefits and alternatives for the proposed anesthesia with the patient or authorized representative who has indicated his/her understanding and acceptance.  ? ? ? ?Dental advisory given ? ?Plan Discussed with: CRNA ? ?Anesthesia Plan Comments:   ? ? ? ? ? ? ?Anesthesia Quick Evaluation ? ?

## 2021-10-23 NOTE — Discharge Summary (Signed)
?Physician Discharge Summary ?  ?Patient: John Woodard MRN: [id number] DOB: August 13, 1970  ?Admit date:     10/19/2021  ?Discharge date: 10/23/21  ?Discharge Physician: Geradine Girt  ? ?PCP: Janith Lima, MD  ? ?Recommendations at discharge:  ? ? Resume HD on 4/6 ?Cbc 1 week ?Ambulatory referral to a fib ? ?Discharge Diagnoses: ?Principal Problem: ?  Acute on chronic symptomatic anemia  ?Active Problems: ?  ESRD on dialysis [facility]) ?  PAF (paroxysmal atrial fibrillation) ([place]) ?  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  Hypertension ?  Neurogenic bladder ?  GERD (gastroesophageal reflux disease) ?  Intellectual disability ?  Thrombocytopenia ([place]) ?  Symptomatic anemia ?  Gastric polyps ?  Polyp of duodenum ? ?Resolved Problems: ?  Symptomatic anemia ? ?Hospital Course: ?51 year old man PMH ESRD on dialysis TTS, multiple other medical problems; intellectual disability, found to have low hemoglobin at dialysis and sent to the emergency department.  Recent symptoms including dyspnea on exertion and fatigue.  No definite known bleeding.  Fecal occult negative in the emergency department.  Admitted for treatment of acute on chronic symptomatic anemia. Hgb up appropriately after 2 units PRBC. EGD unrevealing; patient initially refused bowel prep, but now agreeable, plans for lower endoscopy 4/5. ? ?Assessment and Plan: ?* Acute on chronic symptomatic anemia  ?-- with associated shortness of breath and fatigue, fecal occult negative with no history of N/V/abdominal pain or black/tarry or bloody stools. Has a +FOBT done at dialysis on 10/05/21.  ?-- s/p 3 unit RBC without improvement, Hgb stable ?-- EGD unrevealing; s/p colonoscopy ?-if further bleeding needs capsule ? ? ? ?ESRD on dialysis [facility]) ?-- HD as per nephrology ?-- stable ? ?PAF (paroxysmal atrial fibrillation) ([place]) ?-- continue Toprol-XL. Consider DOAC r/b in  future. Defer to outpatient setting ?-- PCP planned referral to cardiology; will send ambulatory referral ? 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 A1c 4.3 in 08/2020 ? ? ?Hypertension ?-- Blood pressure better controlled today ?--On multiple agents outpatient, reportedly: metoprolol, losartan, clonidine, Norvasc, cardura, hydralazine, Lasix ?--continue clonidine, Lasix, losartan, metoprolol, hydralazine ? ?Neurogenic bladder ?--Has had suprapubic catheter since 2003 ?--Catheter care  ?--follow-up as an outpatient ? ?GERD (gastroesophageal reflux disease) ?--Continue PPI  ? ?Intellectual disability ?-- At baseline per brother. Is hard of hearing; does best responding with communication via written word ? ? ?Thrombocytopenia ([place]) ?--WBC WNL. Platelets low but stable. RUQ with normal appearing liver ?--needs outpatient follow-up ? ? ? ?Consultants: renal, GI ? ?Disposition: Home ?Diet recommendation:  ?Renal diet ? ?DISCHARGE MEDICATION: ?Allergies as of 10/23/2021   ? ?   Reactions  ? Naproxen Anaphylaxis, Other (See Comments)  ? CARDIAC ARREST- "heart stopped one time"  ? Lactose Intolerance (gi) Diarrhea, Other (See Comments)  ? BLOODY STOOLS  ? Penicillins Other (See Comments)  ? UNSPECIFIED REACTION   ? ?  ? ?  ?Medication List  ?  ? ?STOP taking these medications   ? ?acetaminophen 325 MG tablet ?Commonly known as: TYLENOL ?  ?docusate sodium 100 MG capsule ?Commonly known as: COLACE ?  ?doxazosin 4 MG tablet ?Commonly known as: CARDURA ?  ?heparin 1000 unit/mL Soln injection ?  ?HYDROcodone-acetaminophen 5-325 MG tablet ?Commonly known as: [place] ?  ?MIRCERA IJ ?  ?polyethylene glycol powder 17 GM/SCOOP powder ?Commonly known as: GLYCOLAX/MIRALAX ?  ? ?  ? ?  TAKE these medications   ? ?amLODipine 5 MG tablet ?Commonly known as: NORVASC ?Take 10 mg by mouth at bedtime. ?  ?cinacalcet 30 MG  tablet ?Commonly known as: SENSIPAR ?Take 30 mg by mouth 3 (three) times a week. Tues, Thurs, Sat ?  ?cloNIDine 0.3 MG tablet ?Commonly known as: CATAPRES ?Take 1 tablet (0.3 mg total) by mouth 3 (three) times daily. ?  ?esomeprazole 40 MG capsule ?Commonly known as: [place] ?Take 40 mg by mouth daily before breakfast. ?  ?furosemide 40 MG tablet ?Commonly known as: LASIX ?Take 40 mg by mouth daily. ?  ?hydrALAZINE 25 MG tablet ?Commonly known as: APRESOLINE ?Take 25 mg by mouth 2 (two) times daily. ?  ?losartan 100 MG tablet ?Commonly known as: COZAAR ?Take 100 mg by mouth daily. ?  ?metoprolol succinate 50 MG 24 hr tablet ?Commonly known as: TOPROL-XL ?Take 1 tablet (50 mg total) by mouth at bedtime. Take with or immediately following a meal. ?  ?multivitamin Tabs tablet ?Take 1 tablet by mouth at bedtime. ?  ?sevelamer carbonate 800 MG tablet ?Commonly known as: RENVELA ?Take 800-1,600 mg by mouth in the morning, at noon, in the evening, and at bedtime. Take 2 tablets (1600 mg) three times daily with meals and 1 tablet (800 mg) with one snack. ?  ? ?  ? ? Follow-up Information   ? ? Janith Lima, MD Follow up in 1 week(s).   ?Specialty: Internal Medicine ?Contact information: ?[place] [address] ?[phone] ? ? ?  ?  ? ?  ?  ? ?  ? ?Discharge Exam: ?Filed Weights  ? 10/22/21 1410 10/22/21 1753 10/23/21 0522  ?Weight: 88.9 kg 85.9 kg 76.9 kg  ? ? ? ?Condition at discharge: good ? ?The results of significant diagnostics from this hospitalization (including imaging, microbiology, ancillary and laboratory) are listed below for reference.  ? ?Imaging Studies: ?US Abdomen Complete ? ?Result Date: 10/21/2021 ?CLINICAL DATA:  Thrombocytopenia.  End-stage renal disease EXAM: ABDOMEN ULTRASOUND COMPLETE COMPARISON:  Renal ultrasound 12/24/2020 CT 10/01/2017 FINDINGS: Gallbladder: Contracted. No gallbladder wall thickening pericholecystic fluid. No echogenic gallstones identified. Negative  sonographic Murphy's sign Common bile duct: Diameter: Normal at 3 mm Liver: No focal lesion identified. Within normal limits in parenchymal echogenicity. Portal vein is patent on color Doppler imaging with normal direction of blood flow towards the liver. IVC: No abnormality visualized. Pancreas: Visualized portion unremarkable. Spleen: Size and appearance within normal limits. Right Kidney: Length: 9.3 cm. Multiple anechoic cysts. Largest cyst measures 3.3 cm. Smaller cysts measuring 1-2 cm. No hydronephrosis Left Kidney: Length: 10.5 cm. Multiple anechoic cysts. Cysts range from 1-3 cm. No hydronephrosis. Abdominal aorta: No aneurysm visualized. Other findings: Small RIGHT pleural effusion IMPRESSION: 1. Contracted gallbladder.  No evidence of cholecystitis. 2. Multiple bilateral benign-appearing renal cysts. No change from comparison ultrasound. No hydronephrosis. 3. No biliary duct dilatation. 4. Small RIGHT effusion noted. Electronically Signed   By: Suzy Bouchard M.D.   On: 10/21/2021 11:44   ? ?Microbiology: ?Results for orders placed or performed during the hospital encounter of 10/19/21  ?Resp Panel by RT-PCR (Flu A&B, Covid) Nasopharyngeal Swab     Status: None  ? Collection Time: 10/19/21  1:28 PM  ? Specimen: Nasopharyngeal Swab; Nasopharyngeal(NP) swabs in vial transport medium  ?Result Value Ref Range Status  ? SARS Coronavirus 2 by RT PCR NEGATIVE NEGATIVE Final  ?  Comment: (NOTE) ?SARS-CoV-2 target nucleic acids are NOT DETECTED. ? ?The SARS-CoV-2 RNA is generally  detectable in upper respiratory ?specimens during the acute phase of infection. The lowest ?concentration of SARS-CoV-2 viral copies this assay can detect is ?138 copies/mL. A negative result does not preclude SARS-Cov-2 ?infection and should not be used as the sole basis for treatment or ?other patient management decisions. A negative result may occur with  ?improper specimen collection/handling, submission of specimen other ?than  nasopharyngeal swab, presence of viral mutation(s) within the ?areas targeted by this assay, and inadequate number of viral ?copies(<138 copies/mL). A negative result must be combined with ?clinical observations, patient histor

## 2021-10-23 NOTE — TOC Transition Note (Signed)
Transition of Care (TOC) - CM/SW Discharge Note ?Marvetta Gibbons Therapist, sports, BSN ?Transitions of Care ?Unit 4E- RN Case Manager ?See Treatment Team for direct phone #  ? ? ?Patient Details  ?Name: John Woodard ?MRN: [id number] ?Date of Birth: 1970/08/04 ? ?Transition of Care (TOC) CM/SW Contact:  ?Dahlia Client, Romeo Rabon, RN ?Phone Number: ?10/23/2021, 3:01 PM ? ? ?Clinical Narrative:    ?Pt stable for transition home today, Transition of Care Department [facility]) has reviewed patient and no TOC needs have been identified at this time.  ? ?Final next level of care: Home/Self Care ?Barriers to Discharge: No Barriers Identified ? ? ?Patient Goals and CMS Choice ?  ?  ?Choice offered to / list presented to : NA ? ?Discharge Placement ?  ?           ? Home ?  ?  ?  ? ?Discharge Plan and Services ?  ?  ?           ?DME Arranged: N/A ?DME Agency: NA ?  ?  ?  ?HH Arranged: NA ?[place] Agency: NA ?  ?  ?  ? ?Social Determinants of Health (SDOH) Interventions ?  ? ? ?Readmission Risk Interventions ? ?  10/23/2021  ?  3:01 PM  ?Readmission Risk Prevention Plan  ?Transportation Screening Complete  ?PCP or Specialist Appt within 5-7 Days Complete  ?Home Care Screening Complete  ?Medication Review (RN CM) Complete  ? ? ? ? ? ?

## 2021-10-23 NOTE — Progress Notes (Signed)
D/C order noted. Contacted [facility] and spoke to [place], [place] manager. Clinic advised of pt's d/c today and that pt should resume tomorrow.  ? ?Melven Sartorius ?Renal Navigator ?[phone] ?

## 2021-10-23 NOTE — Transfer of Care (Signed)
Immediate Anesthesia Transfer of Care Note ? ?Patient: John Woodard ? ?Procedure(s) Performed: COLONOSCOPY WITH PROPOFOL ? ?Patient Location: PACU ? ?Anesthesia Type:MAC ? ?Level of Consciousness: drowsy and responds to stimulation ? ?Airway & Oxygen Therapy: Patient Spontanous Breathing and Patient connected to face mask oxygen ? ?Post-op Assessment: Report given to RN and Post -op Vital signs reviewed and stable ? ?Post vital signs: Reviewed and stable ? ?Last Vitals:  ?Vitals Value Taken Time  ?BP 148/84 10/23/21 1218  ?Temp    ?Pulse 79 10/23/21 1219  ?Resp 16 10/23/21 1219  ?SpO2 100 % 10/23/21 1219  ?Vitals shown include unvalidated device data. ? ?Last Pain:  ?Vitals:  ? 10/23/21 1038  ?TempSrc: Temporal  ?PainSc: 0-No pain  ?   ? ?  ? ?Complications: No notable events documented. ?

## 2021-10-23 NOTE — Evaluation (Signed)
Physical Therapy Evaluation ?Patient Details ?Name: John Woodard ?MRN: [id number] ?DOB: Feb 11, 1971 ?Today's Date: 10/23/2021 ? ?History of Present Illness ? Pt is a 51 y.o. M who presents 10/19/2021 with low hemoglobin at dialysis. Admitted with acute on chronic sympatomic anemia. Fecal occult negative. S/p 3 unit RBC without improvement, Hgb stable. Lower endoscopy planned 4/5. Significant PMH: ESRD, intellectual disability.  ?Clinical Impression ? PTA, pt lives with his brother/sister, uses a cane for mobility and is independent with ADL's. Pt appears to be close to his functional baseline. Ambulating 350 ft with no assistive device and negotiated a half flight of stairs without physical difficulty. Pt denies dizziness/lightheadedness. No further acute PT needs. Thank you for this consult. ?   ? ?Recommendations for follow up therapy are one component of a multi-disciplinary discharge planning process, led by the attending physician.  Recommendations may be updated based on patient status, additional functional criteria and insurance authorization. ? ?Follow Up Recommendations No PT follow up ? ?  ?Assistance Recommended at Discharge PRN  ?Patient can return home with the following ? Assist for transportation ? ?  ?Equipment Recommendations None recommended by PT  ?Recommendations for Other Services ?    ?  ?Functional Status Assessment Patient has had a recent decline in their functional status and demonstrates the ability to make significant improvements in function in a reasonable and predictable amount of time.  ? ?  ?Precautions / Restrictions Precautions ?Precautions: Fall ?Restrictions ?Weight Bearing Restrictions: No  ? ?  ? ?Mobility ? Bed Mobility ?Overal bed mobility: Modified Independent ?  ?  ?  ?  ?  ?  ?  ?  ? ?Transfers ?Overall transfer level: Independent ?Equipment used: None ?  ?  ?  ?  ?  ?  ?  ?  ?  ? ?Ambulation/Gait ?Ambulation/Gait assistance: Modified independent (Device/Increase time) ?Gait  Distance (Feet): 350 Feet ?Assistive device: None ?Gait Pattern/deviations: Step-through pattern, Decreased stride length, Scissoring ?Gait velocity: decreased ?  ?  ?General Gait Details: Intermittent scissoring and narrow BOS, but no gross imbalance and likely baseline. Decreased reciprocal arm swing ? ?Stairs ?Stairs: Yes ?Stairs assistance: Modified independent (Device/Increase time) ?Stair Management: One rail Left ?Number of Stairs: 12 ?General stair comments: step over step pattern ? ?Wheelchair Mobility ?  ? ?Modified Rankin (Stroke Patients Only) ?  ? ?  ? ?Balance Overall balance assessment: Mild deficits observed, not formally tested ?  ?  ?  ?  ?  ?  ?  ?  ?  ?  ?  ?  ?  ?  ?  ?  ?  ?  ?   ? ? ? ?Pertinent Vitals/Pain Pain Assessment ?Pain Assessment: No/denies pain  ? ? ?Home Living Family/patient expects to be discharged to:: Private residence ?Living Arrangements: Other relatives (brother, sister) ?Available Help at Discharge: Family ?Type of Home: House ?Home Access: Stairs to enter ?  ?Entrance Stairs-Number of Steps: 10 (10-12) ?  ?  ?Home Equipment: Conservation officer, nature (2 wheels);Cane - single point ?   ?  ?Prior Function Prior Level of Function : Independent/Modified Independent ?  ?  ?  ?  ?  ?  ?Mobility Comments: uses cane; [place] for dialysis, brother can drive in emergencies. ?ADLs Comments: Pt brother takes him to [place], pt can do all IADL's ?  ? ? ?Hand Dominance  ?   ? ?  ?Extremity/Trunk Assessment  ? Upper Extremity Assessment ?Upper Extremity Assessment: Overall WFL for tasks assessed ?  ? ?  Lower Extremity Assessment ?Lower Extremity Assessment: Overall WFL for tasks assessed ?  ? ?Cervical / Trunk Assessment ?Cervical / Trunk Assessment: Normal  ?Communication  ? Communication: HOH;Other (comment) (uses communication board for comprehension)  ?Cognition   ?Behavior During Therapy: [facility] for tasks assessed/performed ?Overall Cognitive Status: History of cognitive impairments - at  baseline ?  ?  ?  ?  ?  ?  ?  ?  ?  ?  ?  ?  ?  ?  ?  ?  ?General Comments: Hx of intellectual disability. Pt responding appropriately when PT writing questions ?  ?  ? ?  ?General Comments   ? ?  ?Exercises    ? ?Assessment/Plan  ?  ?PT Assessment Patient does not need any further PT services  ?PT Problem List   ? ?   ?  ?PT Treatment Interventions     ? ?PT Goals (Current goals can be found in the Care Plan section)  ?Acute Rehab PT Goals ?Patient Stated Goal: did not state ?PT Goal Formulation: All assessment and education complete, DC therapy ? ?  ?Frequency   ?  ? ? ?Co-evaluation   ?  ?  ?  ?  ? ? ?  ?AM-PAC PT "6 Clicks" Mobility  ?Outcome Measure Help needed turning from your back to your side while in a flat bed without using bedrails?: None ?Help needed moving from lying on your back to sitting on the side of a flat bed without using bedrails?: None ?Help needed moving to and from a bed to a chair (including a wheelchair)?: None ?Help needed standing up from a chair using your arms (e.g., wheelchair or bedside chair)?: None ?Help needed to walk in hospital room?: None ?Help needed climbing 3-5 steps with a railing? : None ?6 Click Score: 24 ? ?  ?End of Session   ?Activity Tolerance: Patient tolerated treatment well ?Patient left: in chair;with call bell/phone within reach ?Nurse Communication: Mobility status ?PT Visit Diagnosis: Difficulty in walking, not elsewhere classified (R26.2) ?  ? ?Time: 6144-3154 ?PT Time Calculation (min) (ACUTE ONLY): 13 min ? ? ?Charges:   PT Evaluation ?$PT Eval Low Complexity: 1 Low ?  ?  ?   ? ? ?Wyona Almas, PT, DPT ?Acute Rehabilitation Services ?Pager [phone] ?Office [phone] ? ? ?Deno Etienne ?10/23/2021, 11:49 AM ? ?

## 2021-10-23 NOTE — Op Note (Signed)
[facility] ?Patient Name: John Woodard ?Procedure Date : 10/23/2021 ?MRN: [id number] ?Attending MD: Jerene Bears , MD ?Date of Birth: 12/28/1970 ?CSN: [id number] ?Age: 51 ?Admit Type: Inpatient ?Procedure:                Colonoscopy ?Indications:              Heme positive stool, Acute on chronic anemia,  ?                          personal hx of duodenal adenoma ?Providers:                Lajuan Lines. Hilarie Fredrickson, MD, Allayne Gitelman, RN, Luan Moore,  ?                          Technician, Production assistant, radio, CRNA ?Referring MD:             [facility] ?Medicines:                Monitored Anesthesia Care ?Complications:            No immediate complications. ?Estimated Blood Loss:     Estimated blood loss: none. ?Procedure:                Pre-Anesthesia Assessment: ?                          - Prior to the procedure, a History and Physical  ?                          was performed, and patient medications and  ?                          allergies were reviewed. The patient's tolerance of  ?                          previous anesthesia was also reviewed. The risks  ?                          and benefits of the procedure and the sedation  ?                          options and risks were discussed with the patient.  ?                          All questions were answered, and informed consent  ?                          was obtained. Prior Anticoagulants: The patient has  ?                          taken no previous anticoagulant or antiplatelet  ?                          agents. ASA Grade Assessment: III - A patient with  ?  severe systemic disease. After reviewing the risks  ?                          and benefits, the patient was deemed in  ?                          satisfactory condition to undergo the procedure. ?                          After obtaining informed consent, the colonoscope  ?                          was passed under direct vision. Throughout the  ?                           procedure, the patient's blood pressure, pulse, and  ?                          oxygen saturations were monitored continuously. The  ?                          CF-HQ190L ([id number] Olympus colonoscope was  ?                          introduced through the anus and advanced to the  ?                          terminal ileum. The colonoscopy was performed  ?                          without difficulty. The patient tolerated the  ?                          procedure well. The quality of the bowel  ?                          preparation was excellent. The terminal ileum,  ?                          ileocecal valve, appendiceal orifice, and rectum  ?                          were photographed. ?Scope In: 11:52:36 AM ?Scope Out: 12:12:23 PM ?Scope Withdrawal Time: 0 hours 14 minutes 38 seconds  ?Total Procedure Duration: 0 hours 19 minutes 47 seconds  ?Findings: ?     The digital rectal exam was normal. ?     The terminal ileum appeared normal. ?     The entire examined colon appeared normal on direct and retroflexion  ?     views. ?Impression:               - The examined portion of the ileum was normal. ?                          - The entire examined colon is normal on  direct and  ?                          retroflexion views. ?                          - No specimens collected. ?Moderate Sedation: ?     N/A ?Recommendation:           - Return patient to hospital ward for ongoing care. ?                          - Resume previous diet. ?                          - Continue present medications. ?                          - If recurrent acute anemia, overt GI bleeding then  ?                          video capsule endoscopy is recommended. ?                          - Repeat colonoscopy in 10 years for screening  ?                          purposes. ?                          - GI inpatient team will sign off. Please call if  ?                          questions. ?Procedure Code(s):        --- Professional  --- ?                          [phone], Colonoscopy, flexible; diagnostic, including  ?                          collection of specimen(s) by brushing or washing,  ?                          when performed (separate procedure) ?Diagnosis Code(s):        --- Professional --- ?                          R19.5, Other fecal abnormalities ?                          D62, Acute posthemorrhagic anemia ?CPT copyright 2019 [employer]. All rights reserved. ?The codes documented in this report are preliminary and upon coder review may  ?be revised to meet current compliance requirements. ?Jerene Bears, MD ?10/23/2021 12:18:34 PM ?This report has been signed electronically. ?Number of Addenda: 0 ?

## 2021-10-23 NOTE — Anesthesia Postprocedure Evaluation (Signed)
Anesthesia Post Note ? ?Patient: John Woodard ? ?Procedure(s) Performed: COLONOSCOPY WITH PROPOFOL ? ?  ? ?Patient location during evaluation: PACU ?Anesthesia Type: MAC ?Level of consciousness: awake ?Pain management: pain level controlled ?Respiratory status: spontaneous breathing ?Cardiovascular status: stable ?Postop Assessment: no apparent nausea or vomiting ?Anesthetic complications: no ? ? ?No notable events documented. ? ?Last Vitals:  ?Vitals:  ? 10/23/21 1038 10/23/21 1218  ?BP: (!) 171/79 (!) 148/84  ?Pulse: 70 75  ?Resp: 15 19  ?Temp: 36.7 ?C 36.7 ?C  ?SpO2: 100% 100%  ?  ?Last Pain:  ?Vitals:  ? 10/23/21 1218  ?TempSrc:   ?PainSc: Asleep  ? ? ?  ?  ?  ?  ?  ?  ? ?Aydee Mcnew ? ? ? ? ?

## 2021-10-23 NOTE — Anesthesia Procedure Notes (Signed)
Procedure Name: [place] ?Date/Time: 10/23/2021 11:46 AM ?Performed by: Reece Agar, CRNA ?Pre-anesthesia Checklist: Patient identified, Emergency Drugs available, Suction available and Patient being monitored ?Patient Re-evaluated:Patient Re-evaluated prior to induction ?Oxygen Delivery Method: Simple face mask ? ? ? ? ?

## 2021-10-23 NOTE — Interval H&P Note (Signed)
History and Physical Interval Note: ?Colonoscopy today with MAC to further eval acute on chronic anemia with heme positive stool, personal history of duodenal adenomatous polyps. ? ?Enema performed in endoscopy prior to procedure which is clear and thus we will proceed. ? ?The nature of the procedure, as well as the risks, benefits, and alternatives were carefully and thoroughly reviewed with the patient and his brother [name]. Ample time for discussion and questions allowed. The patient and his brother understood, was satisfied, and agreed to proceed.  ? ? ?10/23/2021 ?11:08 AM ? ?Laquan Beier  has presented today for surgery, with the diagnosis of anemia.  The various methods of treatment have been discussed with the patient and family. After consideration of risks, benefits and other options for treatment, the patient has consented to  Procedure(s): ?COLONOSCOPY WITH PROPOFOL (N/A) as a surgical intervention.  The patient's history has been reviewed, patient examined, no change in status, stable for surgery.  I have reviewed the patient's chart and labs.  Questions were answered to the patient's satisfaction.   ? ? ?Lajuan Lines Derran Sear ? ? ?

## 2021-10-23 NOTE — Progress Notes (Signed)
?[facility] ?Progress Note  ? ?Subjective:    ?Seen and examined patient at bedside. Appears comfortable. No issues. Tolerated yesterday's HD with net UF 2.6L. S/p EGD 4/4 and Colonoscopy 4/5.  ? ?Objective ?Vitals:  ? 10/23/21 1038 10/23/21 1218 10/23/21 1233 10/23/21 1259  ?BP: (!) 171/79 (!) 148/84 (!) 165/84 (!) 189/95  ?Pulse: 70 75 77 84  ?Resp: 15 19 17 18   ?Temp: 98.1 ?F (36.7 ?C) 98.1 ?F (36.7 ?C) 97.6 ?F (36.4 ?C) 97.6 ?F (36.4 ?C)  ?TempSrc: Temporal   Axillary  ?SpO2: 100% 100% 99% 100%  ?Weight:      ? ?Physical Exam ?General: WDWN male, alert and in NAD, communicates well with white board ?Heart: RRR, no murmurs, rubs or gallops ?Lungs: CTA anteriorly.  ?Abdomen: Soft, non-distended, +BS ?Extremities: no edema bilateral lower extremities ?Dialysis Access: RUE AVF (+) B/T ? ?Filed Weights  ? 10/22/21 1410 10/22/21 1753 10/23/21 0522  ?Weight: 88.9 kg 85.9 kg 76.9 kg  ? ? ?Intake/Output Summary (Last 24 hours) at 10/23/2021 1443 ?Last data filed at 10/23/2021 1233 ?Gross per 24 hour  ?Intake 153 ml  ?Output 2600 ml  ?Net -7169 ml  ? ? ?Additional Objective ?Labs: ?Basic Metabolic Panel: ?Recent Labs  ?Lab 10/19/21 ?1150 10/20/21 ?0157 10/22/21 ?6789 10/23/21 ?1113 10/23/21 ?1126 10/23/21 ?1134  ?NA 138 136 136 134* 133* 138  ?K 4.7 4.6 4.0 7.8* 7.8* 4.5  ?CL 94* 96* 97* 100 101 99  ?CO2 30 28 24   --   --   --   ?GLUCOSE 96 78 91 87 86 85  ?BUN 51* 61* 48* 37* 37* 25*  ?CREATININE 12.02* 13.48* 13.01* 10.80* 10.40* 10.50*  ?CALCIUM 9.8 9.1 9.7  --   --   --   ?PHOS  --   --  5.9*  --   --   --   ? ?Liver Function Tests: ?Recent Labs  ?Lab 10/19/21 ?1150 10/22/21 ?0319  ?AST 20  --   ?ALT 16  --   ?ALKPHOS 62  --   ?BILITOT 0.1*  --   ?PROT 6.8  --   ?ALBUMIN 3.8 3.5  ? ?No results for input(s): LIPASE, AMYLASE in the last 168 hours. ?CBC: ?Recent Labs  ?Lab 10/19/21 ?1150 10/19/21 ?1937 10/20/21 ?0157 10/21/21 ?3810 10/22/21 ?1751 10/23/21 ?1113 10/23/21 ?1126 10/23/21 ?1134  ?WBC 3.7* 4.1  3.6* 3.6* 5.2  --   --   --   ?NEUTROABS  --   --   --   --  4.0  --   --   --   ?HGB 6.2* 6.6* 6.6* 8.2* 8.5* 9.9* 10.2* 10.9*  ?HCT 19.4* 20.1* 20.5* 25.3* 25.6* 29.0* 30.0* 32.0*  ?MCV 85.8 84.5 83.0 83.8 82.6  --   --   --   ?PLT 89* 84* 75* 90* 75*  --   --   --   ? ?Blood Culture ?   ?Component Value Date/Time  ? [place], CLEAN CATCH 03/19/2007 1550  ? [place] ADDED [id number] 03/19/2007 1550  ? CULT  03/19/2007 1550  ?  Multiple bacterial morphotypes present, none predominant. Suggest appropriate recollection if clinically indicated.  ? REPTSTATUS 03/21/2007 FINAL 03/19/2007 1550  ? ? ?Cardiac Enzymes: ?No results for input(s): CKTOTAL, CKMB, CKMBINDEX, TROPONINI in the last 168 hours. ?CBG: ?Recent Labs  ?Lab 10/22/21 ?1543 10/22/21 ?2104 10/23/21 ?0631 10/23/21 ?1257 10/23/21 ?1325  ?GLUCAP 91 95 75 56* 87  ? ?Iron Studies:  ?Recent Labs  ?  10/23/21 ?0116  ?  IRON 108  ?TIBC 269  ?FERRITIN 1,322*  ? ?Lab Results  ?Component Value Date  ? INR 1.0 10/21/2021  ? INR 0.98 04/03/2017  ? ?Studies/Results: ?No results found. ? ?Medications: ? sodium chloride    ? ? amLODipine  10 mg Oral QHS  ? Chlorhexidine Gluconate Cloth  6 each Topical Q0600  ? cinacalcet  30 mg Oral Once per day on Mon Wed Fri  ? cloNIDine  0.3 mg Oral TID  ? darbepoetin (ARANESP) injection - DIALYSIS  100 mcg Intravenous Q Sun-HD  ? doxercalciferol  10 mcg Intravenous Q T,Th,Sa-HD  ? furosemide  40 mg Oral Daily  ? hydrALAZINE  25 mg Oral BID  ? losartan  100 mg Oral Daily  ? metoprolol succinate  50 mg Oral QHS  ? multivitamin  1 tablet Oral QHS  ? pantoprazole  40 mg Oral BID AC  ? sevelamer carbonate  1,600 mg Oral TID WC  ? sevelamer carbonate  800 mg Oral With snacks  ? sodium chloride flush  3 mL Intravenous Q12H  ? ? ?Dialysis Orders: ?[facility]  on TTS . ?180NRe, 4 hr 30 min, BFR 450, DFR Auto 1.5, EDW 79.5kg, 2K, 2Ca, AVF 15g, heparin 2000 unit bolus and 1000 unit bolus mid HD ?Mircera 127mcg IV q 2 weeks-  last dose 67mcg on 10/05/21 ?Hectorol 65mcg IV q HD ?Sensipar 156mcg PO q HD ? ?Assessment/Plan: ?Symptomatic anemia: History of +FOBT outpatient but negative here. Received 2 units PRBC this admit, GI bleed work up per primary team. S/p EGD 4/4 and Colonoscopy 4/5. Reviewed GI note: EGD unrevealing and if further bleeding occurs, needs capsule. On aranesp with dialysis here. Hgb now 10.9. ?ESRD:  Attends dialysis on TTS schedule but was sent to ED Saturday without dialysis. Getting make up treatment today then resume TTS schedule. No heparin. Plan for HF 10/24/21 if patient is still here. ?Hypertension/volume: Patient euvolemic on exam. Has a suprapubic catheter and reports he still makes "a few cups" of urine per day. Bps relatively stable-noted high reading this AM. He is on many outpatient BP meds (amlodipine 10mg  QHS, clonidine 0.3mg  TID, doxazosin 4mg  QHS, hydralazine 25mg  BID, losartan 100mg  QD, metoprolol ER 50mg  QHS). BP is only moderately elevated here without amlodipine, doxazosin or hydralazine. Unclear if he is actually taking all of these medications as he is a poor historian. Follow BP closely.  ?Metabolic bone disease: Calcium controlled. Continue hectorol, sensipar and binders ?Nutrition:  Resume renal diet  ? ?Tobie Poet, NP ?[facility] ?10/23/2021,2:43 PM ? LOS: 2 days  ?  ?

## 2021-10-24 ENCOUNTER — Encounter (HOSPITAL_COMMUNITY): Payer: Self-pay | Admitting: Internal Medicine

## 2022-01-07 ENCOUNTER — Telehealth: Payer: Self-pay | Admitting: Internal Medicine

## 2022-01-07 NOTE — Telephone Encounter (Signed)
Left message for patient to call back to schedule Medicare Annual Wellness Visit   No hx of AWV eligible as of 07/21/09  Please schedule at anytime with [facility] Advisor if patient calls the office back.    75 Minutes appointment   Any questions, please call me at [phone]

## 2022-01-19 IMAGING — CT CT HEAD W/O CM
4 series · 16 of 47 positions shown, 18 images · non-contrast
Comparison: 07/21/2019

CLINICAL DATA: Head trauma

Abnormal mental status
EXAM:
CT HEAD WITHOUT CONTRAST
TECHNIQUE: Contiguous axial images were obtained from the base of the skull
through the vertex without intravenous contrast.

[Series 3: head without · axial · non-contrast · 0.47mm/px · z∈[-108,+12]mm · 7 of 32 slices shown, 9 images]
[im 4/32  brain]
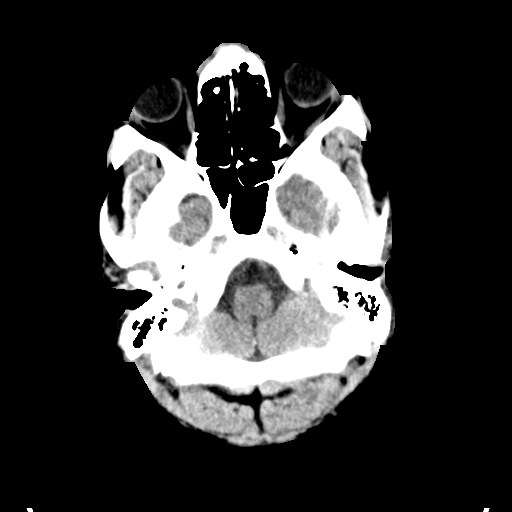
[im 4/32  bone]
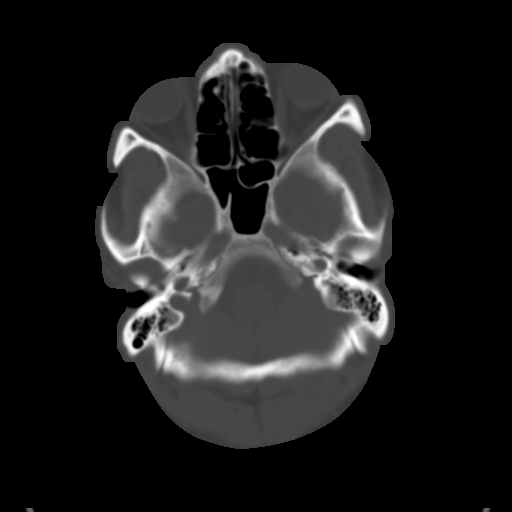
[im 8/32  brain]
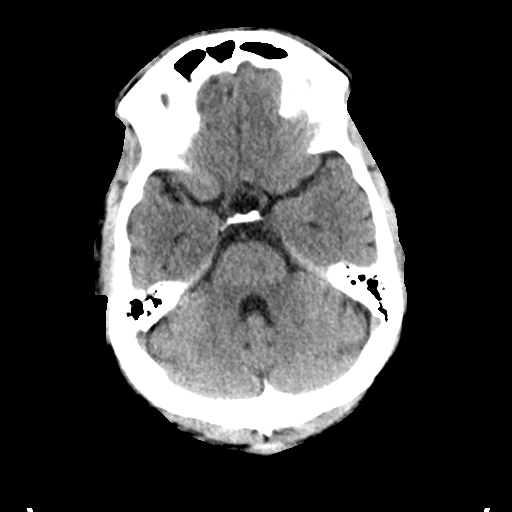
[im 12/32  brain]
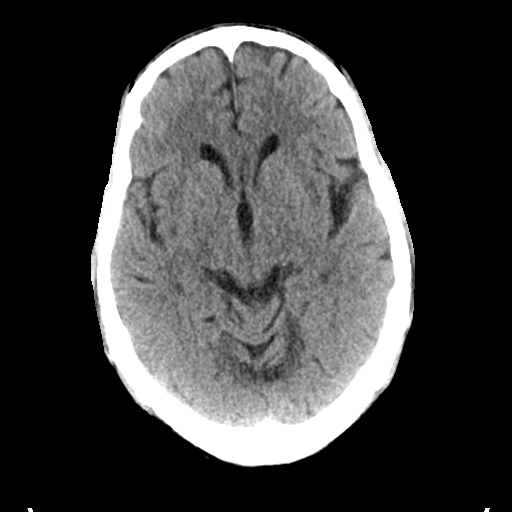
[im 16/32  brain]
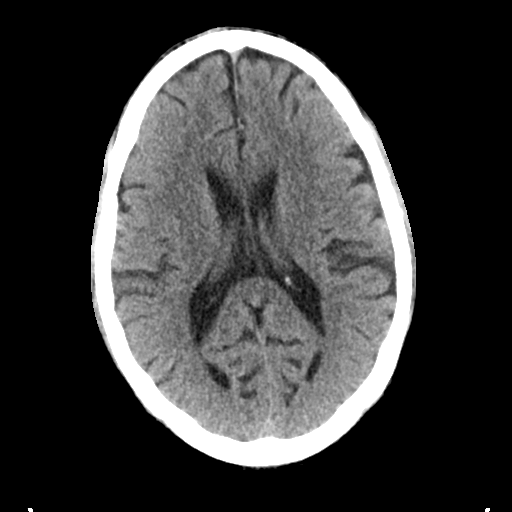
[im 20/32  brain]
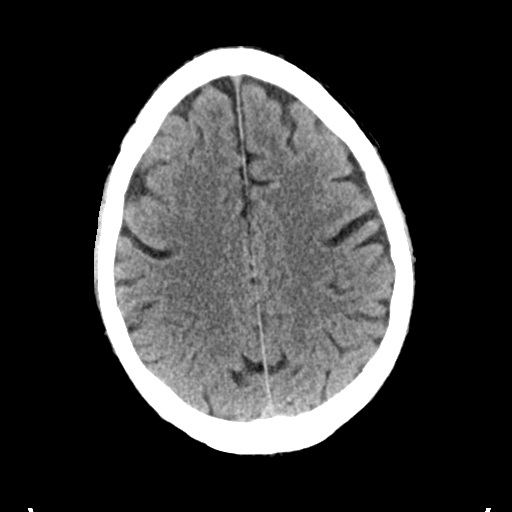
[im 20/32  bone]
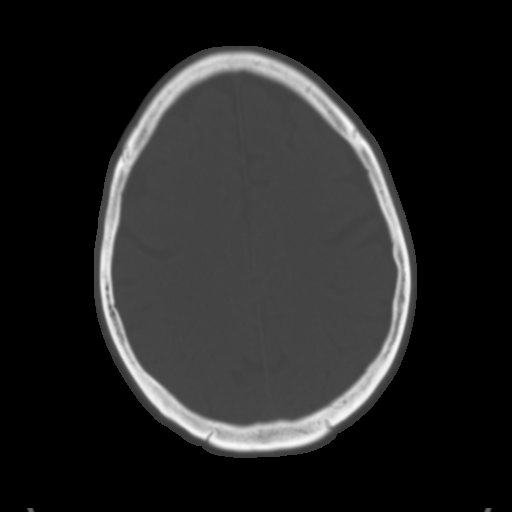
[im 24/32  brain]
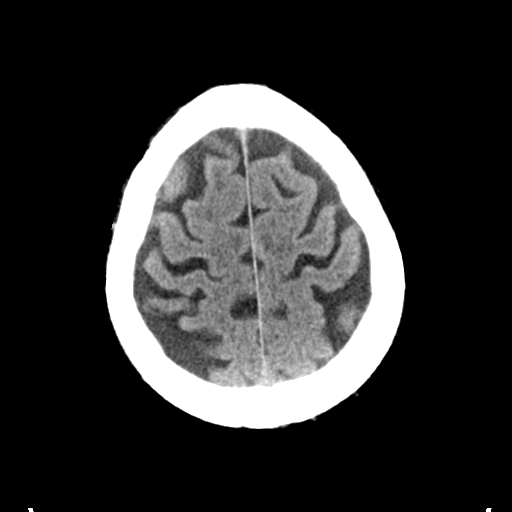
[im 28/32  brain]
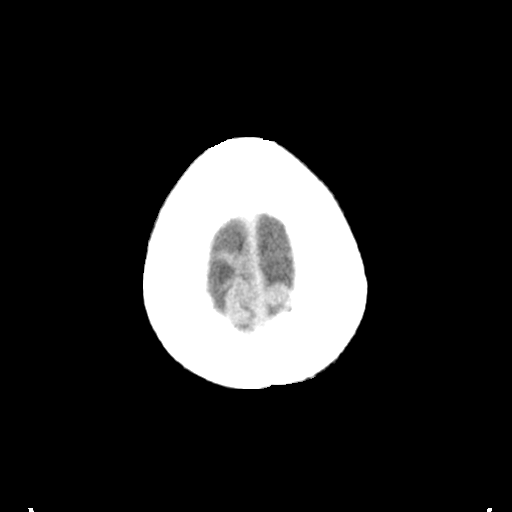

[Series 4: head bone · axial · 0.47mm/px · z∈[-108,-76]mm · 3 of 80 slices shown]
[im 8/80  bone]
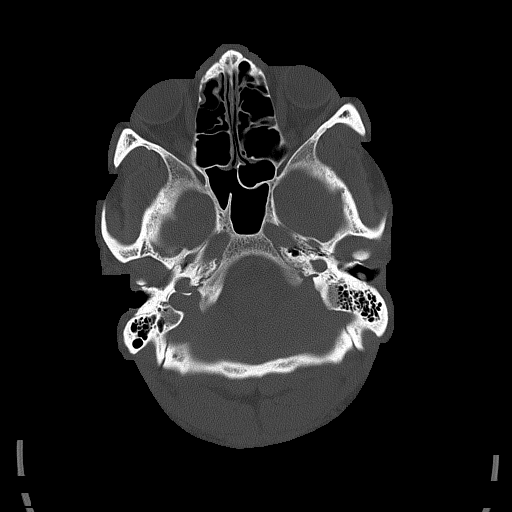
[im 16/80  bone]
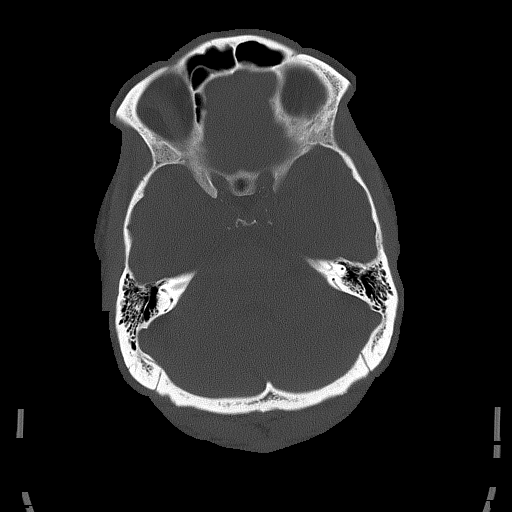
[im 24/80  bone]
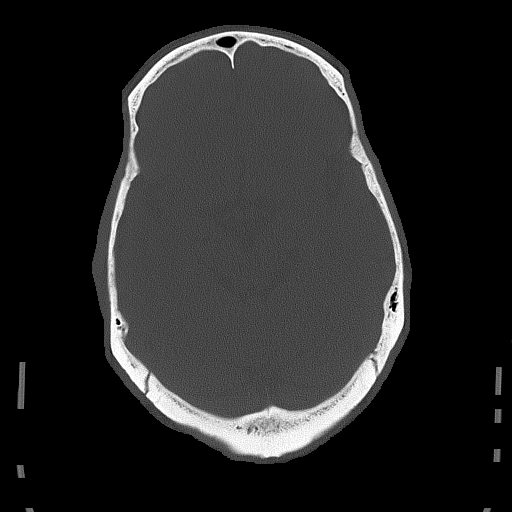

[Series 5: head without cor · coronal · non-contrast · 0.33mm/px · 3 of 75 slices shown]
[im 25/75  brain]
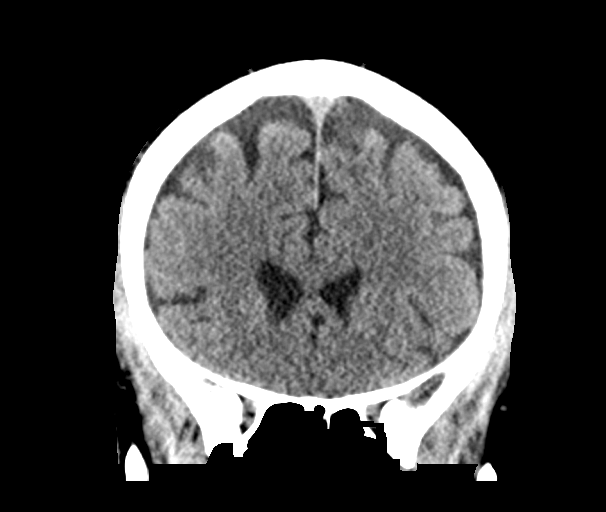
[im 33/75  brain]
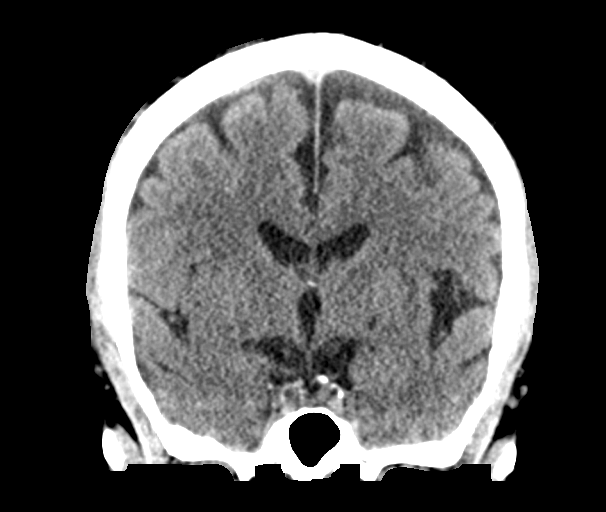
[im 42/75  brain]
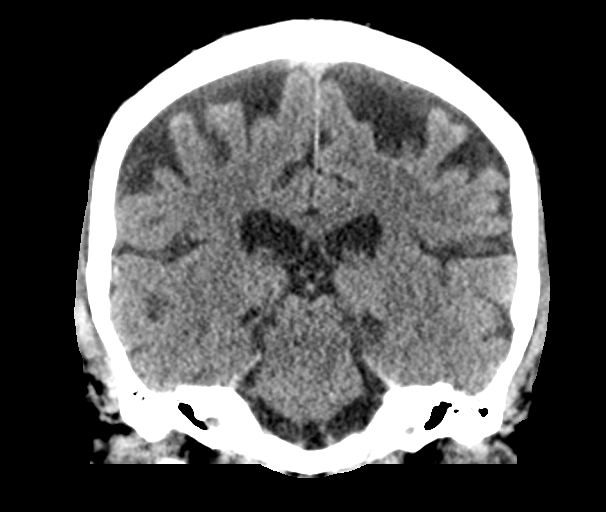

[Series 6: head without sag · sagittal · non-contrast · 0.34mm/px · 3 of 57 slices shown]
[im 19/57  brain]
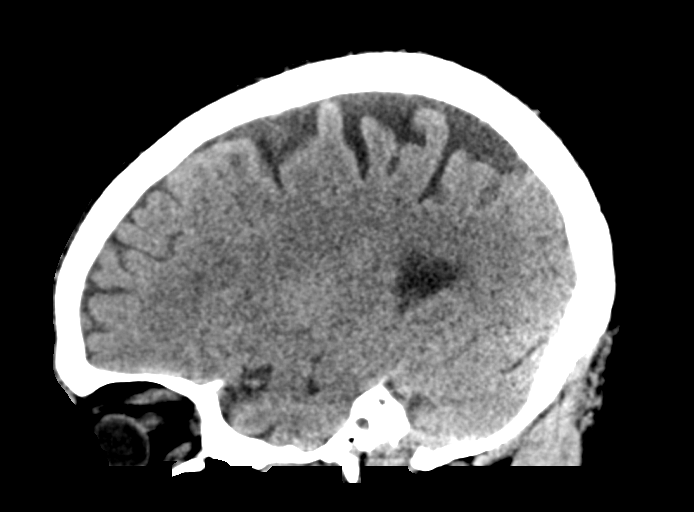
[im 29/57  brain]
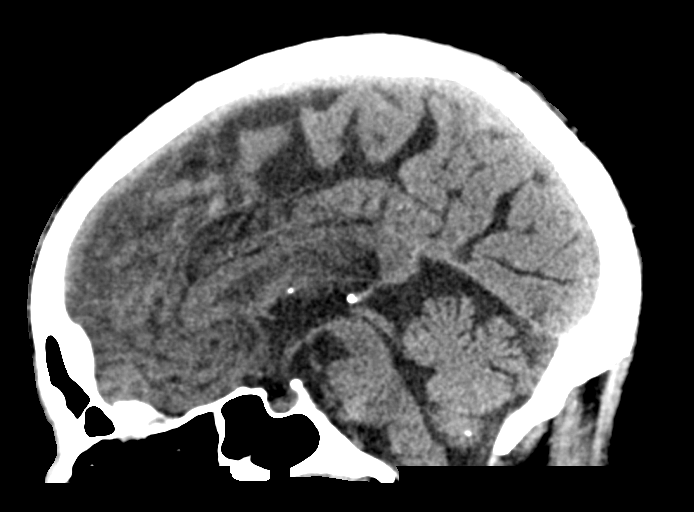
[im 38/57  brain]
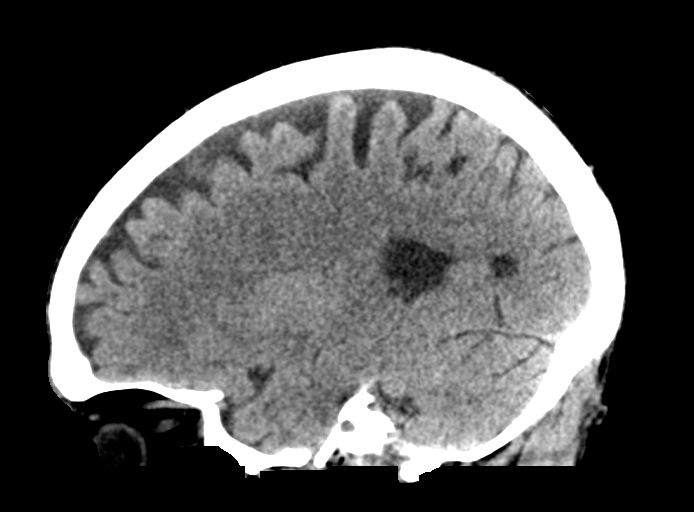

[16 of 47 positions shown; findings below may reference images not displayed]

FINDINGS: Brain: No evidence of acute infarction, hemorrhage, hydrocephalus,
extra-axial collection or mass lesion/mass effect. Diffuse cortical
atrophy, advanced for patient's age.

Vascular: No hyperdense vessel or unexpected calcification.

Skull: Normal. Negative for fracture or focal lesion.

Sinuses/Orbits: No acute finding.

Other: None.
IMPRESSION: No acute intracranial abnormality.

## 2022-04-15 ENCOUNTER — Ambulatory Visit: Payer: Medicare Other | Admitting: Physician Assistant

## 2022-04-16 ENCOUNTER — Encounter: Payer: Self-pay | Admitting: Physician Assistant

## 2022-04-16 ENCOUNTER — Ambulatory Visit (INDEPENDENT_AMBULATORY_CARE_PROVIDER_SITE_OTHER): Payer: Medicare Other | Admitting: Physician Assistant

## 2022-04-16 VITALS — BP 148/90 | HR 77 | Ht 74.0 in | Wt 183.4 lb

## 2022-04-16 DIAGNOSIS — N186 End stage renal disease: Secondary | ICD-10-CM

## 2022-04-16 DIAGNOSIS — Z794 Long term (current) use of insulin: Secondary | ICD-10-CM

## 2022-04-16 DIAGNOSIS — I4891 Unspecified atrial fibrillation: Secondary | ICD-10-CM

## 2022-04-16 DIAGNOSIS — D509 Iron deficiency anemia, unspecified: Secondary | ICD-10-CM

## 2022-04-16 DIAGNOSIS — Z992 Dependence on renal dialysis: Secondary | ICD-10-CM

## 2022-04-16 DIAGNOSIS — F79 Unspecified intellectual disabilities: Secondary | ICD-10-CM

## 2022-04-16 DIAGNOSIS — E0822 Diabetes mellitus due to underlying condition with diabetic chronic kidney disease: Secondary | ICD-10-CM

## 2022-04-16 NOTE — Patient Instructions (Addendum)
_______________________________________________________  If you are age 53 or older, your body mass index should be between 23-30. Your Body mass index is 23.54 kg/m. If this is out of the aforementioned range listed, please consider follow up with your Primary Care Provider.  If you are age 40 or younger, your body mass index should be between 19-25. Your Body mass index is 23.54 kg/m. If this is out of the aformentioned range listed, please consider follow up with your Primary Care Provider.   ________________________________________________________  The [facility] providers would like to encourage you to use [facility] to communicate with providers for non-urgent requests or questions.  Due to long hold times on the telephone, sending your provider a message by [facility] may be a faster and more efficient way to get a response.  Please allow 48 business hours for a response.  Please remember that this is for non-urgent requests.   Due to recent changes in healthcare laws, you may see the results of your imaging and laboratory studies on MyChart before your provider has had a chance to review them.  We understand that in some cases there may be results that are confusing or concerning to you. Not all laboratory results come back in the same time frame and the provider may be waiting for multiple results in order to interpret others.  Please give [place] 48 hours in order for your provider to thoroughly review all the results before contacting the office for clarification of your results.    It was a pleasure to see you today!  Thank you for trusting me with your gastrointestinal care!

## 2022-04-16 NOTE — Progress Notes (Signed)
Chief Complaint: IDA  HPI:    Mr. John Woodard is a  51 y/o AA male with a past medical history of constipation, ESRD on dialysis, GERD, intellectual disability/cognitive impairment, neurogenic bladder with a suprapubic catheter, and A-fib, known to Dr. Hilarie Fredrickson, who was referred to me by Janith Lima, MD for a complaint of iron deficiency anemia.      10/21/2021 patient consulted by our service in the hospital for acute on chronic anemia, at that time hemoglobin was 6.2 and he had been given 2 units PRBCs and raised to 8.2.  He was set up for repeat EGD and colonoscopy.  10/22/2021 EGD with LA grade a reflux esophagitis, 4 cm hiatal hernia and multiple gastric polyps as well as a single duodenal polyp.  Duodenum normal.  10/23/2021 colonoscopy was normal.  Discussed that if there is repeat anemia or GI bleeding would recommend a small bowel pill capsule endoscopy.    03/06/2022 hemoglobin 7.9, platelets 113, TIBC normal.  Percent saturation 71%.  WBC 3.99.  Low.  (Cannot see prior records).    Today, history is very limited, the patient presents to clinic and it is obvious he is not exactly sure why he is here.  He talks a lot about cutting back his eating to help with his fluid levels and how his legs and back hurt if his fluid is off.  He is having a hard time controlling all of that.  Does tell me he takes a stool softener at night to have a bowel movement which works well for him.  Denies seeing any blood in his stool.    Denies fever, chills, weight loss, change in bowel habits, abdominal pain, heartburn or reflux.  Past Medical History:  Diagnosis Date   Anemia    Constipation    ESRD    HD TTS   ESRD on dialysis [facility])    [place] T, Th , Sat   GERD (gastroesophageal reflux disease)    Hard of hearing    Hypertension    PAF (paroxysmal atrial fibrillation) [facility])     Past Surgical History:  Procedure Laterality Date   A/V SHUNTOGRAM Left 04/03/2017   Procedure: A/V Shuntogram;   Surgeon: Elam Dutch, MD;  Location: [place] [facility];  Service: Cardiovascular;  Laterality: Left;   AV FISTULA PLACEMENT Right 09/19/2020   Procedure: RIGHT UPPER EXTREMITY FIRST STAGE BRACHIOCEPHALIC ARTERIOVENOUS (AV) FISTULA CREATION;  Surgeon: Serafina Mitchell, MD;  Location: [facility] OR;  Service: Vascular;  Laterality: Right;   AVF Left    BASCILIC VEIN TRANSPOSITION Left 04/09/2018   Procedure: FIRST STAGE BACILIC VEIN TRANSPOSITION LEFT ARM;  Surgeon: Serafina Mitchell, MD;  Location: [place];  Service: Vascular;  Laterality: Left;   BIOPSY  10/22/2021   Procedure: BIOPSY;  Surgeon: Jerene Bears, MD;  Location: [place];  Service: Gastroenterology;;   COLONOSCOPY  2018   COLONOSCOPY WITH PROPOFOL N/A 10/23/2021   Procedure: COLONOSCOPY WITH PROPOFOL;  Surgeon: Jerene Bears, MD;  Location: [place];  Service: Gastroenterology;  Laterality: N/A;   ESOPHAGOGASTRODUODENOSCOPY (EGD) WITH PROPOFOL N/A 10/22/2021   Procedure: ESOPHAGOGASTRODUODENOSCOPY (EGD) WITH PROPOFOL;  Surgeon: Jerene Bears, MD;  Location: [facility] ENDOSCOPY;  Service: Gastroenterology;  Laterality: N/A;   FISTULA SUPERFICIALIZATION Left 06/09/2018   Procedure: FISTULA SUPERFICIALIZATION LEFT UPPER ARM;  Surgeon: Serafina Mitchell, MD;  Location: [place];  Service: Vascular;  Laterality: Left;   FISTULOGRAM Left 07/16/2017   Procedure: FISTULOGRAM LEFT ARM  ARTERIOVENOUS FISTULA;  Surgeon: Serafina Mitchell, MD;  Location: [facility] OR;  Service: Vascular;  Laterality: Left;   FLEXIBLE SIGMOIDOSCOPY  10/2016   PERIPHERAL VASCULAR BALLOON ANGIOPLASTY Left 04/03/2017   Procedure: PERIPHERAL VASCULAR BALLOON ANGIOPLASTY;  Surgeon: Elam Dutch, MD;  Location: [place] [facility];  Service: Cardiovascular;  Laterality: Left;  Arm AV fistula   POLYPECTOMY  10/22/2021   Procedure: POLYPECTOMY;  Surgeon: Jerene Bears, MD;  Location: [facility] ENDOSCOPY;  Service: Gastroenterology;;   REVISON OF ARTERIOVENOUS FISTULA Left 85/46/2703   Procedure:  PLICATION  OF ARTERIOVENOUS FISTULA  LEFT ARM;  Surgeon: Serafina Mitchell, MD;  Location: [facility] OR;  Service: Vascular;  Laterality: Left;   VASCULAR SURGERY      Current Outpatient Medications  Medication Sig Dispense Refill   amLODipine (NORVASC) 5 MG tablet Take 10 mg by mouth at bedtime.     cinacalcet (SENSIPAR) 30 MG tablet Take 30 mg by mouth 3 (three) times a week. Tues, Thurs, Sat     cloNIDine (CATAPRES) 0.3 MG tablet Take 1 tablet (0.3 mg total) by mouth 3 (three) times daily. 90 tablet 0   esomeprazole (NEXIUM) 40 MG capsule Take 40 mg by mouth daily before breakfast.     furosemide (LASIX) 40 MG tablet Take 40 mg by mouth daily.     hydrALAZINE (APRESOLINE) 25 MG tablet Take 25 mg by mouth 2 (two) times daily.     losartan (COZAAR) 100 MG tablet Take 100 mg by mouth daily.     metoprolol succinate (TOPROL-XL) 50 MG 24 hr tablet Take 1 tablet (50 mg total) by mouth at bedtime. Take with or immediately following a meal. 30 tablet 0   multivitamin (RENA-VIT) TABS tablet Take 1 tablet by mouth at bedtime.     sevelamer carbonate (RENVELA) 800 MG tablet Take 800-1,600 mg by mouth in the morning, at noon, in the evening, and at bedtime. Take 2 tablets (1600 mg) three times daily with meals and 1 tablet (800 mg) with one snack.     No current facility-administered medications for this visit.    Allergies as of 04/16/2022 - Review Complete 04/16/2022  Allergen Reaction Noted   Naproxen Anaphylaxis and Other (See Comments) 05/07/2015   Lactose intolerance (gi) Diarrhea and Other (See Comments) 05/07/2015   Penicillins Other (See Comments) 12/30/2012    Family History  Problem Relation Age of Onset   Hypertension Mother    Diabetes Mellitus II Sister     Social History   Socioeconomic History   Marital status: Single    Spouse name: Not on file   Number of children: Not on file   Years of education: Not on file   Highest education level: Not on file  Occupational History    Not on file  Tobacco Use   Smoking status: Never   Smokeless tobacco: Never  Vaping Use   Vaping Use: Never used  Substance and Sexual Activity   Alcohol use: No   Drug use: No   Sexual activity: Not on file  Other Topics Concern   Not on file  Social History Narrative   Not on file   Social Determinants of Health   Financial Resource Strain: Not on file  Food Insecurity: Not on file  Transportation Needs: Not on file  Physical Activity: Not on file  Stress: Not on file  Social Connections: Not on file  Intimate Partner Violence: Not on file    Review of Systems:  Constitutional: No weight loss, fever or chills Cardiovascular: No chest pain Respiratory: No SOB  Gastrointestinal: See HPI and otherwise negative   Physical Exam:  Vital signs: BP (!) 148/90   Pulse 77   Ht 6\' 2"  (1.88 m)   Wt 183 lb 6 oz (83.2 kg)   BMI 23.54 kg/m   Constitutional:   Pleasant AA male appears to be in NAD, Well developed, Well nourished, alert and cooperative Respiratory: Respirations even and unlabored. Lungs clear to auscultation bilaterally.   No wheezes, crackles, or rhonchi.  Cardiovascular: Normal S1, S2. No MRG. Regular rate and rhythm. No peripheral edema, cyanosis or pallor.  Gastrointestinal:  Soft, nondistended, nontender. No rebound or guarding. Normal bowel sounds. No appreciable masses or hepatomegaly. Rectal:  Not performed.  Psychiatric: Oriented to person, place and time.+cognitive impairment, limited historian  See HPI for most recent labs.  Assessment: 1.  Acute on chronic iron deficiency anemia: Previous hospitalization for the same in April with EGD and colonoscopy unrevealing, at that time recommendations were for pill capsule endoscopy if anemia represented 2.  ESRD on HD 3.  Cognitive/intellectual impairment  Plan: 1.  We will request labs from the past few dialysis visits so we can get an idea of trend. 2.  Martin Majestic ahead and scheduled patient for a pill  capsule endoscopy given referral for ongoing anemia. 3.  Patient to continue to follow dialysis with blood transfusion, labs and iron as needed. 4.  Patient to follow in clinic per recommendations after the above with Dr. Hilarie Fredrickson or myself.  Ellouise Newer, PA-C [facility] 04/16/2022, 8:33 AM  Cc: Janith Lima, MD

## 2022-04-16 NOTE — Progress Notes (Signed)
Addendum: Reviewed and agree with assessment and management plan. Seniah Lawrence M, MD  

## 2022-04-30 ENCOUNTER — Ambulatory Visit: Payer: Medicare Other | Admitting: Internal Medicine

## 2022-04-30 NOTE — Patient Instructions (Signed)
You may have a clear liquid diet at 10:30 am  You may have a light lunch beginning at 12:30 ( ex 1/2 sandwich and a bowl of soup)  You may resume your normal diet at 5:00 pm  If you experience any abdominal pain, nausea ,or vomiting after ingesting the capsule please call [phone] to notify the nurse  You may not have an MRI until you have confirmation that you have passed the capsule.   Please return to the office at 4 PM today.

## 2022-04-30 NOTE — Progress Notes (Signed)
Patient arrived for appt. Pt did not prep for VCE. I spoke with Nicoletta Ba, PA-C regarding wether or not to proceed. I explained the process of the VCE to the patient several times but there was a lack of understanding. Pt also did not have transportation to return to the office today. After further discussion with the patient and Amy it was decided that the best decision would be to cancel appt for today.

## 2022-05-07 NOTE — Progress Notes (Signed)
Canceled.

## 2022-11-11 ENCOUNTER — Ambulatory Visit: Payer: Medicare Other | Admitting: Internal Medicine

## 2022-11-26 ENCOUNTER — Ambulatory Visit: Payer: Medicare Other | Admitting: Internal Medicine

## 2022-12-10 ENCOUNTER — Encounter: Payer: Self-pay | Admitting: Internal Medicine

## 2022-12-10 ENCOUNTER — Ambulatory Visit (INDEPENDENT_AMBULATORY_CARE_PROVIDER_SITE_OTHER): Payer: Medicare Other | Admitting: Internal Medicine

## 2022-12-10 VITALS — BP 132/86 | HR 80 | Temp 98.1°F | Resp 16 | Ht 74.0 in | Wt 176.0 lb

## 2022-12-10 DIAGNOSIS — Z992 Dependence on renal dialysis: Secondary | ICD-10-CM

## 2022-12-10 DIAGNOSIS — I4891 Unspecified atrial fibrillation: Secondary | ICD-10-CM | POA: Diagnosis not present

## 2022-12-10 DIAGNOSIS — I10 Essential (primary) hypertension: Secondary | ICD-10-CM | POA: Diagnosis not present

## 2022-12-10 DIAGNOSIS — N186 End stage renal disease: Secondary | ICD-10-CM | POA: Diagnosis not present

## 2022-12-10 DIAGNOSIS — D696 Thrombocytopenia, unspecified: Secondary | ICD-10-CM

## 2022-12-10 DIAGNOSIS — D508 Other iron deficiency anemias: Secondary | ICD-10-CM

## 2022-12-10 DIAGNOSIS — Z794 Long term (current) use of insulin: Secondary | ICD-10-CM

## 2022-12-10 DIAGNOSIS — E0822 Diabetes mellitus due to underlying condition with diabetic chronic kidney disease: Secondary | ICD-10-CM

## 2022-12-10 NOTE — Progress Notes (Signed)
Subjective:  Patient ID: John Woodard, male    DOB: September 27, 1970  Age: 52 y.o. MRN: [id number]  CC: Anemia, Hypertension, Diabetes, and Atrial Fibrillation   HPI John Woodard presents for f/up ---  He is undergoing dialysis.  Labs from dialysis are not available to me.  His spoken words are incoherent.   Outpatient Medications Prior to Visit  Medication Sig Dispense Refill   amLODipine (NORVASC) 5 MG tablet Take 10 mg by mouth at bedtime.     cinacalcet (SENSIPAR) 30 MG tablet Take 30 mg by mouth 3 (three) times a week. Tues, Thurs, Sat     cloNIDine (CATAPRES) 0.3 MG tablet Take 1 tablet (0.3 mg total) by mouth 3 (three) times daily. 90 tablet 0   esomeprazole (NEXIUM) 40 MG capsule Take 40 mg by mouth daily before breakfast.     furosemide (LASIX) 40 MG tablet Take 40 mg by mouth daily.     hydrALAZINE (APRESOLINE) 25 MG tablet Take 25 mg by mouth 2 (two) times daily.     losartan (COZAAR) 100 MG tablet Take 100 mg by mouth daily.     metoprolol succinate (TOPROL-XL) 50 MG 24 hr tablet Take 1 tablet (50 mg total) by mouth at bedtime. Take with or immediately following a meal. 30 tablet 0   multivitamin (RENA-VIT) TABS tablet Take 1 tablet by mouth at bedtime.     sevelamer carbonate (RENVELA) 800 MG tablet Take 800-1,600 mg by mouth in the morning, at noon, in the evening, and at bedtime. Take 2 tablets (1600 mg) three times daily with meals and 1 tablet (800 mg) with one snack.     No facility-administered medications prior to visit.    ROS Review of Systems  Reason unable to perform ROS: words are jibberish.    Objective:  BP 132/86   Pulse 80   Temp 98.1 F (36.7 C) (Oral)   Resp 16   Ht 6\' 2"  (1.88 m)   Wt 176 lb (79.8 kg)   SpO2 98%   BMI 22.60 kg/m   BP Readings from Last 3 Encounters:  12/10/22 132/86  04/16/22 (!) 148/90  10/23/21 (!) 189/95    Wt Readings from Last 3 Encounters:  12/10/22 176 lb (79.8 kg)  04/16/22 183 lb 6 oz (83.2 kg)  10/23/21  169 lb 8 oz (76.9 kg)    Physical Exam Vitals reviewed.  Constitutional:      Appearance: He is not ill-appearing.  HENT:     Mouth/Throat:     Mouth: Mucous membranes are moist.  Eyes:     General: No scleral icterus.    Conjunctiva/sclera: Conjunctivae normal.  Cardiovascular:     Rate and Rhythm: Normal rate. Rhythm irregularly irregular.     Heart sounds: Normal heart sounds, S1 normal and S2 normal. No murmur heard.    Comments: EKG- A fib, 79 bpm LAFB Anterior low voltage Pulmonary:     Effort: Pulmonary effort is normal.     Breath sounds: No stridor. No wheezing or rhonchi.  Abdominal:     General: Abdomen is flat.     Palpations: There is no mass.     Tenderness: There is no abdominal tenderness. There is no guarding.     Hernia: No hernia is present.  Musculoskeletal:     Cervical back: Neck supple.     Right lower leg: No edema.     Left lower leg: No edema.  Skin:    General: Skin  is warm.  Neurological:     General: No focal deficit present.     Mental Status: He is alert. Mental status is at baseline.  Psychiatric:        Attention and Perception: He is inattentive.        Mood and Affect: Mood normal.        Speech: Speech is tangential.        Behavior: Behavior normal. Behavior is cooperative.     Lab Results  Component Value Date   WBC 5.2 10/22/2021   HGB 10.9 (L) 10/23/2021   HCT 32.0 (L) 10/23/2021   PLT 75 (L) 10/22/2021   GLUCOSE 85 10/23/2021   ALT 16 10/19/2021   AST 20 10/19/2021   NA 138 10/23/2021   K 4.5 10/23/2021   CL 99 10/23/2021   CREATININE 10.50 (H) 10/23/2021   BUN 25 (H) 10/23/2021   CO2 24 10/22/2021   TSH 2.291 10/19/2021   INR 1.0 10/21/2021   HGBA1C 4.3 08/24/2020    No results found.  Assessment & Plan:   Atrial fibrillation with normal ventricular rate (HCC)- He has good rate control.  Anticoagulation is contraindicated. 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 -     Basic metabolic panel; Future -     Hemoglobin A1c; Future -     HM Diabetes Foot Exam     Follow-up: Return in about 3 months (around 03/12/2023).  Sanda Linger, MD

## 2022-12-10 NOTE — Patient Instructions (Signed)
Atrial Fibrillation Atrial fibrillation (AFib) is a type of irregular or rapid heartbeat (arrhythmia). In AFib, the top part of the heart (atria) beats in an irregular pattern. This makes the heart unable to pump blood normally and effectively. The goal of treatment is to prevent blood clots from forming, control your heart rate, or restore your heartbeat to a normal rhythm. If this condition is not treated, it can cause serious problems, such as a weakened heart muscle (cardiomyopathy) or a stroke. What are the causes? This condition is often caused by medical conditions that damage the heart's electrical system. These include: High blood pressure (hypertension). This is the most common cause. Certain heart problems or conditions, such as heart failure, coronary artery disease, heart valve problems, or heart surgery. Diabetes. Overactive thyroid (hyperthyroidism). Chronic kidney disease. Certain lung conditions, such as emphysema, pneumonia, or COPD. Obstructive sleep apnea. In some cases, the cause of this condition is not known. What increases the risk? This condition is more likely to develop in: Older adults. Athletes who do endurance exercise. People who have a family history of AFib. Males. People who are Caucasian. People who are obese. People who smoke or misuse alcohol. What are the signs or symptoms? Symptoms of this condition include: Fast or irregular heartbeats (palpitations). Discomfort or pain in your chest. Shortness of breath. Sudden light-headedness or weakness. Tiring easily during exercise or activity. Syncope (fainting). Sweating. In some cases, there are no symptoms. How is this diagnosed? Your health care provider may detect AFib when taking your pulse. If detected, this condition may be diagnosed with: An electrocardiogram (ECG) to check electrical signals of the heart. An ambulatory cardiac monitor to record your heart's activity for a few days. A  transthoracic echocardiogram (TTE) to create pictures of your heart. A transesophageal echocardiogram (TEE) to create even clearer pictures of your heart. A stress test to check your blood supply while you exercise. Imaging tests, such as a CT scan or chest X-ray. Blood tests. How is this treated? Treatment depends on underlying conditions and how you feel when you get AFib. This condition may be treated with: Medicines to prevent blood clots or to treat heart rate or heart rhythm problems. Electrical cardioversion to reset the heart's rhythm. A pacemaker to correct abnormal heart rhythm. Ablation to remove the heart tissue that sends abnormal signals. Left atrial appendage closure to seal the area where blood clots can form. In some cases, underlying conditions will be treated. Follow these instructions at home: Medicines Take over-the counter and prescription medicines only as told by your provider. Do not take any new medicines without talking to your provider. If you are taking blood thinners: Talk with your provider before taking aspirin or NSAIDs. These medicines can raise your risk of bleeding. Take your medicines as told. Take them at the same time each day. Do not do things that could hurt or bruise you. Be careful to avoid falls. Wear an alert bracelet or carry a card that says that you take blood thinners. Lifestyle Do not use any products that contain nicotine or tobacco. These products include cigarettes, chewing tobacco, and vaping devices, such as e-cigarettes. If you need help quitting, ask your provider. Eat heart-healthy foods. Talk with a food expert (dietitian) to make an eating plan that is right for you. Exercise regularly as told by your provider. Do not drink alcohol. Lose weight if you are overweight. General instructions If you have obstructive sleep apnea, manage your condition as told by your provider.   Do not use diet pills unless your provider approves. Diet  pills can make heart problems worse. Keep all follow-up visits. Your provider will want to check your heart rate and rhythm regularly. Contact a health care provider if: You notice a change in the rate, rhythm, or strength of your heartbeat. You are taking a blood thinner and you notice more bruising. You tire more easily when you exercise or do heavy work. You have a sudden change in weight. Get help right away if:  You have chest pain. You have trouble breathing. You have side effects of blood thinners, such as blood in your vomit, poop (stool), or pee (urine), or bleeding that does not stop. You have any symptoms of a stroke. "BE FAST" is an easy way to remember the main warning signs of a stroke: B - Balance. Signs are dizziness, sudden trouble walking, or loss of balance. E - Eyes. Signs are trouble seeing or a sudden change in vision. F - Face. Signs are sudden weakness or numbness of the face, or the face or eyelid drooping on one side. A - Arms. Signs are weakness or numbness in an arm. This happens suddenly and usually on one side of the body. S - Speech.Signs are sudden trouble speaking, slurred speech, or trouble understanding what people say. T - Time. Time to call emergency services. Write down what time symptoms started. Other signs of a stroke, such as: A sudden, severe headache with no known cause. Nausea or vomiting. Seizure. These symptoms may be an emergency. Get help right away. Call 911. Do not wait to see if the symptoms will go away. Do not drive yourself to the hospital. This information is not intended to replace advice given to you by your health care provider. Make sure you discuss any questions you have with your health care provider. Document Revised: 03/26/2022 Document Reviewed: 03/26/2022 Elsevier Patient Education  2023 [employer]  

## 2023-02-18 ENCOUNTER — Encounter (INDEPENDENT_AMBULATORY_CARE_PROVIDER_SITE_OTHER): Payer: Self-pay

## 2023-03-05 ENCOUNTER — Encounter (INDEPENDENT_AMBULATORY_CARE_PROVIDER_SITE_OTHER): Payer: Self-pay

## 2023-03-11 ENCOUNTER — Ambulatory Visit (INDEPENDENT_AMBULATORY_CARE_PROVIDER_SITE_OTHER): Payer: Medicare Other | Admitting: Internal Medicine

## 2023-03-11 ENCOUNTER — Encounter: Payer: Self-pay | Admitting: Internal Medicine

## 2023-03-11 VITALS — BP 132/74 | HR 82 | Temp 98.1°F | Resp 16 | Ht 74.0 in | Wt 176.0 lb

## 2023-03-11 DIAGNOSIS — N186 End stage renal disease: Secondary | ICD-10-CM | POA: Diagnosis not present

## 2023-03-11 DIAGNOSIS — E1142 Type 2 diabetes mellitus with diabetic polyneuropathy: Secondary | ICD-10-CM

## 2023-03-11 DIAGNOSIS — I4891 Unspecified atrial fibrillation: Secondary | ICD-10-CM

## 2023-03-11 DIAGNOSIS — Z992 Dependence on renal dialysis: Secondary | ICD-10-CM | POA: Diagnosis not present

## 2023-03-11 NOTE — Progress Notes (Unsigned)
Subjective:  Patient ID: John Woodard, male    DOB: [birth year]  Age: 52 y.o. MRN: [id number]  CC: Atrial Fibrillation   HPI Tice Mcmeans presents for f/up -    Discussed the use of AI scribe software for clinical note transcription with the patient, who gave verbal consent to proceed.  History of Present Illness   The patient, currently on dialysis, presents with a concern regarding their right arm fistula. They report a blockage or 'bump' in the area of the fistula, which was recently accessed for dialysis. The patient describes discomfort related to the last tube placement by a new nurse, which they believe has caused the current issue. The last dialysis session was approximately four hours in duration and was conducted at a facility in [place]. The patient did not provide further details regarding their symptoms or the effects of the recent dialysis session.       Outpatient Medications Prior to Visit  Medication Sig Dispense Refill   amLODipine (NORVASC) 5 MG tablet Take 10 mg by mouth at bedtime.     cinacalcet (SENSIPAR) 30 MG tablet Take 30 mg by mouth 3 (three) times a week. Tues, Thurs, Sat     cloNIDine (CATAPRES) 0.3 MG tablet Take 1 tablet (0.3 mg total) by mouth 3 (three) times daily. 90 tablet 0   esomeprazole (NEXIUM) 40 MG capsule Take 40 mg by mouth daily before breakfast.     furosemide (LASIX) 40 MG tablet Take 40 mg by mouth daily.     hydrALAZINE (APRESOLINE) 25 MG tablet Take 25 mg by mouth 2 (two) times daily.     losartan (COZAAR) 100 MG tablet Take 100 mg by mouth daily.     metoprolol succinate (TOPROL-XL) 50 MG 24 hr tablet Take 1 tablet (50 mg total) by mouth at bedtime. Take with or immediately following a meal. 30 tablet 0   multivitamin (RENA-VIT) TABS tablet Take 1 tablet by mouth at bedtime.     sevelamer carbonate (RENVELA) 800 MG tablet Take 800-1,600 mg by mouth in the morning, at noon, in the evening, and at bedtime. Take 2 tablets (1600 mg) three  times daily with meals and 1 tablet (800 mg) with one snack.     No facility-administered medications prior to visit.    ROS Review of Systems  Objective:  BP 132/74 (BP Location: Left Arm, Patient Position: Sitting, Cuff Size: Large)   Pulse 82   Temp 98.1 F (36.7 C) (Oral)   Resp 16   Ht 6\' 2"  (1.88 m)   Wt 176 lb (79.8 kg)   SpO2 99%   BMI 22.60 kg/m   BP Readings from Last 3 Encounters:  03/11/23 132/74  12/10/22 132/86  04/16/22 (!) 148/90    Wt Readings from Last 3 Encounters:  03/11/23 176 lb (79.8 kg)  12/10/22 176 lb (79.8 kg)  04/16/22 183 lb 6 oz (83.2 kg)    Physical Exam Cardiovascular:     Rate and Rhythm: Normal rate. Rhythm irregularly irregular.     Heart sounds: Normal heart sounds, S1 normal and S2 normal. No murmur heard.    Comments: EKG---- A. Fib, 81 bpm LAD Anteroseptal infarct pattern Unchanged Musculoskeletal:     Right lower leg: No edema.     Left lower leg: No edema.     Lab Results  Component Value Date   WBC 5.2 10/22/2021   HGB 10.9 (L) 10/23/2021   HCT 32.0 (L) 10/23/2021   PLT 75 (L) 10/22/2021  GLUCOSE 85 10/23/2021   ALT 16 10/19/2021   AST 20 10/19/2021   NA 138 10/23/2021   K 4.5 10/23/2021   CL 99 10/23/2021   CREATININE 10.50 (H) 10/23/2021   BUN 25 (H) 10/23/2021   CO2 24 10/22/2021   TSH 2.291 10/19/2021   INR 1.0 10/21/2021   HGBA1C 4.3 08/24/2020    No results found.  Assessment & Plan:  ESRD on dialysis [facility]) -     Basic metabolic panel; Future -     CBC with Differential/Platelet; Future  Atrial fibrillation with normal ventricular rate (HCC) -     TSH; Future -     EKG 12-Lead -     Ambulatory referral to Cardiology  Diabetic polyneuropathy associated with type 2 diabetes mellitus (HCC) -     Hemoglobin A1c; Future     Follow-up: No follow-ups on file.  Sanda Linger, MD

## 2023-03-12 ENCOUNTER — Ambulatory Visit: Payer: Medicare Other | Admitting: Internal Medicine

## 2023-04-01 ENCOUNTER — Ambulatory Visit (INDEPENDENT_AMBULATORY_CARE_PROVIDER_SITE_OTHER): Payer: Medicare Other | Admitting: Internal Medicine

## 2023-04-01 ENCOUNTER — Encounter: Payer: Self-pay | Admitting: Internal Medicine

## 2023-04-01 VITALS — BP 128/88 | HR 60 | Temp 97.9°F | Resp 16 | Ht 74.0 in | Wt 178.0 lb

## 2023-04-01 DIAGNOSIS — I4819 Other persistent atrial fibrillation: Secondary | ICD-10-CM | POA: Insufficient documentation

## 2023-04-01 NOTE — Progress Notes (Signed)
Subjective:  Patient ID: John Woodard, male    DOB: December 27, 1970  Age: 52 y.o. MRN: [id number]  CC: Atrial Fibrillation   HPI John Woodard presents for f/up ---  He does dialysis every Tuesday, Thursday, and Saturday.  He complains of shortness of breath and lower extremity edema.  Outpatient Medications Prior to Visit  Medication Sig Dispense Refill   amLODipine (NORVASC) 5 MG tablet Take 10 mg by mouth at bedtime.     cinacalcet (SENSIPAR) 30 MG tablet Take 30 mg by mouth 3 (three) times a week. Tues, Thurs, Sat     cloNIDine (CATAPRES) 0.3 MG tablet Take 1 tablet (0.3 mg total) by mouth 3 (three) times daily. 90 tablet 0   esomeprazole (NEXIUM) 40 MG capsule Take 40 mg by mouth daily before breakfast.     furosemide (LASIX) 40 MG tablet Take 40 mg by mouth daily.     hydrALAZINE (APRESOLINE) 25 MG tablet Take 25 mg by mouth 2 (two) times daily.     losartan (COZAAR) 100 MG tablet Take 100 mg by mouth daily.     metoprolol succinate (TOPROL-XL) 50 MG 24 hr tablet Take 1 tablet (50 mg total) by mouth at bedtime. Take with or immediately following a meal. 30 tablet 0   multivitamin (RENA-VIT) TABS tablet Take 1 tablet by mouth at bedtime.     sevelamer carbonate (RENVELA) 800 MG tablet Take 800-1,600 mg by mouth in the morning, at noon, in the evening, and at bedtime. Take 2 tablets (1600 mg) three times daily with meals and 1 tablet (800 mg) with one snack.     No facility-administered medications prior to visit.    ROS Review of Systems  Constitutional:  Negative for appetite change, chills, diaphoresis, fatigue and fever.  Respiratory:  Positive for shortness of breath. Negative for cough, chest tightness and wheezing.   Cardiovascular:  Positive for leg swelling. Negative for chest pain and palpitations.  Gastrointestinal: Negative.  Negative for abdominal pain, diarrhea, nausea and vomiting.  Genitourinary: Negative.  Negative for difficulty urinating.  Musculoskeletal:  Negative.  Negative for arthralgias, joint swelling and myalgias.  Skin: Negative.  Negative for color change.  Neurological:  Negative for dizziness, weakness and headaches.  Hematological:  Negative for adenopathy. Does not bruise/bleed easily.  Psychiatric/Behavioral:  Positive for confusion.     Objective:  BP 128/88 (BP Location: Left Arm, Patient Position: Sitting, Cuff Size: Large)   Pulse 60   Temp 97.9 F (36.6 C) (Oral)   Resp 16   Ht 6\' 2"  (1.88 m)   Wt 178 lb (80.7 kg)   SpO2 97%   BMI 22.85 kg/m   BP Readings from Last 3 Encounters:  04/01/23 128/88  03/11/23 132/74  12/10/22 132/86    Wt Readings from Last 3 Encounters:  04/01/23 178 lb (80.7 kg)  03/11/23 176 lb (79.8 kg)  12/10/22 176 lb (79.8 kg)    Physical Exam Vitals reviewed.  Constitutional:      General: He is not in acute distress.    Appearance: Normal appearance. He is not toxic-appearing or diaphoretic.  HENT:     Mouth/Throat:     Mouth: Mucous membranes are moist.  Eyes:     General: No scleral icterus. Cardiovascular:     Rate and Rhythm: Normal rate. Rhythm irregularly irregular.     Pulses: Normal pulses.     Heart sounds: No murmur heard.    No friction rub. No gallop.     Comments:  Trace pitting BLE edema Pulmonary:     Effort: Pulmonary effort is normal.     Breath sounds: No stridor. No wheezing, rhonchi or rales.  Abdominal:     General: Abdomen is flat.     Palpations: There is no mass.     Tenderness: There is no abdominal tenderness. There is no guarding.     Hernia: No hernia is present.  Musculoskeletal:        General: No swelling.     Cervical back: Neck supple.     Right lower leg: Edema present.     Left lower leg: Edema present.  Lymphadenopathy:     Cervical: No cervical adenopathy.  Skin:    General: Skin is warm and dry.  Neurological:     General: No focal deficit present.     Mental Status: He is alert. Mental status is at baseline.  Psychiatric:         Mood and Affect: Mood normal.        Behavior: Behavior normal.     Lab Results  Component Value Date   WBC 5.2 10/22/2021   HGB 10.9 (L) 10/23/2021   HCT 32.0 (L) 10/23/2021   PLT 75 (L) 10/22/2021   GLUCOSE 85 10/23/2021   ALT 16 10/19/2021   AST 20 10/19/2021   NA 138 10/23/2021   K 4.5 10/23/2021   CL 99 10/23/2021   CREATININE 10.50 (H) 10/23/2021   BUN 25 (H) 10/23/2021   CO2 24 10/22/2021   TSH 2.291 10/19/2021   INR 1.0 10/21/2021   HGBA1C 4.3 08/24/2020    No results found.  Assessment & Plan:  Persistent atrial fibrillation [facility]) -     Ambulatory referral to Cardiology     Follow-up: No follow-ups on file.  Sanda Linger, MD

## 2023-05-04 DIAGNOSIS — I150 Renovascular hypertension: Secondary | ICD-10-CM | POA: Insufficient documentation

## 2023-05-11 DIAGNOSIS — L299 Pruritus, unspecified: Secondary | ICD-10-CM | POA: Insufficient documentation

## 2023-05-11 DIAGNOSIS — R509 Fever, unspecified: Secondary | ICD-10-CM | POA: Insufficient documentation

## 2023-05-11 DIAGNOSIS — R519 Headache, unspecified: Secondary | ICD-10-CM | POA: Insufficient documentation

## 2023-05-11 DIAGNOSIS — R52 Pain, unspecified: Secondary | ICD-10-CM | POA: Insufficient documentation

## 2023-05-11 DIAGNOSIS — R197 Diarrhea, unspecified: Secondary | ICD-10-CM | POA: Insufficient documentation

## 2023-06-26 ENCOUNTER — Encounter: Payer: Self-pay | Admitting: Cardiology

## 2023-06-26 ENCOUNTER — Ambulatory Visit: Payer: Medicare Other | Attending: Cardiology | Admitting: Cardiology

## 2023-06-26 VITALS — BP 140/84 | HR 62 | Resp 14 | Ht 74.0 in | Wt 179.4 lb

## 2023-06-26 DIAGNOSIS — N186 End stage renal disease: Secondary | ICD-10-CM | POA: Diagnosis present

## 2023-06-26 DIAGNOSIS — Z992 Dependence on renal dialysis: Secondary | ICD-10-CM

## 2023-06-26 DIAGNOSIS — I4819 Other persistent atrial fibrillation: Secondary | ICD-10-CM | POA: Diagnosis not present

## 2023-06-26 NOTE — Progress Notes (Signed)
Cardiology Office Note:  .   Date:  06/26/2023  ID:  John Woodard, DOB 13-Jun-1971, MRN [id number] PCP: Etta Grandchild, MD  [facility] Providers Cardiologist:  None     History of Present Illness: .   John Woodard is a 52 y.o. male Discussed with the use of AI scribe   History of Present Illness   A 52 year old patient with end-stage renal disease on dialysis (Tuesday, Thursday, Saturday) presents for evaluation of persistent atrial fibrillation. The patient reports occasional back pain and shortness of breath. He also mentions lower extremity edema. His medication regimen includes amlodipine 10mg  at bedtime, clonidine 0.3mg  three times a day, hydralazine 25mg  twice daily, losartan 100mg  daily, and metoprolol 50mg  daily.  An echocardiogram from 2018 revealed an EF of 70%, severe concentric hypertrophy, mild aortic regurgitation, mild mitral regurgitation, and a severely dilated left atrium. Recent EKGs consistently show atrial fibrillation with heart rates ranging from 69 to 81 beats per minute.  The patient also reports difficulty with fluid management due to his renal disease, and he expresses frustration with the impact of his health conditions on his strength and mobility. He mentions being behind on a scheduled injection but does not specify the medication. Despite these challenges, the patient's atrial fibrillation appears to be under good control with his current treatment regimen.          Studies Reviewed: .        Results   DIAGNOSTIC Echocardiogram: EF 70%, severe concentric hypertrophy, mild aortic regurgitation, mild mitral regurgitation, severely dilated left atrium (2018) EKG: Atrial fibrillation, heart rate 81 bpm, left anterior fascicular block (03/11/2023) EKG: Atrial fibrillation, heart rate 69 bpm (10/2021)     Risk Assessment/Calculations:           Physical Exam:   VS:  BP (!) 140/84 (BP Location: Left Arm, Patient Position: Sitting, Cuff Size:  Normal)   Pulse 62   Resp 14   Ht 6\' 2"  (1.88 m)   Wt 179 lb 6.4 oz (81.4 kg)   SpO2 97%   BMI 23.03 kg/m    Wt Readings from Last 3 Encounters:  06/26/23 179 lb 6.4 oz (81.4 kg)  04/01/23 178 lb (80.7 kg)  03/11/23 176 lb (79.8 kg)    GEN: Well nourished, well developed in no acute distress NECK: No JVD; No carotid bruits CARDIAC: IRRR, no murmurs, no rubs, no gallops RESPIRATORY:  Clear to auscultation without rales, wheezing or rhonchi  ABDOMEN: Soft, non-tender, non-distended EXTREMITIES:  Mild edema; No deformity, shunt noted  ASSESSMENT AND PLAN: .    Assessment and Plan    Atrial Fibrillation Longstanding Persistent atrial fibrillation with a heart rate of 81 bpm and left anterior fascicular block on recent EKG. Previous EKG in April 2023 showed atrial fibrillation with a heart rate of 69 bpm. Condition is well-controlled with current medications. - Order echocardiogram to evaluate heart function - Not a candidate for anticoagulation  Heart Failure with Preserved Ejection Fraction Echocardiogram from 2018 showed an ejection fraction of 70% with severe concentric hypertrophy, mild aortic regurgitation, mild mitral regurgitation, and a severely dilated left atrium. Regular monitoring of cardiac function and symptoms is necessary. - Monitor for symptoms of heart failure - HD to pull fluid - Order echocardiogram to reassess cardiac function  Hypertension On multiple antihypertensive medications including amlodipine, clonidine, hydralazine, losartan, and metoprolol. Blood pressure management is crucial given cardiac and renal conditions. Current regimen is effective. - Continue current antihypertensive medications  End Stage  Renal Disease On dialysis three times a week (Tuesday, Thursday, Saturday). Reports shortness of breath and lower extremity edema, likely related to fluid overload. Emphasized importance of fluid management and adherence to dialysis schedule. - Continue  current dialysis schedule - Monitor for signs of fluid overload  Follow-up - Schedule follow-up appointment after echocardiogram results are available.              Signed, Donato Schultz, MD

## 2023-06-26 NOTE — Patient Instructions (Addendum)
Medication Instructions:  The current medical regimen is effective;  continue present plan and medications.  *If you need a refill on your cardiac medications before your next appointment, please call your pharmacy*  Testing/Procedures: Your physician has requested that you have an echocardiogram. Echocardiography is a painless test that uses sound waves to create images of your heart. It provides your doctor with information about the size and shape of your heart and how well your heart's chambers and valves are working. This procedure takes approximately one hour. There are no restrictions for this procedure. Please do NOT wear cologne, perfume, aftershave, or lotions (deodorant is allowed). Please arrive 15 minutes prior to your appointment time.  Please note: We ask at that you not bring children with you during ultrasound (echo/ vascular) testing. Due to room size and safety concerns, children are not allowed in the ultrasound rooms during exams. Our front office staff cannot provide observation of children in our lobby area while testing is being conducted. An adult accompanying a patient to their appointment will only be allowed in the ultrasound room at the discretion of the ultrasound technician under special circumstances. We apologize for any inconvenience.   Follow-Up: At [facility], you and your health needs are our priority.  As part of our continuing mission to provide you with exceptional heart care, we have created designated Provider Care Teams.  These Care Teams include your primary Cardiologist (physician) and Advanced Practice Providers (APPs -  Physician Assistants and Nurse Practitioners) who all work together to provide you with the care you need, when you need it.  We recommend signing up for the patient portal called "MyChart".  Sign up information is provided on this After Visit Summary.  MyChart is used to connect with patients for Virtual Visits (Telemedicine).   Patients are able to view lab/test results, encounter notes, upcoming appointments, etc.  Non-urgent messages can be sent to your provider as well.   To learn more about what you can do with MyChart, go to [email or web address].    Your next appointment:   1 year(s)  Provider:   Jari Favre, PA-C, Robin Searing, NP, Jacolyn Reedy, PA-C, Eligha Bridegroom, NP, Tereso Newcomer, PA-C, or Perlie Gold, PA-C

## 2023-07-07 ENCOUNTER — Ambulatory Visit (HOSPITAL_COMMUNITY)
Admission: RE | Admit: 2023-07-07 | Discharge: 2023-07-07 | Disposition: A | Discharge status: Home / Self Care | Payer: Medicare Other | Attending: Internal Medicine | Admitting: Internal Medicine

## 2023-07-07 ENCOUNTER — Encounter (HOSPITAL_COMMUNITY)
Admission: RE | Disposition: A | Discharge status: Home / Self Care | Payer: Self-pay | Source: Home / Self Care | Attending: Internal Medicine

## 2023-07-07 ENCOUNTER — Other Ambulatory Visit: Payer: Self-pay

## 2023-07-07 DIAGNOSIS — Z992 Dependence on renal dialysis: Secondary | ICD-10-CM | POA: Diagnosis not present

## 2023-07-07 DIAGNOSIS — N186 End stage renal disease: Secondary | ICD-10-CM | POA: Diagnosis not present

## 2023-07-07 DIAGNOSIS — T82898A Other specified complication of vascular prosthetic devices, implants and grafts, initial encounter: Secondary | ICD-10-CM

## 2023-07-07 DIAGNOSIS — Y832 Surgical operation with anastomosis, bypass or graft as the cause of abnormal reaction of the patient, or of later complication, without mention of misadventure at the time of the procedure: Secondary | ICD-10-CM | POA: Diagnosis not present

## 2023-07-07 DIAGNOSIS — T82858A Stenosis of vascular prosthetic devices, implants and grafts, initial encounter: Secondary | ICD-10-CM | POA: Diagnosis present

## 2023-07-07 DIAGNOSIS — I120 Hypertensive chronic kidney disease with stage 5 chronic kidney disease or end stage renal disease: Secondary | ICD-10-CM | POA: Insufficient documentation

## 2023-07-07 DIAGNOSIS — I480 Paroxysmal atrial fibrillation: Secondary | ICD-10-CM | POA: Insufficient documentation

## 2023-07-07 DIAGNOSIS — E1122 Type 2 diabetes mellitus with diabetic chronic kidney disease: Secondary | ICD-10-CM | POA: Diagnosis not present

## 2023-07-07 DIAGNOSIS — H9190 Unspecified hearing loss, unspecified ear: Secondary | ICD-10-CM | POA: Insufficient documentation

## 2023-07-07 HISTORY — PX: PERIPHERAL VASCULAR BALLOON ANGIOPLASTY: CATH118281

## 2023-07-07 HISTORY — PX: A/V FISTULAGRAM: CATH118298

## 2023-07-07 LAB — GLUCOSE, CAPILLARY
Glucose-Capillary: 52 mg/dL — ABNORMAL LOW (ref 70–99)
Glucose-Capillary: 74 mg/dL (ref 70–99)
Glucose-Capillary: 92 mg/dL (ref 70–99)

## 2023-07-07 SURGERY — A/V FISTULAGRAM
Anesthesia: LOCAL | Laterality: Right

## 2023-07-07 MED ORDER — SODIUM CHLORIDE 0.9% FLUSH: 10.0000 mL | Freq: Two times a day (BID) | INTRAVENOUS | Status: DC

## 2023-07-07 MED ORDER — LIDOCAINE HCL (PF) 1 % IJ SOLN
INTRAMUSCULAR | Status: AC
  Filled 2023-07-07: qty 30

## 2023-07-07 MED ORDER — FENTANYL CITRATE (PF) 100 MCG/2ML IJ SOLN
INTRAMUSCULAR | Status: DC | PRN
  Administered 2023-07-07: 50 ug via INTRAVENOUS

## 2023-07-07 MED ORDER — IODIXANOL 320 MG/ML IV SOLN
INTRAVENOUS | Status: DC | PRN
  Administered 2023-07-07: 8 mL

## 2023-07-07 MED ORDER — DEXTROSE 50 % IV SOLN
INTRAVENOUS | Status: AC
  Administered 2023-07-07: 25 g via INTRAMUSCULAR
  Filled 2023-07-07: qty 50

## 2023-07-07 MED ORDER — FENTANYL CITRATE (PF) 100 MCG/2ML IJ SOLN
INTRAMUSCULAR | Status: AC
  Filled 2023-07-07: qty 2

## 2023-07-07 MED ORDER — MIDAZOLAM HCL 2 MG/2ML IJ SOLN
INTRAMUSCULAR | Status: AC
  Filled 2023-07-07: qty 2

## 2023-07-07 MED ORDER — MIDAZOLAM HCL 2 MG/2ML IJ SOLN
INTRAMUSCULAR | Status: DC | PRN
  Administered 2023-07-07: 1 mg via INTRAVENOUS

## 2023-07-07 MED ORDER — HEPARIN (PORCINE) IN NACL 1000-0.9 UT/500ML-% IV SOLN
INTRAVENOUS | Status: DC | PRN
  Administered 2023-07-07: 500 mL

## 2023-07-07 MED ORDER — LIDOCAINE HCL (PF) 1 % IJ SOLN
INTRAMUSCULAR | Status: DC | PRN
  Administered 2023-07-07: 5 mL

## 2023-07-07 SURGICAL SUPPLY — 7 items
BALLN MUSTANG 8X80X75 (BALLOONS) ×2
BALLOON MUSTANG 8X80X75 (BALLOONS) ×1 IMPLANT
COVER DOME SNAP 22 D (MISCELLANEOUS) ×1 IMPLANT
GUIDEWIRE ANGLED .035X150CM (WIRE) ×1 IMPLANT
KIT MICROPUNCTURE NIT STIFF (SHEATH) ×1 IMPLANT
SHEATH PINNACLE R/O II 6F 4CM (SHEATH) ×1 IMPLANT
SYR MEDALLION 10ML (SYRINGE) ×1 IMPLANT

## 2023-07-07 NOTE — H&P (Signed)
[facility]  Vascular Access H&P  HPI: John Woodard is an 52 y.o. male with ESRD on HD [place] [facility], HTN, DM, h/o atrial fibrillation, hard of hearing who presents for difficulty with AVF.   Patient was unable to dialyze when presented for last dialysis due to difficult cannulations and pulling clots.  He is a poor historian but notes when the fistula aneurysm is cannulated in a specific direction there are issues.    In the pre op area the BG was noted to be 52.  An IV was inserted and 25g of dextrose was given.  His BG improved to 74.  He denied symptoms to me.  He has a h/o DM but I do not see insulin on his med list.   PMH: Past Medical History:  Diagnosis Date   Anemia    Constipation    ESRD    HD TTS   ESRD on dialysis [facility])    Hemo [place] T, Th , Sat   GERD (gastroesophageal reflux disease)    Hard of hearing    Hypertension    PAF (paroxysmal atrial fibrillation) (HCC)    PSH: Past Surgical History:  Procedure Laterality Date   A/V SHUNTOGRAM Left 04/03/2017   Procedure: A/V Shuntogram;  Surgeon: Sherren Kerns, MD;  Location: [facility] INVASIVE CV LAB;  Service: Cardiovascular;  Laterality: Left;   AV FISTULA PLACEMENT Right 09/19/2020   Procedure: RIGHT UPPER EXTREMITY FIRST STAGE BRACHIOCEPHALIC ARTERIOVENOUS (AV) FISTULA CREATION;  Surgeon: Nada Libman, MD;  Location: [facility] OR;  Service: Vascular;  Laterality: Right;   AVF Left    BASCILIC VEIN TRANSPOSITION Left 04/09/2018   Procedure: FIRST STAGE BACILIC VEIN TRANSPOSITION LEFT ARM;  Surgeon: Nada Libman, MD;  Location: [facility] OR;  Service: Vascular;  Laterality: Left;   BIOPSY  10/22/2021   Procedure: BIOPSY;  Surgeon: Beverley Fiedler, MD;  Location: [facility] ENDOSCOPY;  Service: Gastroenterology;;   COLONOSCOPY  2018   COLONOSCOPY WITH PROPOFOL N/A 10/23/2021   Procedure: COLONOSCOPY WITH PROPOFOL;  Surgeon: Beverley Fiedler, MD;  Location: [facility] ENDOSCOPY;  Service: Gastroenterology;  Laterality: N/A;    ESOPHAGOGASTRODUODENOSCOPY (EGD) WITH PROPOFOL N/A 10/22/2021   Procedure: ESOPHAGOGASTRODUODENOSCOPY (EGD) WITH PROPOFOL;  Surgeon: Beverley Fiedler, MD;  Location: [facility] ENDOSCOPY;  Service: Gastroenterology;  Laterality: N/A;   FISTULA SUPERFICIALIZATION Left 06/09/2018   Procedure: FISTULA SUPERFICIALIZATION LEFT UPPER ARM;  Surgeon: Nada Libman, MD;  Location: [facility] OR;  Service: Vascular;  Laterality: Left;   FISTULOGRAM Left 07/16/2017   Procedure: FISTULOGRAM LEFT ARM ARTERIOVENOUS FISTULA;  Surgeon: Nada Libman, MD;  Location: [facility] OR;  Service: Vascular;  Laterality: Left;   FLEXIBLE SIGMOIDOSCOPY  10/2016   PERIPHERAL VASCULAR BALLOON ANGIOPLASTY Left 04/03/2017   Procedure: PERIPHERAL VASCULAR BALLOON ANGIOPLASTY;  Surgeon: Sherren Kerns, MD;  [facility] INVASIVE CV LAB;  Service: Cardiovascular;  Laterality: Left;  Arm AV fistula   POLYPECTOMY  10/22/2021   Procedure: POLYPECTOMY;  Surgeon: Beverley Fiedler, MD;  Location: [facility] ENDOSCOPY;  Service: Gastroenterology;;   REVISON OF ARTERIOVENOUS FISTULA Left 07/16/2017   Procedure: PLICATION  OF ARTERIOVENOUS FISTULA  LEFT ARM;  Surgeon: Nada Libman, MD;  Location: [facility] OR;  Service: Vascular;  Laterality: Left;   VASCULAR SURGERY       Past Medical History:  Diagnosis Date   Anemia    Constipation    ESRD    HD TTS   ESRD on dialysis [facility])    Hemo [address]  T, Th , Sat   GERD (gastroesophageal reflux disease)    Hard of hearing    Hypertension    PAF (paroxysmal atrial fibrillation) (HCC)     Medications:  I have reviewed the patient's current medications.  Medications Prior to Admission  Medication Sig Dispense Refill   amLODipine (NORVASC) 5 MG tablet Take 10 mg by mouth at bedtime.     cinacalcet (SENSIPAR) 30 MG tablet Take 30 mg by mouth 3 (three) times a week. Tues, Thurs, Sat     cloNIDine (CATAPRES) 0.3 MG tablet Take 1 tablet (0.3 mg total) by mouth 3 (three) times daily. 90 tablet 0   esomeprazole  (NEXIUM) 40 MG capsule Take 40 mg by mouth daily before breakfast.     furosemide (LASIX) 40 MG tablet Take 40 mg by mouth daily. (Patient not taking: Reported on 06/26/2023)     hydrALAZINE (APRESOLINE) 25 MG tablet Take 25 mg by mouth 2 (two) times daily.     losartan (COZAAR) 100 MG tablet Take 100 mg by mouth daily.     metoprolol succinate (TOPROL-XL) 50 MG 24 hr tablet Take 1 tablet (50 mg total) by mouth at bedtime. Take with or immediately following a meal. 30 tablet 0   multivitamin (RENA-VIT) TABS tablet Take 1 tablet by mouth at bedtime.     sevelamer carbonate (RENVELA) 800 MG tablet Take 800-1,600 mg by mouth in the morning, at noon, in the evening, and at bedtime. Take 2 tablets (1600 mg) three times daily with meals and 1 tablet (800 mg) with one snack.      ALLERGIES:   Allergies  Allergen Reactions   Naproxen Anaphylaxis and Other (See Comments)    CARDIAC ARREST- "heart stopped one time"   Lactose Intolerance (Gi) Diarrhea and Other (See Comments)    BLOODY STOOLS   Penicillins Other (See Comments)    UNSPECIFIED REACTION     FAM HX: Family History  Problem Relation Age of Onset   Hypertension Mother    Diabetes Mellitus II Sister     Social History:   reports that he has never smoked. He has never been exposed to tobacco smoke. He has never used smokeless tobacco. He reports that he does not drink alcohol and does not use drugs.  ROS: 12 system ROS per HPI above; denies f/c/chest pain/dyspnea  Blood pressure (!) 198/127, pulse 84, resp. rate 14, SpO2 97%. PHYSICAL EXAM: Gen: well appearing man on stretcher  Eyes: anicteric CV: RRR on monitor Lungs: clear ant Extr: R RC AVF with thrill and bruit.  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  On ultrasound there is no thrombus or distinct stenosis but the aneurysm is somewhat firm on palpation.  Augmentation is normal Neuro: AOx3 but unable to provide detailed answers to questions - this is his baseline     Results for orders placed or performed during the hospital encounter of 07/07/23 (from the past 48 hours)  Glucose, capillary     Status: Abnormal   Collection Time: 07/07/23 10:13 AM  Result Value Ref Range   Glucose-Capillary 52 (L) 70 - 99 mg/dL    Comment: Glucose reference range applies only to samples taken after fasting for at least 8 hours.  Glucose, capillary     Status: None   Collection Time: 07/07/23 10:38 AM  Result Value Ref Range   Glucose-Capillary 74 70 - 99 mg/dL    Comment: Glucose reference range applies only to samples taken after fasting for at least  8 hours.    No results found.  Assessment/PlanTimothy Gopalan is an 52 y.o. male with ESRD on HD [place], HTN, h/o atrial fibrillation, hard of hearing who presents for difficulty with AVF.   **AV fistula dysfunction:  ultrasound and physical exam do not suggest a major issue but given unable to dialyze yesterday a diagnostic angiogram is reasonable.  If there is a significant stenosis or other issue identified will plan do angioplasty or formulate appropriate plan.  If angiogram is normal will return patient to dialysis with recommendation to use expert cannulator.   Initiate case with local anesthesia only, if need for intervention will proceed to conscious sedation.   **hypoglycemia:  pt in BG 54 in pre op, treated with dextrose.  Will recheck BG prior to discharge.    Tyler Pita 07/07/2023, 10:49 AM

## 2023-07-07 NOTE — Progress Notes (Signed)
Patient not responding properly to questions. Blood sugar taken, 52 mg/ dl. IV access obtained in right upper arm with approval of DR Glenna Fellows. AMP of D50 given. Blood sugar improved to 74 mg/ dl. IV access discontinued.

## 2023-07-07 NOTE — Op Note (Signed)
    Patient name: John Woodard MRN: [id number] DOB: Jun 19, 1971 Sex: male  07/07/2023 Pre-operative Diagnosis: End-stage renal disease, malfunction right arm AV fistula  Post-operative diagnosis:  Same Surgeon:  Luanna Salk. Randie Heinz, MD Co-surgeon: Estill Bakes, MD Procedure Performed: 1.  Right upper extremity fistulogram 2.  Balloon angioplasty right cephalic vein AV fistula with 8 x 80 mm balloon 3.  Moderate sedation with fentanyl and Versed for 4 minutes  Indications: 52 year old male on dialysis via right arm AV fistula.  He has had difficulty with cannulation and pulling clots and is now indicated for fistulogram with possible intervention.  Findings: Fistula gives rise to a large cephalic with stenosis near the antecubitum and a very large basilic that does not have any stenosis.  The greater than 70% stenosis of the cephalic just above the antecubital was treated with a millimeter balloon to 0% residual stenosis.    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  If he has further issues with swelling of the right upper extremity, face or difficulty with dialysis we can consider innominate vein revascularization from the right upper extremity and right femoral artery.   Procedure:  The patient was identified in the holding area and taken to room 8.  The patient was then placed supine on the table and prepped and draped in the usual sterile fashion.  A time out was called.  The right arm AV fistula was cannulated with a micropuncture needle followed by wire and a sheath and a 6 French sheath was placed.  Right upper extremity fistulogram was performed with the above findings.  The placed an 8 mm balloon across the cephalic vein and balloon this to 0% residual stenosis.  Given the patient does not have symptoms of innominate vein occlusion this was not treated at the time but can be addressed at a later date if necessary.  Contrast: 8 cc   Rebecah Dangerfield C. Randie Heinz,  MD [facility] Office: [phone] Pager: [phone]

## 2023-07-08 ENCOUNTER — Encounter (HOSPITAL_COMMUNITY): Payer: Self-pay | Admitting: Vascular Surgery

## 2023-08-05 ENCOUNTER — Ambulatory Visit (HOSPITAL_COMMUNITY): Payer: Medicare Other | Attending: Cardiology

## 2023-08-05 DIAGNOSIS — I4819 Other persistent atrial fibrillation: Secondary | ICD-10-CM | POA: Diagnosis present

## 2023-08-05 LAB — ECHOCARDIOGRAM COMPLETE
Area-P 1/2: 4.54 cm2
P 1/2 time: 419 ms
S' Lateral: 2.2 cm

## 2023-10-07 ENCOUNTER — Ambulatory Visit: Admitting: Family Medicine

## 2023-10-09 ENCOUNTER — Ambulatory Visit: Admitting: Family Medicine

## 2023-10-14 ENCOUNTER — Ambulatory Visit: Payer: Medicare Other | Admitting: Nurse Practitioner

## 2023-10-19 ENCOUNTER — Encounter: Payer: Self-pay | Admitting: Internal Medicine

## 2023-10-19 ENCOUNTER — Ambulatory Visit (INDEPENDENT_AMBULATORY_CARE_PROVIDER_SITE_OTHER): Payer: Medicare Other | Admitting: Internal Medicine

## 2023-10-19 VITALS — BP 138/82 | HR 67 | Temp 98.8°F | Resp 16 | Ht 74.0 in | Wt 184.0 lb

## 2023-10-19 DIAGNOSIS — I150 Renovascular hypertension: Secondary | ICD-10-CM

## 2023-10-19 DIAGNOSIS — I4819 Other persistent atrial fibrillation: Secondary | ICD-10-CM

## 2023-10-19 DIAGNOSIS — D696 Thrombocytopenia, unspecified: Secondary | ICD-10-CM

## 2023-10-19 DIAGNOSIS — E1142 Type 2 diabetes mellitus with diabetic polyneuropathy: Secondary | ICD-10-CM

## 2023-10-19 NOTE — Patient Instructions (Signed)
 Atrial Fibrillation Atrial fibrillation (AFib) is a type of irregular or rapid heartbeat (arrhythmia). In AFib, the top part of the heart (atria) beats in an irregular pattern. This makes the heart unable to pump blood normally and effectively. The goal of treatment is to prevent blood clots from forming, control your heart rate, or restore your heartbeat to a normal rhythm. If this condition is not treated, it can cause serious problems, such as a weakened heart muscle (cardiomyopathy) or a stroke. What are the causes? This condition is often caused by medical conditions that damage the heart's electrical system. These include: High blood pressure (hypertension). This is the most common cause. Certain heart problems or conditions, such as heart failure, coronary artery disease, heart valve problems, or heart surgery. Diabetes. Overactive thyroid (hyperthyroidism). Chronic kidney disease. Certain lung conditions, such as emphysema, pneumonia, or COPD. Obstructive sleep apnea. In some cases, the cause of this condition is not known. What increases the risk? This condition is more likely to develop in: Older adults. Athletes who do endurance exercise. People who have a family history of AFib. Males. People who are Caucasian. People who are obese. People who smoke or misuse alcohol. What are the signs or symptoms? Symptoms of this condition include: Fast or irregular heartbeats (palpitations). Discomfort or pain in your chest. Shortness of breath. Sudden light-headedness or weakness. Tiring easily during exercise or activity. Syncope (fainting). Sweating. In some cases, there are no symptoms. How is this diagnosed? Your health care provider may detect AFib when taking your pulse. If detected, this condition may be diagnosed with: An electrocardiogram (ECG) to check electrical signals of the heart. An ambulatory cardiac monitor to record your heart's activity for a few days. A  transthoracic echocardiogram (TTE) to create pictures of your heart. A transesophageal echocardiogram (TEE) to create even clearer pictures of your heart. A stress test to check your blood supply while you exercise. Imaging tests, such as a CT scan or chest X-ray. Blood tests. How is this treated? Treatment depends on underlying conditions and how you feel when you get AFib. This condition may be treated with: Medicines to prevent blood clots or to treat heart rate or heart rhythm problems. Electrical cardioversion to reset the heart's rhythm. A pacemaker to correct abnormal heart rhythm. Ablation to remove the heart tissue that sends abnormal signals. Left atrial appendage closure to seal the area where blood clots can form. In some cases, underlying conditions will be treated. Follow these instructions at home: Medicines Take over-the counter and prescription medicines only as told by your provider. Do not take any new medicines without talking to your provider. If you are taking blood thinners: Talk with your provider before taking aspirin or NSAIDs. These medicines can raise your risk of bleeding. Take your medicines as told. Take them at the same time each day. Do not do things that could hurt or bruise you. Be careful to avoid falls. Wear an alert bracelet or carry a card that says that you take blood thinners. Lifestyle Do not use any products that contain nicotine or tobacco. These products include cigarettes, chewing tobacco, and vaping devices, such as e-cigarettes. If you need help quitting, ask your provider. Eat heart-healthy foods. Talk with a food expert (dietitian) to make an eating plan that is right for you. Exercise regularly as told by your provider. Do not drink alcohol. Lose weight if you are overweight. General instructions If you have obstructive sleep apnea, manage your condition as told by your provider.  Do not use diet pills unless your provider approves. Diet  pills can make heart problems worse. Keep all follow-up visits. Your provider will want to check your heart rate and rhythm regularly. Contact a health care provider if: You notice a change in the rate, rhythm, or strength of your heartbeat. You are taking a blood thinner and you notice more bruising. You tire more easily when you exercise or do heavy work. You have a sudden change in weight. Get help right away if:  You have chest pain. You have trouble breathing. You have side effects of blood thinners, such as blood in your vomit, poop (stool), or pee (urine), or bleeding that does not stop. You have any symptoms of a stroke. "BE FAST" is an easy way to remember the main warning signs of a stroke: B - Balance. Signs are dizziness, sudden trouble walking, or loss of balance. E - Eyes. Signs are trouble seeing or a sudden change in vision. F - Face. Signs are sudden weakness or numbness of the face, or the face or eyelid drooping on one side. A - Arms. Signs are weakness or numbness in an arm. This happens suddenly and usually on one side of the body. S - Speech.Signs are sudden trouble speaking, slurred speech, or trouble understanding what people say. T - Time. Time to call emergency services. Write down what time symptoms started. Other signs of a stroke, such as: A sudden, severe headache with no known cause. Nausea or vomiting. Seizure. These symptoms may be an emergency. Get help right away. Call 911. Do not wait to see if the symptoms will go away. Do not drive yourself to the hospital. This information is not intended to replace advice given to you by your health care provider. Make sure you discuss any questions you have with your health care provider. Document Revised: 03/26/2022 Document Reviewed: 03/26/2022 Elsevier Patient Education  2024 [employer].

## 2023-10-19 NOTE — Progress Notes (Signed)
 Subjective:  Patient ID: John Woodard, male    DOB: 08-27-1970  Age: 53 y.o. MRN: [id number]  CC: Hypertension and Atrial Fibrillation   HPI John Woodard presents for f/up ----  He utters very few words that can't be understood.  Outpatient Medications Prior to Visit  Medication Sig Dispense Refill   amLODipine (NORVASC) 5 MG tablet Take 10 mg by mouth at bedtime.     cinacalcet (SENSIPAR) 30 MG tablet Take 30 mg by mouth 3 (three) times a week. Tues, Thurs, Sat     cloNIDine (CATAPRES) 0.3 MG tablet Take 1 tablet (0.3 mg total) by mouth 3 (three) times daily. 90 tablet 0   Doxercalciferol (HECTOROL IV) Doxercalciferol (Hectorol)     esomeprazole (NEXIUM) 40 MG capsule Take 40 mg by mouth daily before breakfast.     furosemide (LASIX) 40 MG tablet Take 40 mg by mouth daily.     hydrALAZINE (APRESOLINE) 25 MG tablet Take 25 mg by mouth 2 (two) times daily.     losartan (COZAAR) 100 MG tablet Take 100 mg by mouth daily.     Methoxy PEG-Epoetin Beta (MIRCERA IJ) Mircera     metoprolol succinate (TOPROL-XL) 50 MG 24 hr tablet Take 1 tablet (50 mg total) by mouth at bedtime. Take with or immediately following a meal. 30 tablet 0   multivitamin (RENA-VIT) TABS tablet Take 1 tablet by mouth at bedtime.     sevelamer carbonate (RENVELA) 800 MG tablet Take 800-1,600 mg by mouth in the morning, at noon, in the evening, and at bedtime. Take 2 tablets (1600 mg) three times daily with meals and 1 tablet (800 mg) with one snack.     No facility-administered medications prior to visit.    ROS Review of Systems  Constitutional:  Positive for unexpected weight change (wt gain).  Respiratory:  Negative for cough.   Cardiovascular:  Positive for leg swelling.  Genitourinary: Negative.   Musculoskeletal: Negative.   Skin: Negative.   Psychiatric/Behavioral:  Positive for confusion and decreased concentration. Negative for behavioral problems and sleep disturbance. The patient is not  nervous/anxious.     Objective:  BP 138/82 (BP Location: Right Arm, Patient Position: Sitting, Cuff Size: Normal)   Pulse 67   Temp 98.8 F (37.1 C) (Oral)   Resp 16   Ht 6\' 2"  (1.88 m)   Wt 184 lb (83.5 kg)   SpO2 98%   BMI 23.62 kg/m   BP Readings from Last 3 Encounters:  10/19/23 138/82  07/07/23 (!) 199/133  06/26/23 (!) 140/84    Wt Readings from Last 3 Encounters:  10/19/23 184 lb (83.5 kg)  06/26/23 179 lb 6.4 oz (81.4 kg)  04/01/23 178 lb (80.7 kg)    Physical Exam Vitals reviewed.  Constitutional:      General: He is not in acute distress.    Appearance: He is ill-appearing. He is not toxic-appearing or diaphoretic.  HENT:     Mouth/Throat:     Mouth: Mucous membranes are moist.  Eyes:     General: No scleral icterus.    Conjunctiva/sclera: Conjunctivae normal.  Cardiovascular:     Rate and Rhythm: Normal rate. Rhythm irregularly irregular.     Heart sounds: Murmur heard.     Systolic murmur is present with a grade of 2/6.     No diastolic murmur is present.     Comments: EKG- A fib, 69 bpm LAD Septal infarct pattern Unchanged Pulmonary:     Effort: Pulmonary effort is  normal.     Breath sounds: No stridor. No wheezing, rhonchi or rales.  Abdominal:     General: Abdomen is flat.     Palpations: There is no mass.     Tenderness: There is no abdominal tenderness. There is no guarding.     Hernia: No hernia is present.  Musculoskeletal:     Cervical back: Neck supple.     Right lower leg: 1+ Pitting Edema present.     Left lower leg: 1+ Pitting Edema present.     Comments: AV fistulas   Lymphadenopathy:     Cervical: No cervical adenopathy.  Neurological:     General: No focal deficit present.     Mental Status: He is alert and oriented to person, place, and time.     Lab Results  Component Value Date   WBC 5.2 10/22/2021   HGB 10.9 (L) 10/23/2021   HCT 32.0 (L) 10/23/2021   PLT 75 (L) 10/22/2021   GLUCOSE 85 10/23/2021   ALT 16  10/19/2021   AST 20 10/19/2021   NA 138 10/23/2021   K 4.5 10/23/2021   CL 99 10/23/2021   CREATININE 10.50 (H) 10/23/2021   BUN 25 (H) 10/23/2021   CO2 24 10/22/2021   TSH 2.291 10/19/2021   INR 1.0 10/21/2021   HGBA1C 4.3 08/24/2020    PERIPHERAL VASCULAR CATHETERIZATION Result Date: 07/07/2023 Images from the original result were not included. Patient name: John Woodard MRN: [id number] DOB: 1971/03/08 Sex: male 07/07/2023 Pre-operative Diagnosis: End-stage renal disease, malfunction right arm AV fistula Post-operative diagnosis:  Same Surgeon:  Luanna Salk. Randie Heinz, MD Co-surgeon: Estill Bakes, MD Procedure Performed: 1.  Right upper extremity fistulogram 2.  Balloon angioplasty right cephalic vein AV fistula with 8 x 80 mm balloon 3.  Moderate sedation with fentanyl and Versed for 4 minutes Indications: 53 year old male on dialysis via right arm AV fistula.  He has had difficulty with cannulation and pulling clots and is now indicated for fistulogram with possible intervention. Findings: Fistula gives rise to a large cephalic with stenosis near the antecubitum and a very large basilic that does not have any stenosis.  The greater than 70% stenosis of the cephalic just above the antecubital was treated with a millimeter balloon to 0% residual stenosis.  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  If he has further issues with swelling of the right upper extremity, face or difficulty with dialysis we can consider innominate vein revascularization from the right upper extremity and right femoral artery.  Procedure:  The patient was identified in the holding area and taken to room 8.  The patient was then placed supine on the table and prepped and draped in the usual sterile fashion.  A time out was called.  The right arm AV fistula was cannulated with a micropuncture needle followed by wire and a sheath and a 6 French sheath was placed.  Right upper extremity fistulogram  was performed with the above findings.  The placed an 8 mm balloon across the cephalic vein and balloon this to 0% residual stenosis.  Given the patient does not have symptoms of innominate vein occlusion this was not treated at the time but can be addressed at a later date if necessary. Contrast: 8 cc Brandon C. 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 -     Basic  metabolic panel with GFR; Future -     Hepatic function panel; Future  Persistent atrial fibrillation (HCC)- He has good rare control. -     TSH; Future -     Hepatic function panel; Future -     EKG 12-Lead  Thrombocytopenia (HCC) -     CBC with Differential/Platelet; Future -     Hepatic function panel; Future  Diabetic polyneuropathy associated with type 2 diabetes mellitus (HCC) -     Hemoglobin A1c; Future     Follow-up: Return in about 6 months (around 04/19/2024).  Sanda Linger, MD

## 2023-10-21 ENCOUNTER — Emergency Department (HOSPITAL_COMMUNITY)
Admission: EM | Admit: 2023-10-21 | Discharge: 2023-10-21 | Disposition: A | Discharge status: Home / Self Care | Attending: Emergency Medicine | Admitting: Emergency Medicine

## 2023-10-21 ENCOUNTER — Other Ambulatory Visit: Payer: Self-pay

## 2023-10-21 ENCOUNTER — Encounter (HOSPITAL_COMMUNITY): Payer: Self-pay

## 2023-10-21 DIAGNOSIS — N186 End stage renal disease: Secondary | ICD-10-CM | POA: Insufficient documentation

## 2023-10-21 DIAGNOSIS — Z79899 Other long term (current) drug therapy: Secondary | ICD-10-CM | POA: Insufficient documentation

## 2023-10-21 DIAGNOSIS — Z992 Dependence on renal dialysis: Secondary | ICD-10-CM | POA: Insufficient documentation

## 2023-10-21 DIAGNOSIS — H6123 Impacted cerumen, bilateral: Secondary | ICD-10-CM | POA: Insufficient documentation

## 2023-10-21 DIAGNOSIS — I120 Hypertensive chronic kidney disease with stage 5 chronic kidney disease or end stage renal disease: Secondary | ICD-10-CM | POA: Insufficient documentation

## 2023-10-21 DIAGNOSIS — H9193 Unspecified hearing loss, bilateral: Secondary | ICD-10-CM

## 2023-10-21 DIAGNOSIS — H9202 Otalgia, left ear: Secondary | ICD-10-CM | POA: Diagnosis present

## 2023-10-21 MED ORDER — DOCUSATE SODIUM 50 MG/5ML PO LIQD
50.0000 mg | Freq: Once | ORAL | Status: AC
  Administered 2023-10-21: 50 mg via OTIC
  Filled 2023-10-21: qty 10

## 2023-10-21 NOTE — ED Triage Notes (Signed)
 Pt here for throat and left ear pain that started last night. Pt unable to hear.

## 2023-10-21 NOTE — Discharge Instructions (Addendum)
 Use ear wax removal kits that can soften the wax in your ears.  Follow up tomorrow at 1pm at the office listed above

## 2023-10-21 NOTE — ED Provider Notes (Signed)
 [place] EMERGENCY DEPARTMENT AT [facility] Provider Note   CSN: [id number] Arrival date & time: 10/21/23  1022     History  Chief Complaint  Patient presents with   Otalgia    Zak Gondek is a 53 y.o. male.  Patient is a 53 year old male who presents with discomfort in his ears.  On chart review, he has a history of hypertension, end-stage renal disease on dialysis Tuesday Thursday Saturday, paroxysmal atrial fibrillation (I don't see anticoagulation on his med list).  He presents with stopped up ears.  He has some pain in his left ear that it has been going on since yesterday.  He denies any fevers.  No runny nose or congestion.  He does say that he did dialysis yesterday.  No shortness of breath.  He indicates that he tried to go to Dr. Jenne Pane ENT office today but does not think that he actually had an appointment.  He was sent here to the emergency room from there.  He said he needs information about medications he can use to unclog his ears.  Of note, patient seems to be extremely hard of hearing.  He does not answer questions appropriately when I was verbalizing this.  However when I write questions down, he answers them appropriately and is oriented.  I asked him if he was deaf and he says no that he just has a hard time hearing.  He says this has been going on for many years.       Home Medications Prior to Admission medications   Medication Sig Start Date End Date Taking? Authorizing Provider  amLODipine (NORVASC) 5 MG tablet Take 10 mg by mouth at bedtime. 07/06/20   [provider]  cinacalcet (SENSIPAR) 30 MG tablet Take 30 mg by mouth 3 (three) times a week. Tues, Thurs, Sat    [provider]  cloNIDine (CATAPRES) 0.3 MG tablet Take 1 tablet (0.3 mg total) by mouth 3 (three) times daily. 04/07/17   Glade Lloyd, MD  Doxercalciferol (HECTOROL IV) Doxercalciferol (Hectorol) 07/16/23 07/14/24  [provider]  esomeprazole (NEXIUM) 40  MG capsule Take 40 mg by mouth daily before breakfast.    [provider]  furosemide (LASIX) 40 MG tablet Take 40 mg by mouth daily.    [provider]  hydrALAZINE (APRESOLINE) 25 MG tablet Take 25 mg by mouth 2 (two) times daily. 08/22/21   [provider]  losartan (COZAAR) 100 MG tablet Take 100 mg by mouth daily.    [provider]  Methoxy PEG-Epoetin Beta (MIRCERA IJ) Mircera 07/07/23 07/05/24  [provider]  metoprolol succinate (TOPROL-XL) 50 MG 24 hr tablet Take 1 tablet (50 mg total) by mouth at bedtime. Take with or immediately following a meal. 04/07/17   Glade Lloyd, MD  multivitamin (RENA-VIT) TABS tablet Take 1 tablet by mouth at bedtime.    [provider]  sevelamer carbonate (RENVELA) 800 MG tablet Take 800-1,600 mg by mouth in the morning, at noon, in the evening, and at bedtime. Take 2 tablets (1600 mg) three times daily with meals and 1 tablet (800 mg) with one snack.    [provider]      Allergies    Naproxen, Lactose intolerance (gi), and Penicillins    Review of Systems   Review of Systems  Constitutional:  Negative for fatigue and fever.  HENT:  Positive for ear pain. Negative for congestion, ear discharge and rhinorrhea.   Respiratory:  Negative for  shortness of breath.   Cardiovascular:  Negative for chest pain.  Gastrointestinal:  Negative for nausea and vomiting.  Neurological:  Negative for headaches.    Physical Exam Updated Vital Signs BP (!) 179/116 (BP Location: Right Arm)   Pulse 98   Temp 99 F (37.2 C) (Oral)   Resp 17   SpO2 100%  Physical Exam HENT:     Ears:     Comments: Cerumen impactions bilaterally    Nose: Nose normal.     Mouth/Throat:     Mouth: Mucous membranes are moist.     Pharynx: No oropharyngeal exudate or posterior oropharyngeal erythema.  Cardiovascular:     Rate and Rhythm: Normal rate.  Pulmonary:     Effort: Pulmonary effort is normal.   Neurological:     General: No focal deficit present.     Mental Status: He is alert and oriented to person, place, and time.     ED Results / Procedures / Treatments   Labs (all labs ordered are listed, but only abnormal results are displayed) Labs Reviewed - No data to display  EKG None  Radiology No results found.  Procedures Procedures    Medications Ordered in ED Medications  docusate (COLACE) 50 MG/5ML liquid 50 mg (50 mg Both EARS Given 10/21/23 1154)    ED Course/ Medical Decision Making/ A&P                                 Medical Decision Making Risk OTC drugs.   Patient presents with some discomfort and fullness in his left ear.  He appears to have a cerumen impaction in that area as well as a lot of wax in his right ear.  He does not have any rhinorrhea or fevers.  He seems to have some profound hearing loss which he indicates has been going on for a number of years.  Colace was put in the ears and the ears were irrigated.  He tried to go to an ENT this morning but did not actually have an appointment.  I called the ENT office and got him an appointment for tomorrow at 1 PM.  He does have dialysis in the morning.  He states he may call and try to change it to Monday.  Information was given on his discharge paperwork regarding the address and contact information.  He was discharged home in good condition.  Will follow-up with the ENT tomorrow.  Return precautions were given.  Final Clinical Impression(s) / ED Diagnoses Final diagnoses:  Bilateral impacted cerumen  Bilateral hearing loss, unspecified hearing loss type    Rx / DC Orders ED Discharge Orders     None         Rolan Bucco, MD 10/21/23 1246

## 2023-10-26 DIAGNOSIS — H93293 Other abnormal auditory perceptions, bilateral: Secondary | ICD-10-CM | POA: Insufficient documentation

## 2023-10-26 DIAGNOSIS — H6122 Impacted cerumen, left ear: Secondary | ICD-10-CM | POA: Insufficient documentation

## 2023-12-25 ENCOUNTER — Ambulatory Visit

## 2023-12-25 ENCOUNTER — Ambulatory Visit (HOSPITAL_COMMUNITY)
Admission: RE | Admit: 2023-12-25 | Discharge: 2023-12-25 | Disposition: A | Discharge status: Home / Self Care | Attending: Surgery | Admitting: Surgery

## 2023-12-25 DIAGNOSIS — N186 End stage renal disease: Secondary | ICD-10-CM

## 2023-12-30 ENCOUNTER — Other Ambulatory Visit: Payer: Self-pay

## 2023-12-30 ENCOUNTER — Encounter (HOSPITAL_COMMUNITY)
Admission: RE | Disposition: A | Discharge status: Home / Self Care | Payer: Self-pay | Source: Home / Self Care | Attending: Vascular Surgery

## 2023-12-30 ENCOUNTER — Ambulatory Visit (HOSPITAL_COMMUNITY)
Admission: RE | Admit: 2023-12-30 | Discharge: 2023-12-30 | Disposition: A | Discharge status: Home / Self Care | Attending: Vascular Surgery | Admitting: Vascular Surgery

## 2023-12-30 DIAGNOSIS — I120 Hypertensive chronic kidney disease with stage 5 chronic kidney disease or end stage renal disease: Secondary | ICD-10-CM | POA: Insufficient documentation

## 2023-12-30 DIAGNOSIS — Y832 Surgical operation with anastomosis, bypass or graft as the cause of abnormal reaction of the patient, or of later complication, without mention of misadventure at the time of the procedure: Secondary | ICD-10-CM | POA: Diagnosis not present

## 2023-12-30 DIAGNOSIS — N186 End stage renal disease: Secondary | ICD-10-CM | POA: Diagnosis not present

## 2023-12-30 DIAGNOSIS — T82858A Stenosis of vascular prosthetic devices, implants and grafts, initial encounter: Secondary | ICD-10-CM | POA: Diagnosis present

## 2023-12-30 DIAGNOSIS — Z992 Dependence on renal dialysis: Secondary | ICD-10-CM

## 2023-12-30 DIAGNOSIS — T82898A Other specified complication of vascular prosthetic devices, implants and grafts, initial encounter: Secondary | ICD-10-CM

## 2023-12-30 DIAGNOSIS — I82291 Chronic embolism and thrombosis of other thoracic veins: Secondary | ICD-10-CM | POA: Insufficient documentation

## 2023-12-30 HISTORY — PX: A/V SHUNT INTERVENTION: CATH118220

## 2023-12-30 SURGERY — A/V SHUNT INTERVENTION
Anesthesia: LOCAL

## 2023-12-30 MED ORDER — HEPARIN (PORCINE) IN NACL 1000-0.9 UT/500ML-% IV SOLN
INTRAVENOUS | Status: DC | PRN
  Administered 2023-12-30: 500 mL

## 2023-12-30 MED ORDER — IODIXANOL 320 MG/ML IV SOLN
INTRAVENOUS | Status: DC | PRN
  Administered 2023-12-30: 30 mL

## 2023-12-30 MED ORDER — LIDOCAINE HCL (PF) 1 % IJ SOLN
INTRAMUSCULAR | Status: AC
  Filled 2023-12-30: qty 30

## 2023-12-30 SURGICAL SUPPLY — 10 items
BALLOON MUSTANG 8X60X75 (BALLOONS) IMPLANT
CATH ANGIO 5F BER2 65CM (CATHETERS) IMPLANT
CATH NAVICROSS ANGLED 90CM (MICROCATHETER) IMPLANT
GUIDEWIRE ANGLED .035 180CM (WIRE) IMPLANT
KIT MICROPUNCTURE NIT STIFF (SHEATH) IMPLANT
KIT PV (KITS) ×1 IMPLANT
SHEATH PINNACLE R/O II 7F 4CM (SHEATH) IMPLANT
SHEATH PROBE COVER 6X72 (BAG) IMPLANT
TRAY PV CATH (CUSTOM PROCEDURE TRAY) ×1 IMPLANT
TUBING CIL FLEX 10 FLL-RA (TUBING) IMPLANT

## 2023-12-30 NOTE — Op Note (Addendum)
    Patient name: Quantavious Eggert MRN: [id number] DOB: [birth year] Sex: male  12/30/2023 Pre-operative Diagnosis: ESRD on HD with low flows during sessions Post-operative diagnosis:  Same Surgeon:  Philipp Brawn, MD Procedure Performed:  Ultrasound guided access of right arm radiocephalic fistula Fistulogram and central venogram Third order cannulation of right subclavian vein Balloon angioplasty of right cephalic vein at the [facility] fossa, 8 x 60 mm Mustang   Indications: Mr. Delcid is a 53 year old male with ESRD on HD with a radiocephalic fistula who underwent intervention about 6 months ago with cephalic vein angioplasty.  He has a known innominate occlusion on that side with adequate collateralization.  Recently has been having low clearance and flows during dialysis sessions.  Risk and benefits of fistulogram with intervention were reviewed, he expressed understanding and elected to proceed.  Findings:  Widely patent right radiocephalic fistula, approximate 47% stenosis of the cephalic vein at the [facility] fossa.  Widely patent cephalic vein throughout the upper arm.  There is a chronically occluded innominate vein with excellent collateralization through the IJ and chest collaterals to the left central system.  Widely patent anastomosis   Procedure:  The patient was identified in the holding area and taken to the cath lab  The patient was then placed supine on the table and prepped and draped in the usual sterile fashion.  A time out was called.  Ultrasound was used to evaluate the right arm AV access. This was accessed under u/s guidance. An 018 wire was advanced without resistance, a micropuncture sheath was placed and fistulagram obtained which demonstrated the above findings.  This access was then upsized to a 20F short sheath over a glidewire.  Using a Glidewire and a Elmyra Haggard cross catheter I attempted to engage the innominate occlusion although this was a flush occlusion and I was unable to engage any  aspect of the disease.  Given that he had at great collateralization and had been receiving dialysis over the last 6 months without any issue I abandoned revascularization of his innominate vein occlusion.  The 50% stenosis of the cephalic vein at the [facility] fossa was then treated with an 8 mm x 60 mm.  Completion angiogram demonstrated an adequate result with less than 20% residual stenosis.  The wire and sheath were removed and the access was managed with a figure-of-eight 4-0 Monocryl for hemostasis.  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  Had been receiving dialysis over the last 6 months without any issue therefore I believe his collateralization is adequate to support HD sessions.   Philipp Brawn MD [facility] Office: [phone]

## 2023-12-30 NOTE — H&P (Addendum)
 HD ACCESS CENTER H&P   Patient ID: John Woodard, male   DOB: Mar 10, 1971, 53 y.o.   MRN: [id number]  Subjective:     HPI John Woodard is a 53 y.o. male with ESRD presenting to the HD access center for intervention.  Past Medical History:  Diagnosis Date   Anemia    Constipation    ESRD    HD TTS   ESRD on dialysis [facility])    [place]  T, Th , Sat   GERD (gastroesophageal reflux disease)    Hard of hearing    Hypertension    PAF (paroxysmal atrial fibrillation) (HCC)    Family History  Problem Relation Age of Onset   Hypertension Mother    Diabetes Mellitus II Sister    Past Surgical History:  Procedure Laterality Date   A/V FISTULAGRAM Right 07/07/2023   Procedure: A/V Fistulagram;  Surgeon: Adine Hoof, MD;  Location: [facility] INVASIVE CV LAB;  Service: Vascular;  Laterality: Right;   A/V SHUNTOGRAM Left 04/03/2017   Procedure: A/V Shuntogram;  Surgeon: Richrd Char, MD;  Location: [facility] INVASIVE CV LAB;  Service: Cardiovascular;  Laterality: Left;   AV FISTULA PLACEMENT Right 09/19/2020   Procedure: RIGHT UPPER EXTREMITY FIRST STAGE BRACHIOCEPHALIC ARTERIOVENOUS (AV) FISTULA CREATION;  Surgeon: Margherita Shell, MD;  Location: [facility] OR;  Service: Vascular;  Laterality: Right;   AVF Left    BASCILIC VEIN TRANSPOSITION Left 04/09/2018   Procedure: FIRST STAGE BACILIC VEIN TRANSPOSITION LEFT ARM;  Surgeon: Margherita Shell, MD;  Location: [facility] OR;  Service: Vascular;  Laterality: Left;   BIOPSY  10/22/2021   Procedure: BIOPSY;  Surgeon: Nannette Babe, MD;  Location: [facility] ENDOSCOPY;  Service: Gastroenterology;;   COLONOSCOPY  2018   COLONOSCOPY WITH PROPOFOL  N/A 10/23/2021   Procedure: COLONOSCOPY WITH PROPOFOL ;  Surgeon: Nannette Babe, MD;  Location: [facility] ENDOSCOPY;  Service: Gastroenterology;  Laterality: N/A;   ESOPHAGOGASTRODUODENOSCOPY (EGD) WITH PROPOFOL  N/A 10/22/2021   Procedure: ESOPHAGOGASTRODUODENOSCOPY (EGD) WITH PROPOFOL ;  Surgeon: Nannette Babe, MD;  Location:  [facility] ENDOSCOPY;  Service: Gastroenterology;  Laterality: N/A;   FISTULA SUPERFICIALIZATION Left 06/09/2018   Procedure: FISTULA SUPERFICIALIZATION LEFT UPPER ARM;  Surgeon: Margherita Shell, MD;  Location: [facility] OR;  Service: Vascular;  Laterality: Left;   FISTULOGRAM Left 07/16/2017   Procedure: FISTULOGRAM LEFT ARM ARTERIOVENOUS FISTULA;  Surgeon: Margherita Shell, MD;  Location: [facility] OR;  Service: Vascular;  Laterality: Left;   FLEXIBLE SIGMOIDOSCOPY  10/2016   PERIPHERAL VASCULAR BALLOON ANGIOPLASTY Left 04/03/2017   Procedure: PERIPHERAL VASCULAR BALLOON ANGIOPLASTY;  Surgeon: Richrd Char, MD;  Location: [facility] INVASIVE CV LAB;  Service: Cardiovascular;  Laterality: Left;  Arm AV fistula   PERIPHERAL VASCULAR BALLOON ANGIOPLASTY Right 07/07/2023   Procedure: PERIPHERAL VASCULAR BALLOON ANGIOPLASTY;  Surgeon: Adine Hoof, MD;  Location: [facility] INVASIVE CV LAB;  Service: Vascular;  Laterality: Right;   POLYPECTOMY  10/22/2021   Procedure: POLYPECTOMY;  Surgeon: Nannette Babe, MD;  Location: [facility] ENDOSCOPY;  Service: Gastroenterology;;   REVISON OF ARTERIOVENOUS FISTULA Left 07/16/2017   Procedure: PLICATION  OF ARTERIOVENOUS FISTULA  LEFT ARM;  Surgeon: Margherita Shell, MD;  Location: [facility] OR;  Service: Vascular;  Laterality: Left;   VASCULAR SURGERY      Short Social History:  Social History   Tobacco Use   Smoking status: Never    Passive exposure: Never   Smokeless tobacco: Never  Substance Use Topics   Alcohol use: No    Allergies  Allergen Reactions   Naproxen Anaphylaxis and Other (See Comments)    CARDIAC ARREST- heart stopped one time   Lactose Intolerance (Gi) Diarrhea and Other (See Comments)    BLOODY STOOLS   Penicillins Other (See Comments)    UNSPECIFIED REACTION     No current facility-administered medications for this encounter.    REVIEW OF SYSTEMS All other systems were reviewed and are negative     Objective:   Objective   Vitals:   12/30/23  0756  BP: (!) 134/100  Pulse: 79  Resp: 12  Temp: 98 F (36.7 C)  TempSrc: Oral  SpO2: 98%   There is no height or weight on file to calculate BMI.  Physical Exam General: no acute distress Cardiac: hemodynamically stable Extremities: Palpable thrill and right arm radiocephalic fistula  Data: Reviewed fistulogram from December 2024, a 70% stenosis of the upper arm cephalic vein was treated with an 8 mm balloon. He is also known to have a right innominate vein occlusion     Assessment/Plan:   John Woodard is a 53 y.o. male with ESRD presenting for fistulogram.  Having issues with low clearance and flows during sessions. Last HD session yesterday. Reviewed risks and benefits of fistulogram with intervention and patient agreed to proceed.   Delaney Fearing, MD [facility]

## 2023-12-31 ENCOUNTER — Ambulatory Visit (HOSPITAL_COMMUNITY): Admit: 2023-12-31 | Admitting: Cardiovascular Disease

## 2023-12-31 ENCOUNTER — Ambulatory Visit (HOSPITAL_COMMUNITY)
Admission: RE | Admit: 2023-12-31 | Discharge: 2023-12-31 | Disposition: A | Discharge status: Home / Self Care | Source: Ambulatory Visit | Attending: Cardiovascular Disease | Admitting: Cardiovascular Disease

## 2023-12-31 ENCOUNTER — Other Ambulatory Visit (HOSPITAL_COMMUNITY): Payer: Self-pay | Admitting: Cardiovascular Disease

## 2023-12-31 ENCOUNTER — Encounter (HOSPITAL_COMMUNITY): Payer: Self-pay

## 2023-12-31 ENCOUNTER — Encounter (HOSPITAL_COMMUNITY): Payer: Self-pay | Admitting: Vascular Surgery

## 2023-12-31 DIAGNOSIS — I2510 Atherosclerotic heart disease of native coronary artery without angina pectoris: Secondary | ICD-10-CM

## 2023-12-31 SURGERY — LEFT HEART CATH AND CORONARY ANGIOGRAPHY
Anesthesia: LOCAL

## 2024-04-18 ENCOUNTER — Ambulatory Visit: Admitting: Internal Medicine

## 2024-04-18 ENCOUNTER — Encounter: Payer: Self-pay | Admitting: Internal Medicine

## 2024-04-18 VITALS — BP 134/86 | HR 63 | Temp 97.8°F | Resp 16 | Ht 74.0 in | Wt 168.2 lb

## 2024-04-18 DIAGNOSIS — E1142 Type 2 diabetes mellitus with diabetic polyneuropathy: Secondary | ICD-10-CM

## 2024-04-18 DIAGNOSIS — I4891 Unspecified atrial fibrillation: Secondary | ICD-10-CM | POA: Diagnosis not present

## 2024-04-18 DIAGNOSIS — R9431 Abnormal electrocardiogram [ECG] [EKG]: Secondary | ICD-10-CM

## 2024-04-18 DIAGNOSIS — R5382 Chronic fatigue, unspecified: Secondary | ICD-10-CM

## 2024-04-18 NOTE — Progress Notes (Signed)
 Subjective:  Patient ID: John Woodard, male    DOB: 04-30-1971  Age: 53 y.o. MRN: [id number]  CC: Hypertension and Atrial Fibrillation   HPI John Woodard presents for f/up ---  Discussed the use of AI scribe software for clinical note transcription with the patient, who gave verbal consent to proceed.  History of Present Illness John Woodard is a 53 year old male who presents with fatigue and constipation following dialysis.  He experiences fatigue, describing it as feeling 'a little tired.' The fatigue was present during his last dialysis session on Saturday, April 16, 2024. No headaches, blurred vision, chest pain, or shortness of breath.  He also has constipation, noting that using the bathroom requires significant effort.  His last HD treatment was 2 days ago.      Outpatient Medications Prior to Visit  Medication Sig Dispense Refill   amLODipine  (NORVASC ) 5 MG tablet Take 10 mg by mouth at bedtime.     cinacalcet  (SENSIPAR ) 30 MG tablet Take 30 mg by mouth 3 (three) times a week. Tues, Thurs, Sat     cloNIDine  (CATAPRES ) 0.3 MG tablet Take 1 tablet (0.3 mg total) by mouth 3 (three) times daily. 90 tablet 0   Doxercalciferol  (HECTOROL  IV) Doxercalciferol  (Hectorol )     esomeprazole (NEXIUM) 40 MG capsule Take 40 mg by mouth daily before breakfast.     furosemide  (LASIX ) 40 MG tablet Take 40 mg by mouth daily.     hydrALAZINE  (APRESOLINE ) 25 MG tablet Take 25 mg by mouth 2 (two) times daily.     losartan  (COZAAR ) 100 MG tablet Take 100 mg by mouth daily.     Methoxy PEG-Epoetin Beta (MIRCERA IJ) Mircera     metoprolol  succinate (TOPROL -XL) 50 MG 24 hr tablet Take 1 tablet (50 mg total) by mouth at bedtime. Take with or immediately following a meal. 30 tablet 0   multivitamin (RENA-VIT) TABS tablet Take 1 tablet by mouth at bedtime.     sevelamer  carbonate (RENVELA ) 800 MG tablet Take 800-1,600 mg by mouth in the morning, at noon, in the evening, and at bedtime.  Take 2 tablets (1600 mg) three times daily with meals and 1 tablet (800 mg) with one snack.     No facility-administered medications prior to visit.    ROS Review of Systems  Constitutional:  Positive for fatigue. Negative for chills.  HENT:  Positive for hearing loss.   Respiratory: Negative.  Negative for cough, chest tightness, shortness of breath and wheezing.   Cardiovascular:  Negative for chest pain, palpitations and leg swelling.  Gastrointestinal:  Positive for constipation. Negative for abdominal pain, diarrhea, nausea and vomiting.  Genitourinary: Negative.  Negative for difficulty urinating and dysuria.  Musculoskeletal: Negative.   Skin: Negative.   Neurological: Negative.  Negative for weakness.  Hematological:  Negative for adenopathy. Does not bruise/bleed easily.  Psychiatric/Behavioral:  Positive for confusion and decreased concentration. The patient is not nervous/anxious.     Objective:  BP 134/86 (BP Location: Left Arm, Patient Position: Sitting, Cuff Size: Normal)   Pulse 63   Temp 97.8 F (36.6 C) (Oral)   Resp 16   Ht 6' 2 (1.88 m)   Wt 168 lb 3.2 oz (76.3 kg)   SpO2 98%   BMI 21.60 kg/m   BP Readings from Last 3 Encounters:  04/18/24 134/86  12/30/23 (!) 133/108  10/21/23 (!) 179/116    Wt Readings from Last 3 Encounters:  04/18/24 168 lb 3.2 oz (76.3 kg)  10/19/23 184 lb (83.5 kg)  06/26/23 179 lb 6.4 oz (81.4 kg)    Physical Exam Vitals reviewed.  Constitutional:      General: He is not in acute distress.    Appearance: He is ill-appearing. He is not toxic-appearing or diaphoretic.  HENT:     Nose: Nose normal.     Mouth/Throat:     Mouth: Mucous membranes are moist.  Eyes:     General: No scleral icterus.    Conjunctiva/sclera: Conjunctivae normal.  Cardiovascular:     Rate and Rhythm: Normal rate. Rhythm irregularly irregular.     Pulses: Normal pulses.     Heart sounds: No murmur heard.    No friction rub. No gallop.      Comments: EKG--- A fib, 69 bpm LAFB Anterior infarct pattern is more pronounced TWI in V5 V6 Pulmonary:     Effort: Pulmonary effort is normal.     Breath sounds: No stridor. No wheezing, rhonchi or rales.  Abdominal:     General: Abdomen is flat.     Palpations: There is no mass.     Tenderness: There is no abdominal tenderness. There is no guarding.     Hernia: No hernia is present.  Musculoskeletal:     Cervical back: Neck supple.     Right lower leg: No edema.     Left lower leg: No edema.  Skin:    General: Skin is warm and dry.  Neurological:     General: No focal deficit present.     Lab Results  Component Value Date   WBC 5.2 10/22/2021   HGB 10.9 (L) 10/23/2021   HCT 32.0 (L) 10/23/2021   PLT 75 (L) 10/22/2021   GLUCOSE 85 10/23/2021   ALT 16 10/19/2021   AST 20 10/19/2021   NA 138 10/23/2021   K 4.5 10/23/2021   CL 99 10/23/2021   CREATININE 10.50 (H) 10/23/2021   BUN 25 (H) 10/23/2021   CO2 24 10/22/2021   TSH 2.291 10/19/2021   INR 1.0 10/21/2021   HGBA1C 4.3 08/24/2020    No results found.  Assessment & Plan:   Atrial fibrillation, unspecified type (HCC)- He has good rate control. -     EKG 12-Lead -     TSH; Future -     ECHOCARDIOGRAM COMPLETE; Future -     Ambulatory referral to Cardiology  Abnormal electrocardiogram (ECG) (EKG) -     Troponin I (High Sensitivity); Future -     ECHOCARDIOGRAM COMPLETE; Future -     Ambulatory referral to Cardiology  Chronic fatigue -     TSH; Future  Diabetic polyneuropathy associated with type 2 diabetes mellitus (HCC) -     Hemoglobin A1c; Future -     HM Diabetes Foot Exam     Follow-up: Return if symptoms worsen or fail to improve.  Debby Molt, MD

## 2024-04-18 NOTE — Patient Instructions (Signed)
 Atrial Fibrillation Atrial fibrillation (AFib) is a type of irregular or rapid heartbeat (arrhythmia). In AFib, the top part of the heart (atria) beats in an irregular pattern. This makes the heart unable to pump blood normally and effectively. The goal of treatment is to prevent blood clots from forming, control your heart rate, or restore your heartbeat to a normal rhythm. If this condition is not treated, it can cause serious problems, such as a weakened heart muscle (cardiomyopathy) or a stroke. What are the causes? This condition is often caused by medical conditions that damage the heart's electrical system. These include: High blood pressure (hypertension). This is the most common cause. Certain heart problems or conditions, such as heart failure, coronary artery disease, heart valve problems, or heart surgery. Diabetes. Overactive thyroid  (hyperthyroidism). Chronic kidney disease. Certain lung conditions, such as emphysema, pneumonia, or COPD. Obstructive sleep apnea. In some cases, the cause of this condition is not known. What increases the risk? This condition is more likely to develop in: Older adults. Athletes who do endurance exercise. People who have a family history of AFib. Males. People who are Caucasian. People who are obese. People who smoke or misuse alcohol. What are the signs or symptoms? Symptoms of this condition include: Fast or irregular heartbeats (palpitations). Discomfort or pain in your chest. Shortness of breath. Sudden light-headedness or weakness. Tiring easily during exercise or activity. Syncope (fainting). Sweating. In some cases, there are no symptoms. How is this diagnosed? Your health care provider may detect AFib when taking your pulse. If detected, this condition may be diagnosed with: An electrocardiogram (ECG) to check electrical signals of the heart. An ambulatory cardiac monitor to record your heart's activity for a few days. A  transthoracic echocardiogram (TTE) to create pictures of your heart. A transesophageal echocardiogram (TEE) to create even clearer pictures of your heart. A stress test to check your blood supply while you exercise. Imaging tests, such as a CT scan or chest X-ray. Blood tests. How is this treated? Treatment depends on underlying conditions and how you feel when you get AFib. This condition may be treated with: Medicines to prevent blood clots or to treat heart rate or heart rhythm problems. Electrical cardioversion to reset the heart's rhythm. A pacemaker to correct abnormal heart rhythm. Ablation to remove the heart tissue that sends abnormal signals. Left atrial appendage closure to seal the area where blood clots can form. In some cases, underlying conditions will be treated. Follow these instructions at home: Medicines Take over-the counter and prescription medicines only as told by your provider. Do not take any new medicines without talking to your provider. If you are taking blood thinners: Talk with your provider before taking aspirin  or NSAIDs. These medicines can raise your risk of bleeding. Take your medicines as told. Take them at the same time each day. Do not do things that could hurt or bruise you. Be careful to avoid falls. Wear an alert bracelet or carry a card that says that you take blood thinners. Lifestyle Do not use any products that contain nicotine or tobacco. These products include cigarettes, chewing tobacco, and vaping devices, such as e-cigarettes. If you need help quitting, ask your provider. Eat heart-healthy foods. Talk with a food expert (dietitian) to make an eating plan that is right for you. Exercise regularly as told by your provider. Do not drink alcohol. Lose weight if you are overweight. General instructions If you have obstructive sleep apnea, manage your condition as told by your provider.  Do not use diet pills unless your provider approves. Diet  pills can make heart problems worse. Keep all follow-up visits. Your provider will want to check your heart rate and rhythm regularly. Contact a health care provider if: You notice a change in the rate, rhythm, or strength of your heartbeat. You are taking a blood thinner and you notice more bruising. You tire more easily when you exercise or do heavy work. You have a sudden change in weight. Get help right away if:  You have chest pain. You have trouble breathing. You have side effects of blood thinners, such as blood in your vomit, poop (stool), or pee (urine), or bleeding that does not stop. You have any symptoms of a stroke. BE FAST is an easy way to remember the main warning signs of a stroke: B - Balance. Signs are dizziness, sudden trouble walking, or loss of balance. E - Eyes. Signs are trouble seeing or a sudden change in vision. F - Face. Signs are sudden weakness or numbness of the face, or the face or eyelid drooping on one side. A - Arms. Signs are weakness or numbness in an arm. This happens suddenly and usually on one side of the body. S - Speech.Signs are sudden trouble speaking, slurred speech, or trouble understanding what people say. T - Time. Time to call emergency services. Write down what time symptoms started. Other signs of a stroke, such as: A sudden, severe headache with no known cause. Nausea or vomiting. Seizure. These symptoms may be an emergency. Get help right away. Call 911. Do not wait to see if the symptoms will go away. Do not drive yourself to the hospital. This information is not intended to replace advice given to you by your health care provider. Make sure you discuss any questions you have with your health care provider. Document Revised: 03/26/2022 Document Reviewed: 03/26/2022 Elsevier Patient Education  2024 [employer].

## 2024-06-02 ENCOUNTER — Encounter (HOSPITAL_COMMUNITY): Payer: Self-pay | Admitting: Internal Medicine
